# Patient Record
Sex: Female | Born: 1945
Health system: Southern US, Community
[De-identification: ages and names within clinical notes are randomized; demographics above are authoritative.]

## PROBLEM LIST (undated history)

## (undated) DIAGNOSIS — S62109A Fracture of unspecified carpal bone, unspecified wrist, initial encounter for closed fracture: Secondary | ICD-10-CM

## (undated) DIAGNOSIS — Z5189 Encounter for other specified aftercare: Secondary | ICD-10-CM

## (undated) DIAGNOSIS — S32030A Wedge compression fracture of third lumbar vertebra, initial encounter for closed fracture: Secondary | ICD-10-CM

## (undated) DIAGNOSIS — K449 Diaphragmatic hernia without obstruction or gangrene: Secondary | ICD-10-CM

## (undated) DIAGNOSIS — G4730 Sleep apnea, unspecified: Secondary | ICD-10-CM

## (undated) DIAGNOSIS — J302 Other seasonal allergic rhinitis: Secondary | ICD-10-CM

## (undated) DIAGNOSIS — I10 Essential (primary) hypertension: Secondary | ICD-10-CM

## (undated) DIAGNOSIS — K219 Gastro-esophageal reflux disease without esophagitis: Secondary | ICD-10-CM

## (undated) DIAGNOSIS — E785 Hyperlipidemia, unspecified: Secondary | ICD-10-CM

## (undated) DIAGNOSIS — Z8719 Personal history of other diseases of the digestive system: Secondary | ICD-10-CM

## (undated) DIAGNOSIS — M1990 Unspecified osteoarthritis, unspecified site: Secondary | ICD-10-CM

## (undated) DIAGNOSIS — R634 Abnormal weight loss: Secondary | ICD-10-CM

## (undated) DIAGNOSIS — M797 Fibromyalgia: Secondary | ICD-10-CM

## (undated) DIAGNOSIS — IMO0001 Reserved for inherently not codable concepts without codable children: Secondary | ICD-10-CM

## (undated) DIAGNOSIS — Z9289 Personal history of other medical treatment: Secondary | ICD-10-CM

## (undated) DIAGNOSIS — K429 Umbilical hernia without obstruction or gangrene: Secondary | ICD-10-CM

## (undated) HISTORY — DX: Unspecified osteoarthritis, unspecified site: M19.90

## (undated) HISTORY — PX: OTHER SURGICAL HISTORY: SHX169

## (undated) HISTORY — DX: Umbilical hernia without obstruction or gangrene: K42.9

## (undated) HISTORY — DX: Fracture of unspecified carpal bone, unspecified wrist, initial encounter for closed fracture: S62.109A

## (undated) HISTORY — PX: CHOLECYSTECTOMY: SHX55

## (undated) HISTORY — PX: KNEE ARTHROPLASTY: SHX992

## (undated) HISTORY — DX: Wedge compression fracture of third lumbar vertebra, initial encounter for closed fracture: S32.030A

## (undated) HISTORY — DX: Abnormal weight loss: R63.4

## (undated) HISTORY — PX: DILATION AND CURETTAGE OF UTERUS: SHX78

## (undated) HISTORY — PX: ANKLE SURGERY: SHX546

## (undated) HISTORY — DX: Essential (primary) hypertension: I10

## (undated) HISTORY — PX: JOINT REPLACEMENT: SHX530

## (undated) HISTORY — DX: Hyperlipidemia, unspecified: E78.5

---

## 1999-04-02 ENCOUNTER — Encounter: Admission: RE | Admit: 1999-04-02 | Discharge: 1999-05-01 | Payer: Self-pay | Admitting: Orthopaedic Surgery

## 1999-04-16 ENCOUNTER — Other Ambulatory Visit: Admission: RE | Admit: 1999-04-16 | Discharge: 1999-04-16 | Payer: Self-pay | Admitting: Obstetrics and Gynecology

## 1999-04-16 ENCOUNTER — Encounter (INDEPENDENT_AMBULATORY_CARE_PROVIDER_SITE_OTHER): Payer: Self-pay

## 2000-09-20 ENCOUNTER — Other Ambulatory Visit: Admission: RE | Admit: 2000-09-20 | Discharge: 2000-09-20 | Payer: Self-pay | Admitting: Obstetrics and Gynecology

## 2003-02-14 ENCOUNTER — Other Ambulatory Visit: Admission: RE | Admit: 2003-02-14 | Discharge: 2003-02-14 | Payer: Self-pay | Admitting: Obstetrics and Gynecology

## 2004-10-10 ENCOUNTER — Other Ambulatory Visit: Admission: RE | Admit: 2004-10-10 | Discharge: 2004-10-10 | Payer: Self-pay | Admitting: Obstetrics and Gynecology

## 2004-12-19 ENCOUNTER — Ambulatory Visit (HOSPITAL_COMMUNITY): Admission: RE | Admit: 2004-12-19 | Discharge: 2004-12-19 | Payer: Self-pay | Admitting: Gastroenterology

## 2004-12-19 LAB — HM COLONOSCOPY: HM COLON: NORMAL

## 2006-03-18 ENCOUNTER — Ambulatory Visit (HOSPITAL_COMMUNITY): Admission: RE | Admit: 2006-03-18 | Discharge: 2006-03-19 | Payer: Self-pay | Admitting: Orthopaedic Surgery

## 2007-12-06 ENCOUNTER — Other Ambulatory Visit: Admission: RE | Admit: 2007-12-06 | Discharge: 2007-12-06 | Payer: Self-pay | Admitting: Obstetrics & Gynecology

## 2009-03-28 ENCOUNTER — Encounter: Payer: Self-pay | Admitting: Pulmonary Disease

## 2010-04-10 ENCOUNTER — Encounter: Payer: Self-pay | Admitting: Pulmonary Disease

## 2010-04-10 ENCOUNTER — Encounter: Payer: Self-pay | Admitting: Internal Medicine

## 2010-04-10 ENCOUNTER — Ambulatory Visit (HOSPITAL_BASED_OUTPATIENT_CLINIC_OR_DEPARTMENT_OTHER)
Admission: RE | Admit: 2010-04-10 | Discharge: 2010-04-10 | Payer: Self-pay | Source: Home / Self Care | Attending: Rheumatology | Admitting: Rheumatology

## 2010-05-07 ENCOUNTER — Observation Stay (HOSPITAL_COMMUNITY)
Admission: EM | Admit: 2010-05-07 | Discharge: 2010-05-08 | Disposition: A | Discharge status: Home / Self Care | Payer: BC Managed Care – PPO | Attending: Surgery | Admitting: Surgery

## 2010-05-07 ENCOUNTER — Other Ambulatory Visit: Payer: Self-pay | Admitting: Surgery

## 2010-05-07 ENCOUNTER — Emergency Department (HOSPITAL_COMMUNITY): Payer: BC Managed Care – PPO

## 2010-05-07 DIAGNOSIS — IMO0001 Reserved for inherently not codable concepts without codable children: Secondary | ICD-10-CM | POA: Insufficient documentation

## 2010-05-07 DIAGNOSIS — G4733 Obstructive sleep apnea (adult) (pediatric): Secondary | ICD-10-CM | POA: Insufficient documentation

## 2010-05-07 DIAGNOSIS — K3580 Unspecified acute appendicitis: Principal | ICD-10-CM | POA: Insufficient documentation

## 2010-05-07 DIAGNOSIS — K219 Gastro-esophageal reflux disease without esophagitis: Secondary | ICD-10-CM | POA: Insufficient documentation

## 2010-05-07 DIAGNOSIS — I10 Essential (primary) hypertension: Secondary | ICD-10-CM | POA: Insufficient documentation

## 2010-05-07 DIAGNOSIS — Z79899 Other long term (current) drug therapy: Secondary | ICD-10-CM | POA: Insufficient documentation

## 2010-05-07 LAB — DIFFERENTIAL
Basophils Relative: 0 % (ref 0–1)
Eosinophils Absolute: 0.1 10*3/uL (ref 0.0–0.7)
Eosinophils Relative: 1 % (ref 0–5)
Lymphocytes Relative: 14 % (ref 12–46)
Lymphs Abs: 1.8 10*3/uL (ref 0.7–4.0)
Neutro Abs: 10.3 10*3/uL — ABNORMAL HIGH (ref 1.7–7.7)

## 2010-05-07 LAB — URINALYSIS, ROUTINE W REFLEX MICROSCOPIC
Hgb urine dipstick: NEGATIVE
Protein, ur: NEGATIVE mg/dL
Urine Glucose, Fasting: NEGATIVE mg/dL
Urobilinogen, UA: 1 mg/dL (ref 0.0–1.0)
pH: 6.5 (ref 5.0–8.0)

## 2010-05-07 LAB — BASIC METABOLIC PANEL
Chloride: 103 mEq/L (ref 96–112)
Creatinine, Ser: 0.76 mg/dL (ref 0.4–1.2)
GFR calc Af Amer: >60 mL/min (ref >60)
Glucose, Bld: 126 mg/dL — ABNORMAL HIGH (ref 70–99)
Potassium: 4 mEq/L (ref 3.5–5.1)
Sodium: 140 mEq/L (ref 135–145)

## 2010-05-07 LAB — HEPATIC FUNCTION PANEL
AST: 201 U/L — ABNORMAL HIGH (ref 0–37)
Alkaline Phosphatase: 97 U/L (ref 39–117)
Total Bilirubin: 0.4 mg/dL (ref 0.3–1.2)
Total Protein: 6.5 g/dL (ref 6.0–8.3)

## 2010-05-07 LAB — CBC
Hemoglobin: 11.1 g/dL — ABNORMAL LOW (ref 12.0–15.0)
Platelets: 406 10*3/uL — ABNORMAL HIGH (ref 150–400)
RBC: 3.94 MIL/uL (ref 3.87–5.11)
RDW: 15.2 % (ref 11.5–15.5)
WBC: 13 10*3/uL — ABNORMAL HIGH (ref 4.0–10.5)

## 2010-05-07 MED ORDER — IOHEXOL 300 MG/ML  SOLN
100.0000 mL | Freq: Once | INTRAMUSCULAR | Status: AC | PRN
  Administered 2010-05-07: 100 mL via INTRAVENOUS

## 2010-05-10 NOTE — H&P (Signed)
NAME:  Miranda Romero, Miranda Romero            ACCOUNT NO.:  [id number]  MEDICAL RECORD NO.:  [id number]           PATIENT TYPE:  E  LOCATION:  [facility]                         FACILITY:  [facility]  PHYSICIAN:  Abigail Miyamoto, M.D. DATE OF BIRTH:  07-12-1945  DATE OF ADMISSION:  05/07/2010 DATE OF DISCHARGE:                             HISTORY & PHYSICAL   CHIEF COMPLAINT:  Abdominal pain.  REQUESTING PHYSICIAN:  Orlene Och, MD, ER  PRIMARY CARE PHYSICIAN:  Gaspar Garbe, MD  BRIEF HISTORY:  The patient is a 65 year old female who developed right lower quadrant pain after getting off her exercise bike last evening. She noted pain with walking, deep inspiration, or changing her position. She describes it as a sharp pain, it lasted 5 hours.  Nothing really helped except lying still.  She has never had any problems like this before.  No nausea, vomiting, or diarrhea.  She had a bowel movement, last BM around 7:30 or 8 o'clock after the pain started, did not make her symptoms any better, questionably little more discomfort. Currently, she continues to have discomfort, although has been relieved with Dilaudid.  PAST MEDICAL HISTORY: 1. Obstructive sleep apnea study April 10, 2010, positive for apnea. 2. Status post cholecystectomy 15 years ago. 3. Fibromyalgia. 4. Hypertension. 5. GERD. 6. Seasonal allergies. 7. She has history of bladder issues. 8. Osteoarthritis. 9. Depression. 10.BMI of 35.5, she is 6 feet and 3 inches tall, and 220 pounds.  PAST SURGICAL HISTORY: 1. Bimalleolar left ankle fracture repair. 2. Colonoscopy October 2006 shows internal hemorrhoids, early sigmoid     diverticulosis, normal transverse right colon, and cecum. 3. She is status post 2 bilateral knee replacements last year and she     is still using physical therapy for at least one.  FAMILY HISTORY:  Mother died at 3 with lymphoma.  Father died at 3, not sure, but then he was a heavy smoker.   Brother is in good health. One sister and both siblings are in their 63s.  She has 2 children in good health.  SOCIAL HISTORY:  Tobacco:  None.  Alcohol:  None.  Drugs:  None.  She is a retired Doctor, general practice.  Married. Lives with her husband.  He is with her currently.  REVIEW OF SYSTEMS:  FEVER:  None.  SKIN CHANGES:  None.  Weight down secondary to exercise and diet.  CEREBROVASCULAR:  Negative.  PULMONARY: She sleeps poorly.  Has sleep apnea.  Has completed the study.  She has not talked to anyone about the results of the study.  No dyspnea on exertion.  No recent URI.  No coughing or wheezing.  CARDIAC:  No history of prior chest pain or palpitation.  She has never had a cardiac evaluation.  GI:  Negative for GERD.  No nausea, vomiting, diarrhea, constipation, or blood.  GU:  Negative.  LOWER EXTREMITIES:  She gets some edema, usually goes down at night.  Both knees hurt. MUSCULOSKELETAL:  Positive for osteoarthritis.  PSYCH:  No changes.  CURRENT MEDICATIONS:  Caduet, gabapentin, Nexium, aspirin, Vesicare, Cymbalta, CoQ10, calcium, vitamin D, Ultram; doses are not currently available.  ALLERGIES:  None.  She is intolerant to SUDAFED, it makes her hyper.  PHYSICAL EXAMINATION:  VITAL SIGNS:  On admission, her temperature is 98.2, blood pressure is 139/94, respiratory rate was 18, last blood pressure 131/84, temperature remains at 98.8, heart rate 96, respiratory rate is 20.  She has had two doses of Dilaudid, one dose of Zofran, and one dose of Toradol. GENERAL:  Well-nourished, well-developed white female.  She has pain with movement, but is in no acute distress, also pain with deep inspiration on exam. HEAD:  Normocephalic. EARS, NOSE, THROAT, AND MOUTH:  Within normal limits.  Normal mucosa. NECK:  Trachea is in the midline.  There is no palpable thyromegaly.  No bruits.  No JVD. RESPIRATORY:  Normal effort. CHEST:  Clear to auscultation.  Nontender to  palpation. CARDIAC:  Normal S1 and S2.  No murmur or rub was heard. EXTREMITIES:  Pulses are +2 and equal both upper and lower extremities. ABDOMEN:  Normal bowel sounds, nondistended, nontender.  No hernias, no masses. GENITALIA:  Deferred. RECTAL:  Deferred. MUSCULOSKELETAL:  Normal muscle tone.  Status post bilateral knee replacement. SKIN:  No changes. NEUROLOGIC:  No focal deficits.  Cranial nerves appear intact. PSYCH:  Normal affect.  LABORATORY DATA:  Sodium is 140, potassium is 4, chloride is 103, CO2 is 29, BUN is 15, creatinine 0.76, glucose is 126.  White count is 13,000, hemoglobin 11, hematocrit 33.9, platelets were normal at 406.  UA was negative.  Protime, PTT, amylase, lipase, and liver functions are pending.  CT exam shows inflammation right lower quadrant, appendix is dilated at 13 mm with several appendicoliths.  There is peripheral stranding.  No abscess was seen.  There is also a small hiatal hernia.  IMPRESSION: 1. Acute appendicitis with elevated white count. 2. Obstructive sleep apnea. 3. Fibromyalgia. 4. Hypertension. 5. Gastroesophageal reflux disease. 6. Osteoarthritis both knees. 7. Depression. 8. BMI of 35.5.  PLAN:  The patient was seen and evaluated by Dr. Magnus Ivan.  She was started on antibiotics and we will schedule for an elective appendectomy hopefully done laparoscopically later today.     Eber Hong, P.A.   ______________________________ Abigail Miyamoto, M.D.    WDJ/MEDQ  D:  05/07/2010  T:  05/07/2010  Job:  [id number]  cc:   Gaspar Garbe, M.D.  Electronically Signed by Sherrie George P.A. on 05/09/2010 10:55:23 AM Electronically Signed by Abigail Miyamoto M.D. on 05/10/2010 04:08:54 PM

## 2010-05-10 NOTE — Op Note (Signed)
  NAME:  [name], Miranda            ACCOUNT NO.:  [id number]  MEDICAL RECORD NO.:  [id number]           PATIENT TYPE:  I  LOCATION:  5125                         FACILITY:  [facility]  PHYSICIAN:  Abigail Miyamoto, M.D. DATE OF BIRTH:  1945-07-22  DATE OF PROCEDURE:  05/07/2010 DATE OF DISCHARGE:                              OPERATIVE REPORT   PREOPERATIVE DIAGNOSIS:  Acute appendicitis.  POSTOPERATIVE DIAGNOSIS:  Acute appendicitis.  PROCEDURE:  Laparoscopic appendectomy.  SURGEON:  Abigail Miyamoto, MD  ANESTHESIA:  General endotracheal anesthesia.  ESTIMATED BLOOD LOSS:  Minimal.  FINDINGS:  The patient found to have acute appendicitis which was slightly retrocecal.  There was no evidence of perforation.  PROCEDURE IN DETAIL:  The patient was brought to the operating room, identified as Miranda Romero.  She was placed supine on the operating table and general anesthesia was induced.  Her abdomen was then prepped and draped in usual sterile fashion.  Using a #15 blade, a small vertical incision was made above the umbilicus.  This was carried down to the fascia which was then opened with a scalpel.  Hemostat was then used to pass through the peritoneal cavity under direct vision.  Next, a 5-mm port was placed in the patient's right upper quadrant and another was placed in the patient's left lower quadrant, both under direct vision.  The appendix was found to be slightly retrocecal but was stuck to the abdominal wall.  I was able to easily peel it off the abdominal wall.  The appendix came off the base of the cecum and then curved retrocecal.  I was able to actually elevate the base of the appendix first and transect this with the endoscopic GIA stapler.  I then took down the mesoappendix off the side of the colon with the Harmonic scalpel.  Once the appendix was completely free, it was placed in an endosac and removed it through the incision at the umbilicus.  I  then thoroughly irrigated the right lower quadrant with normal saline.  I evaluated the mesentery and staple line and hemostasis appeared to be achieved.  All ports were removed under direct vision and the abdomen was deflated.  All incisions were then anesthetized with Marcaine.  The 0 Vicryl at the umbilicus was tied in place closing the fascial defect. All incisions were then closed with 4-0 Monocryl subcuticular sutures.  Steri-Strips and Band-Aids were then applied. The patient tolerated the procedure well.  All counts were correct at the end of the procedure.  The patient was then extubated in the operating room and taken in stable condition to recovery room.     Abigail Miyamoto, M.D.     DB/MEDQ  D:  05/07/2010  T:  05/08/2010  Job:  [id number]  Electronically Signed by Abigail Miyamoto M.D. on 05/10/2010 04:08:57 PM

## 2010-05-14 ENCOUNTER — Encounter: Payer: Self-pay | Admitting: Pulmonary Disease

## 2010-05-14 ENCOUNTER — Institutional Professional Consult (permissible substitution) (INDEPENDENT_AMBULATORY_CARE_PROVIDER_SITE_OTHER): Payer: BC Managed Care – PPO | Admitting: Pulmonary Disease

## 2010-05-14 DIAGNOSIS — G4730 Sleep apnea, unspecified: Secondary | ICD-10-CM

## 2010-05-14 DIAGNOSIS — I10 Essential (primary) hypertension: Secondary | ICD-10-CM | POA: Insufficient documentation

## 2010-05-21 ENCOUNTER — Telehealth (INDEPENDENT_AMBULATORY_CARE_PROVIDER_SITE_OTHER): Payer: Self-pay | Admitting: *Deleted

## 2010-05-22 NOTE — Assessment & Plan Note (Signed)
Summary: sleep apnea//Miranda Romero   Visit Type:  Initial Consult Primary Provider/Referring Provider:  Dr. Wylene Simmer  CC:  Pt here to go over sleep study results.  History of Present Illness: 65/F for evaluation of obstructive sleep apnea  She sees Dr Corliss Skains at [facility] -fibromyalgia & BL knee replacements witnessed apneas by husband , more on her back, ER visit for appendicitis , O2 satn dropped to 65% Nocturia +, excessive daytime somnolence & fatigue Epworth Sleepiness Score 6/24 Some sleep onset insomnia, has used ambien in the past, bedtime MN, oob 1000 , 2-3 awakenings No recent weight gain Reviewed PSG >> showed severe obstructive sleep apnea with AHi 72/h, predominant hypopneas , desatn to 80%, significant PLMs assocaited with arousals There is no history suggestive of cataplexy, sleep paralysis or parasomnias   Preventive Screening-Counseling & Management  Alcohol-Tobacco     Smoking Status: never   History of Present Illness: awake 2-3 times a night, snoring  What time do you typically go to bed?(between what hours): 12:00  How long does it take you to fall asleep? 15-20 minutes  How many times during the night do you wake up? 2-5 times  What time do you get out of bed to start your day? 9:30-10:00  Do you drive or operate heavy machinery in your occupation? no  How much has your weight changed (up or down) over the past two years? (in pounds): hasn't  Have you ever had a sleep study before?  If yes,when and where: Yes  Do you currently use CPAP ? If so , at what pressure? no  Do you wear oxygen at any time? If yes, how many liters per minute? no Current Medications (verified): 1)  Allegra Allergy 180 Mg Tabs (Fexofenadine Hcl) .... Take 1 Tablet By Mouth Once A Day 2)  Cymbalta 30 Mg Cpep (Duloxetine Hcl) .... Take 1 Tablet By Mouth Once A Day 3)  Gabapentin 300 Mg Caps (Gabapentin) .... Take 1 Tablet By Mouth Once A Day 4)  Celebrex 200 Mg Caps (Celecoxib) .... Take 1  Tablet By Mouth Once A Day 5)  Nexium 40 Mg Cpdr (Esomeprazole Magnesium) .... Take 1 Tablet By Mouth Once A Day 6)  Tramadol Hcl 50 Mg Tabs (Tramadol Hcl) .... Take 2 Tablet By Mouth Every Morning 7)  Vesicare 5 Mg Tabs (Solifenacin Succinate) .... Take 1 Tablet By Mouth Once A Day 8)  Caduet 10-10 Mg Tabs (Amlodipine-Atorvastatin) .... Take 1 Tablet By Mouth Once A Day 9)  Calcium 500 Mg Tabs (Calcium) .... Take 1 Tablet By Mouth Three Times A Day 10)  Vitamin D 400 Unit Caps (Cholecalciferol) .... Take 1 Tablet By Mouth Once A Day 11)  Magnesium 500 Mg Tabs (Magnesium) .... Take 1 Tablet By Mouth Three Times A Day 12)  Vitamin B-1 100 Mg Tabs (Thiamine Hcl) .... Take 1 Tablet By Mouth Once A Day 13)  Coq-10 30 Mg Caps (Coenzyme Q10) .... 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  Smoking Status:  never  Review of Systems       The patient complains of acid heartburn, indigestion, difficulty swallowing,  hand/feet swelling, and joint stiffness or pain.  The patient denies shortness of breath with activity, shortness of breath at rest, productive cough, non-productive cough, coughing up blood, chest pain, irregular heartbeats, loss of appetite, weight change, abdominal pain, sore throat, tooth/dental problems, headaches, nasal congestion/difficulty breathing through nose, sneezing, itching, ear ache, anxiety, depression, rash, change in color of mucus, and fever.      Vital Signs:  Patient profile:   65 year old female Height:      66 inches Weight:      223.6 pounds BMI:     36.22 O2 Sat:      98 % on Room air Temp:     98.2 degrees F oral Pulse rate:   86 / minute BP sitting:   106 / 62  (left arm) Cuff size:   large  Vitals Entered By: Zackery Barefoot CMA (May 14, 2010 3:30 PM)  O2 Flow:  Room air CC: Pt here to go over sleep study results Comments Medications reviewed with patient Verified contact number and pharmacy with patient Zackery Barefoot CMA  May 14, 2010 3:30 PM    Physical Exam  Additional Exam:  Gen. Pleasant, well-nourished, in no distress, normal affect ENT - no lesions, no post nasal drip, class 2 airway Neck: No JVD, no thyromegaly, no carotid bruits Lungs: no use of accessory muscles, no dullness to percussion, clear without rales or rhonchi  Cardiovascular: Rhythm regular, heart sounds  normal, no murmurs or gallops, no peripheral edema Abdomen: soft and non-tender, no hepatosplenomegaly, BS normal. Musculoskeletal: No deformities, no cyanosis or clubbing Neuro:  alert, non focal     Impression & Recommendations:  Problem # 1:  OBSTRUCTIVE SLEEP APNEA (ICD-780.57) The pathophysiology of obstructive sleep apnea, it's cardiovascular consequences and modes of treatment including CPAP were discussed with the patient in great detail. Given severity of obstructive sleep apnea , will initiate autoCPAP 5-10 cm with nasal pillows & humidity, obtain download in 2 weeks & titrate up if needed Due to presence of leg movements & to obtai optimal titration, will also schedule in lab CPAP titration Compliance encouraged, wt loss emphasized, asked to avoid meds with sedative side effects, cautioned against driving when sleepy.  Nocturia may be related Orders: Consultation Level IV (16109) Sleep Study (Sleep Study) DME Referral (DME)  Medications Added to Medication List This Visit: 1)  Allegra Allergy 180 Mg Tabs  (Fexofenadine hcl) .... Take 1 tablet by mouth once a day 2)  Cymbalta 30 Mg Cpep (Duloxetine hcl) .... Take 1 tablet by mouth once a day 3)  Gabapentin 300 Mg Caps (Gabapentin) .... Take 1 tablet by mouth once a day 4)  Celebrex 200 Mg Caps (Celecoxib) .... Take 1 tablet by mouth once a day 5)  Nexium 40 Mg Cpdr (Esomeprazole magnesium) .... Take 1 tablet by mouth once a day 6)  Tramadol Hcl 50 Mg Tabs (Tramadol hcl) .... Take 2 tablet by mouth every morning 7)  Vesicare 5 Mg Tabs (Solifenacin succinate) .... Take 1 tablet by mouth once a day 8)  Caduet 10-10 Mg Tabs (Amlodipine-atorvastatin) .... Take 1 tablet by mouth once a day 9)  Calcium 500 Mg Tabs (Calcium) .... Take 1 tablet by mouth three times a day 10)  Vitamin D 400 Unit Caps (Cholecalciferol) .... Take 1 tablet by mouth once a day 11)  Magnesium 500 Mg Tabs (Magnesium) .... Take 1 tablet by mouth three times a day 12)  Vitamin B-1 100 Mg Tabs (Thiamine hcl) .Marland KitchenMarland KitchenMarland Kitchen  Take 1 tablet by mouth once a day 13)  Coq-10 30 Mg Caps (Coenzyme q10) .... 160mg  daily  Patient Instructions: 1)  Please schedule a follow-up appointment in 1 month 2)  CPAP titration study  3)  Will set you up with an autoCPAP   Immunization History:  Influenza Immunization History:    Influenza:  historical (11/18/2009)  Pneumovax Immunization History:    Pneumovax:  historical (04/16/2008)

## 2010-05-27 NOTE — Letter (Signed)
Summary: [facility]  [facility]   Imported By: Sherian Rein 05/23/2010 07:29:20  _____________________________________________________________________  External Attachment:    Type:   Image     Comment:   External Document

## 2010-05-27 NOTE — Progress Notes (Signed)
Summary: has some medicare questions for libbby  Phone Note Call from Patient   Caller: Patient Call For: ALVA Summary of Call: Ms Prien phoned and wanted to speak to [facility] regarding some Medicare questions. She can be reached at [phone] Initial call taken by: Vedia Coffer,  May 21, 2010 10:22 AM  Follow-up for Phone Call        discussed with pt bcbs vs medicare guidelines for osa  Follow-up by: Oneita Jolly,  May 21, 2010 12:49 PM

## 2010-05-28 ENCOUNTER — Ambulatory Visit (HOSPITAL_BASED_OUTPATIENT_CLINIC_OR_DEPARTMENT_OTHER): Payer: BC Managed Care – PPO | Attending: Pulmonary Disease

## 2010-05-28 DIAGNOSIS — Z96659 Presence of unspecified artificial knee joint: Secondary | ICD-10-CM | POA: Insufficient documentation

## 2010-05-28 DIAGNOSIS — G4761 Periodic limb movement disorder: Secondary | ICD-10-CM | POA: Insufficient documentation

## 2010-05-28 DIAGNOSIS — IMO0001 Reserved for inherently not codable concepts without codable children: Secondary | ICD-10-CM | POA: Insufficient documentation

## 2010-05-28 DIAGNOSIS — R0989 Other specified symptoms and signs involving the circulatory and respiratory systems: Secondary | ICD-10-CM | POA: Insufficient documentation

## 2010-05-28 DIAGNOSIS — Z79899 Other long term (current) drug therapy: Secondary | ICD-10-CM | POA: Insufficient documentation

## 2010-05-28 DIAGNOSIS — G4733 Obstructive sleep apnea (adult) (pediatric): Secondary | ICD-10-CM | POA: Insufficient documentation

## 2010-05-28 DIAGNOSIS — R0609 Other forms of dyspnea: Secondary | ICD-10-CM | POA: Insufficient documentation

## 2010-06-02 DIAGNOSIS — R0609 Other forms of dyspnea: Secondary | ICD-10-CM

## 2010-06-02 DIAGNOSIS — G4733 Obstructive sleep apnea (adult) (pediatric): Secondary | ICD-10-CM

## 2010-06-02 DIAGNOSIS — G4761 Periodic limb movement disorder: Secondary | ICD-10-CM

## 2010-06-02 DIAGNOSIS — R0989 Other specified symptoms and signs involving the circulatory and respiratory systems: Secondary | ICD-10-CM

## 2010-06-02 DIAGNOSIS — IMO0001 Reserved for inherently not codable concepts without codable children: Secondary | ICD-10-CM

## 2010-06-10 ENCOUNTER — Telehealth: Payer: Self-pay | Admitting: Pulmonary Disease

## 2010-06-10 DIAGNOSIS — G4733 Obstructive sleep apnea (adult) (pediatric): Secondary | ICD-10-CM

## 2010-06-10 NOTE — Telephone Encounter (Signed)
Pl let her know  2 week download shows good control of events >> change to fixed pressure 12 cm & rpt download in 4 weeks

## 2010-06-11 ENCOUNTER — Encounter: Payer: Self-pay | Admitting: Pulmonary Disease

## 2010-06-13 NOTE — Telephone Encounter (Signed)
Pt informed of RA's recs. Hubert Raatz W. Jashan Cotten, CMA  

## 2010-06-16 ENCOUNTER — Ambulatory Visit (INDEPENDENT_AMBULATORY_CARE_PROVIDER_SITE_OTHER): Payer: BC Managed Care – PPO | Admitting: Pulmonary Disease

## 2010-06-16 ENCOUNTER — Other Ambulatory Visit (INDEPENDENT_AMBULATORY_CARE_PROVIDER_SITE_OTHER): Payer: BC Managed Care – PPO

## 2010-06-16 ENCOUNTER — Other Ambulatory Visit (INDEPENDENT_AMBULATORY_CARE_PROVIDER_SITE_OTHER): Payer: BC Managed Care – PPO | Admitting: Pulmonary Disease

## 2010-06-16 ENCOUNTER — Encounter: Payer: Self-pay | Admitting: Pulmonary Disease

## 2010-06-16 VITALS — BP 104/70 | HR 104 | Temp 98.9°F | Ht 66.0 in | Wt 225.2 lb

## 2010-06-16 DIAGNOSIS — G2581 Restless legs syndrome: Secondary | ICD-10-CM

## 2010-06-16 DIAGNOSIS — G4730 Sleep apnea, unspecified: Secondary | ICD-10-CM

## 2010-06-16 LAB — IBC PANEL
Saturation Ratios: 8.2 % — ABNORMAL LOW (ref 20.0–50.0)
Transferrin: 253.3 mg/dL (ref 212.0–360.0)

## 2010-06-16 LAB — FERRITIN: Ferritin: 58.9 ng/mL (ref 10.0–291.0)

## 2010-06-16 NOTE — Progress Notes (Signed)
  Subjective:    Patient ID: Miranda Romero, female    DOB: April 26, 1945, 65 y.o.   MRN: [id number]  HPI 64/F for FU of obstructive sleep apnea  She sees Dr Corliss Skains at [facility] -fibromyalgia & BL knee replacements  witnessed apneas by husband , more on her back, ER visit for appendicitis , O2 satn dropped to 65%  Nocturia +, excessive daytime somnolence & fatigue  Epworth Sleepiness Score 6/24  Some sleep onset insomnia, has used ambien in the past, bedtime MN, oob 1000 , 2-3 awakenings  No recent weight gain  Reviewed PSG >> showed severe obstructive sleep apnea with AHi 72/h, predominant hypopneas , desatn to 80%, significant PLMs assocaited with arousals  06/16/2010 Started on autoCPAP 5-10 >>2 week download shows good control of events >> avg pressure 12 cm  Surprisingly PSG showed good control on 7 cm, 1 L O2 was blended due to persistent desatn without resp events. Significant PLMs associated with arousals were noted during both baseline & titration study fe level & satn low although ferritin ok She has tolerated CPAP well, Not fully refreshed but decreased somnolence       Review of Systems Pt denies any significant  nasal congestion or excess secretions, fever, chills, sweats, unintended wt loss, pleuritic or exertional cp, orthopnea pnd or leg swelling.  Pt also denies any obvious fluctuation in symptoms with weather or environmental change or other alleviating or aggravating factors.    Pt denies any increase in rescue therapy over baseline, denies waking up needing it or having early am exacerbations or coughing/wheezing/ or dyspnea       Objective:   Physical ExamGen. Pleasant, well-nourished, in no distress ENT - no lesions, no post nasal drip Neck: No JVD, no thyromegaly, no carotid bruits Lungs: no use of accessory muscles, no dullness to percussion, clear without rales or rhonchi  Cardiovascular: Rhythm regular, heart sounds  normal, no murmurs or gallops, no  peripheral edema Musculoskeletal: No deformities, no cyanosis or clubbing          Assessment & Plan:

## 2010-06-16 NOTE — Assessment & Plan Note (Addendum)
Surprisingly, CPAP titration study showed requirement of 7 cm with 1 L O2 2 week download suggests 12 cm Await 4 wk download before changing to fixed pressure, will initiate O2

## 2010-06-16 NOTE — Patient Instructions (Signed)
Blood work for iron levels Download in 4 weeks - change to fixed pressure We will send in orders to [facility] - to add oxygen to your CPAP machine

## 2010-06-25 ENCOUNTER — Telehealth: Payer: Self-pay | Admitting: Pulmonary Disease

## 2010-06-25 NOTE — Telephone Encounter (Signed)
Spoke w/ pt and she states she has not heard from anyone regarding her o2 w/ her cpap. Spoke w/ [facility] and they state that someone should be contacting her no later than today about getting this set up. Advised pt of this and she states nothing else was needed

## 2010-06-26 ENCOUNTER — Encounter: Payer: Self-pay | Admitting: Pulmonary Disease

## 2010-06-27 ENCOUNTER — Telehealth: Payer: Self-pay | Admitting: Pulmonary Disease

## 2010-06-27 NOTE — Telephone Encounter (Signed)
Pt aware the oximerty results on npsg was over 65days old and can not be used to qualify the 02 so she needs to do the ono to get qyalified for the 02 [facility]

## 2010-06-27 NOTE — Telephone Encounter (Signed)
Pt states she still has not received O2 for cpap but did receive oximeter to qualify her for O2. I will send to [facility] so they may call [facility] regarding this matter. Pt needs 1 lpm bled into cpap and 4 week download.

## 2010-07-07 ENCOUNTER — Telehealth: Payer: Self-pay | Admitting: Pulmonary Disease

## 2010-07-07 NOTE — Telephone Encounter (Signed)
Reviewed ONO on CPAP 07/01/10 >> less than 5 min saturation < 88% OK to observe without O2 (on CPAP alone) & see how much improvement

## 2010-07-09 ENCOUNTER — Encounter: Payer: Self-pay | Admitting: Pulmonary Disease

## 2010-07-11 ENCOUNTER — Other Ambulatory Visit: Payer: Self-pay | Admitting: Gastroenterology

## 2010-07-11 DIAGNOSIS — K219 Gastro-esophageal reflux disease without esophagitis: Secondary | ICD-10-CM

## 2010-07-15 ENCOUNTER — Telehealth: Payer: Self-pay | Admitting: Pulmonary Disease

## 2010-07-15 NOTE — Telephone Encounter (Signed)
Pt made aware and had verbal understanding.

## 2010-07-15 NOTE — Telephone Encounter (Signed)
No new recommendations No other meds required

## 2010-07-15 NOTE — Telephone Encounter (Signed)
Oretha Milch., MD 07/07/2010 12:15 PM Signed  Reviewed ONO on CPAP 07/01/10 >> less than 5 min saturation < 88%  OK to observe without O2 (on CPAP alone) & see how much improvement   Spoke with pt.  States she cancelled her appt with RA because she still had not heard anything regarding the results of ONO on RA and cpap.  States she now is leaving for [place] on Saturday and would like to know something regarding these results.  I informed her of above results and recs and she verbalized understanding of this.  Pt also states she was told she does not go into a deep sleep bc her legs are restless.  States she is concerned about this because she would like to get into a deep sleep to see if this makes a difference.  States since she has been on cpap, she has not noticed any difference and would like to see if going into a deep sleep would.  Will forward message to RA to address.

## 2010-07-17 ENCOUNTER — Ambulatory Visit
Admission: RE | Admit: 2010-07-17 | Discharge: 2010-07-17 | Disposition: A | Discharge status: Home / Self Care | Payer: BC Managed Care – PPO | Source: Ambulatory Visit | Attending: Gastroenterology | Admitting: Gastroenterology

## 2010-07-17 ENCOUNTER — Ambulatory Visit: Payer: BC Managed Care – PPO | Admitting: Pulmonary Disease

## 2010-07-17 DIAGNOSIS — K219 Gastro-esophageal reflux disease without esophagitis: Secondary | ICD-10-CM

## 2010-07-22 NOTE — Telephone Encounter (Signed)
I informed pt of RA's findings and recommendations. Pt verbalized understanding  

## 2010-08-01 NOTE — Op Note (Signed)
NAME:  Miranda Romero, Miranda Romero            ACCOUNT NO.:  [id number]   MEDICAL RECORD NO.:  [id number]          PATIENT TYPE:  AMB   LOCATION:  ENDO                         FACILITY:  [facility]   PHYSICIAN:  Anselmo Rod, M.D.  DATE OF BIRTH:  August 24, 1945   DATE OF PROCEDURE:  12/19/2004  DATE OF DISCHARGE:                                 OPERATIVE REPORT   PROCEDURE PERFORMED:  Screening colonoscopy.   ENDOSCOPIST:  Anselmo Rod, M.D.   INSTRUMENT USED:  Olympus video colonoscope.   INDICATIONS FOR PROCEDURE:  A 65 year old white female undergoing screening  colonoscopy, rule out colonic polyps, masses, etc.   PREPROCEDURE PREPARATION:  Informed consent was procured from the patient.  The patient was fasted for eight hours prior to the procedure and prepped  with a bottle of magnesium citrate and a gallon of GoLYTELY the night prior  to the procedure.  Risks and benefits of the procedure including a 10% miss  rate of cancer and polyps was discussed with the her as well.   PREPROCEDURE PHYSICAL:  VITAL SIGNS:  Stable vital signs.  NECK:  Supple.  CHEST:  Clear to auscultation.  CARDIOVASCULAR:  S1 and S2 regular.  ABDOMEN:  Soft with normal bowel sounds.   DESCRIPTION OF PROCEDURE:  The patient was placed in left lateral decubitus  position, sedated with 100 mg of Demerol and 10 mg of Versed in slow  incremental doses.  Once the patient was adequately sedated and maintained  on low flow oxygen and continuous cardiac monitoring, the Olympus video  colonoscope was advanced from the rectum to the cecum.  The appendiceal  orifice and ileocecal valve were visualized and photographed.  The patient  had a somewhat tortuous colon with significant amount of residual stool in  the colon.  Multiple washings were done.  Few early sigmoid diverticula were  seen.  Small internal hemorrhoids were appreciated on retroflexion in the  rectum.  No masses or polyps were identified.  The appendiceal  orifice and  ileocecal valve were clearly visualized and photographed.  The terminal  ileum was briefly visualized and appeared normal.   IMPRESSION:  1.  Small nonbleeding internal hemorrhoids.  2.  Early sigmoid diverticulosis.  3.  Normal-appearing transverse colon, right colon and cecum including      terminal ileum.   RECOMMENDATIONS:  1.  Repeat colonoscopy recommended in the next 10 years unless the patient      develops any abnormal symptoms in      the interim.  2.  Outpatient follow-up as needed arises in the future. Brochures on      diverticulosis and high fiber diet have been given to the patient for      education.      Anselmo Rod, M.D.  Electronically Signed     JNM/MEDQ  D:  12/19/2004  T:  12/19/2004  Job:  [id number]   cc:   Edwena Felty. Romine, M.D.  Fax: [phone]   Pollyann Savoy, M.D.  Fax: [phone]   Vale Haven. Andrey Campanile, M.D.  Fax: [phone]

## 2010-08-01 NOTE — Op Note (Signed)
NAME:  Miranda Romero, Miranda Romero            ACCOUNT NO.:  [id number]   MEDICAL RECORD NO.:  [id number]          PATIENT TYPE:  AMB   LOCATION:  SDS                          FACILITY:  [facility]   PHYSICIAN:  Claude Manges. Cleophas Dunker, M.D.DATE OF BIRTH:  April 19, 1945   DATE OF PROCEDURE:  03/17/2005  DATE OF DISCHARGE:                               OPERATIVE REPORT   PREOPERATIVE DIAGNOSIS:  Displaced bimalleolar fracture left ankle.   POSTOPERATIVE DIAGNOSIS:  Displaced bimalleolar fracture left ankle.   PROCEDURE:  Open reduction internal fixation of bimalleolar fracture,  left ankle.   SURGEON:  Dr. Cleophas Dunker   ASSISTANT:  Rexene Edison, [facility]   ANESTHESIA:  General orotracheal.   COMPLICATIONS:  None.   PROCEDURE:  With the patient comfortable on the operating table and  under general orotracheal anesthesia, left lower extremity was placed in  a thigh tourniquet.  The leg was then prepped with Betadine scrub and  DuraPrep from the tips of the toes to the knee.  Sterile draping was  performed with the extremity still elevated.  It was Esmarch-  exsanguinated with a proximal tourniquet at 350 mmHg.   Initial procedure was performed with open reduction, internal fixation  of the lateral malleolus.  A longitudinal incision was made just over 3  inches in length over the distal fibula via sharp dissection, carried  down to the subcutaneous tissue.  Then via blunt dissection, the soft  tissue was elevated from the distal fibular fracture.  The peroneal  nerve branch was identified and carefully retracted out of the operative  field.  The fracture was identified as a long, oblique fracture  extending posteriorly, proximally, distally, anteriorly.  There was  considerable offset and shortening.  Under direct visualization, the  fracture was reduced and maintained with a bone clamp.  A single screw  was then placed from anterior to posterior at about a 9-degree angle to  the fracture site.  This was  drilled, measured, and filled with a self-  tapping cortical screw.  The seven hole semitubular side plate was then  affixed to the distal fibula after bending it to conform to the shape of  the fibula.  The 2 distal holes were filled with cancellus screws, the  proximal 4 holes with self-tapping cortical screws.  We checked image  intensification and felt that all the screws were well within the bone  and all but the next to last screw had excellent purchase.  The wounds  were then irrigated with saline solution.  The fascia and subcu were  closed with 3-0 Vicryl, skin with skin clips.  We infiltrated the area  of 0.25% Marcaine without epinephrine.   A second procedure was performed to ORIF the medial malleolus.  An  oblique incision was made over the medial malleolus and via sharp  dissection, carried down to the subcutaneous tissue and abundant adipose  tissue.  The fracture was identified and was obviously displaced.  The  periosteum was invaginated in the fracture site.  These were just  elevated from the fracture site with a Freer.  Under direct  visualization, we reduced the  fracture, maintained it with a K-wire.  Two 3.5 mm cancellus screws with washers were then applied.  A guide pin  was then inserted.  The first screw measured approximately 50 mm in  length and was a 4.0 self-tapping screw.  We used a washer.  The second  was also placed over a K-wire.  We had excellent position, very nice  purchase, image intensification revealed that the second screw which was  a little bit more anterior was just about 2 mm long, and I was concerned  about possible posterior tibial tendon irritation, so this was removed,  and we inserted a 46 mm screw.  Image intensification revealed that it  was well within the bone.  We had very nice purchase and good  compression.  The K-wire was then removed from the fracture site.  It  would not move.  The wound was irrigated with saline solution, the   fascia and periosteum closed with 3-0 Vicryl, skin closed with skin  clips.  This wound was also infiltrated with Marcaine without  epinephrine.  A sterile bulky dressing was applied followed by Webril  and short leg posterior splints.  Tourniquet was deflated with immediate  capillary refill to the toes.   The patient tolerated the procedure without complications.   PLAN:  Twenty-four hour observation out of the bed the morning with  physical therapy.      Claude Manges. Cleophas Dunker, M.D.  Electronically Signed     PWW/MEDQ  D:  03/18/2006  T:  03/18/2006  Job:  [id number]

## 2010-10-09 ENCOUNTER — Telehealth (INDEPENDENT_AMBULATORY_CARE_PROVIDER_SITE_OTHER): Payer: Self-pay

## 2010-10-09 NOTE — Telephone Encounter (Signed)
Patient called and is concerned that she may have a hernia at her previous appendix incision.  [place] town next week and wants to see if she can be seen by Dr Magnus Ivan next week.  I told her that Miranda Romero can look at the schedule with Dr Magnus Ivan and call her back.  She had questions about exercise and I told her walking and bicycle riding are ok but to refrain from heavy lifting until she sees the doctor.

## 2010-10-10 NOTE — Telephone Encounter (Signed)
Pt has a appt to come in to see Dr Magnus Ivan on 10-16-10 Pattricia Boss

## 2010-10-14 ENCOUNTER — Encounter (INDEPENDENT_AMBULATORY_CARE_PROVIDER_SITE_OTHER): Payer: Self-pay | Admitting: Surgery

## 2010-10-16 ENCOUNTER — Encounter (INDEPENDENT_AMBULATORY_CARE_PROVIDER_SITE_OTHER): Payer: Self-pay | Admitting: Surgery

## 2010-10-16 ENCOUNTER — Ambulatory Visit (INDEPENDENT_AMBULATORY_CARE_PROVIDER_SITE_OTHER): Payer: BC Managed Care – PPO | Admitting: Surgery

## 2010-10-16 VITALS — BP 136/88 | HR 84 | Temp 96.6°F | Ht 66.0 in | Wt 216.6 lb

## 2010-10-16 DIAGNOSIS — K432 Incisional hernia without obstruction or gangrene: Secondary | ICD-10-CM | POA: Insufficient documentation

## 2010-10-16 NOTE — Progress Notes (Signed)
Miranda Romero is a 65 y.o. female.    Chief Complaint  Patient presents with  . Incisional Hernia    HPI HPI This is a pleasant 65 year old female that I performed a laparoscopic appendectomy on earlier this year. She now presents with a bulge at her umbilicus which is mildly uncomfortable. She has had no obstructive symptoms. She has no complaints.  Past Medical History  Diagnosis Date  . Hypertension   . Umbilical hernia   . Weight decrease   . Hyperlipidemia   . Arthritis     Past Surgical History  Procedure Date  . Appendectomy   . Cholecystectomy   . Knee arthroplasty   . Ankle surgery     Family History  Problem Relation Age of Onset  . Lymphoma Mother     Social History History  Substance Use Topics  . Smoking status: Never Smoker   . Smokeless tobacco: Not on file  . Alcohol Use: No    Allergies  Allergen Reactions  . Sudafed (Pseudoephedrine Hcl)     Current Outpatient Prescriptions  Medication Sig Dispense Refill  . amlodipine-atorvastatin (CADUET) 10-10 MG per tablet Take 1 tablet by mouth daily.        . calcium gluconate 500 MG tablet Take 500 mg by mouth 3 (three) times daily.        . celecoxib (CELEBREX) 200 MG capsule Take 200 mg by mouth daily.        . cholecalciferol (VITAMIN D-400) 400 UNITS TABS Take 400 Units by mouth daily.        Marland Kitchen co-enzyme Q-10 30 MG capsule Take 30 mg by mouth 3 (three) times daily.        . DULoxetine (CYMBALTA) 30 MG capsule Take 30 mg by mouth daily.        Marland Kitchen esomeprazole (NEXIUM) 40 MG capsule Take 40 mg by mouth daily before breakfast.        . fexofenadine (ALLEGRA) 180 MG tablet Take 180 mg by mouth daily.        Marland Kitchen gabapentin (NEURONTIN) 300 MG capsule Take 300 mg by mouth daily.        . magnesium gluconate (MAGONATE) 500 MG tablet Take 500 mg by mouth 3 (three) times daily.        . Thiamine HCl (VITAMIN B-1) 100 MG tablet Take 100 mg by mouth daily.        . solifenacin (VESICARE) 5 MG tablet Take  10 mg by mouth daily.        . traMADol (ULTRAM) 50 MG tablet Take 50 mg by mouth every 6 (six) hours as needed.          Review of Systems Review of Systems  Constitutional: Negative.   Eyes: Negative.   Respiratory: Negative.        Sleep apnea  Gastrointestinal: Negative.   Genitourinary: Negative.   Musculoskeletal: Negative.   Skin: Negative.   Neurological: Negative.   Endo/Heme/Allergies: Negative.   Psychiatric/Behavioral: Negative.     Physical Exam Physical Exam  Constitutional: She is oriented to person, place, and time. She appears well-developed and well-nourished. No distress.  HENT:  Head: Normocephalic and atraumatic.  Right Ear: External ear normal.  Left Ear: External ear normal.  Nose: Nose normal.  Mouth/Throat: Oropharynx is clear and moist.  Eyes: EOM are normal. Pupils are equal, round, and reactive to light. No scleral icterus.  Neck: Normal range of motion. Neck supple.  Cardiovascular: Normal rate, regular rhythm,  normal heart sounds and intact distal pulses.   No murmur heard. Respiratory: Effort normal and breath sounds normal. No respiratory distress. She has no wheezes.  GI: Soft. Normal appearance. She exhibits no distension and no mass. There is no tenderness. A hernia is present.  Lymphadenopathy:    She has no cervical adenopathy.  Neurological: She is oriented to person, place, and time.  Skin: Skin is warm and dry. No rash noted. No erythema.  Psychiatric: Her behavior is normal. Judgment normal.     Blood pressure 136/88, pulse 84, temperature 96.6 F (35.9 C), height 5\' 6"  (1.676 m), weight 216 lb 9.6 oz (98.249 kg).  Assessment/Plan  This is a patient with incisional hernia at the umbilicus. As it is symptomatic, repair is recommended with mesh. I discussed this with her and her husband. Since it is small I would do this open with mesh. I discussed the risk which include but are not limited to bleeding, infection, recurrence, injury  to other structures, et Karie Soda. Surgery will be scheduled. Miranda Romero A 10/16/2010, 2:44 PM

## 2010-11-10 DIAGNOSIS — M25461 Effusion, right knee: Secondary | ICD-10-CM | POA: Insufficient documentation

## 2010-11-10 DIAGNOSIS — M25569 Pain in unspecified knee: Secondary | ICD-10-CM | POA: Insufficient documentation

## 2011-01-05 DIAGNOSIS — M2390 Unspecified internal derangement of unspecified knee: Secondary | ICD-10-CM | POA: Insufficient documentation

## 2011-05-21 LAB — HM MAMMOGRAPHY: HM MAMMO: NEGATIVE

## 2011-06-22 LAB — HM PAP SMEAR

## 2011-07-07 ENCOUNTER — Encounter (INDEPENDENT_AMBULATORY_CARE_PROVIDER_SITE_OTHER): Payer: Self-pay | Admitting: General Surgery

## 2011-07-07 ENCOUNTER — Ambulatory Visit (INDEPENDENT_AMBULATORY_CARE_PROVIDER_SITE_OTHER): Payer: BC Managed Care – PPO | Admitting: General Surgery

## 2011-07-07 ENCOUNTER — Other Ambulatory Visit (INDEPENDENT_AMBULATORY_CARE_PROVIDER_SITE_OTHER): Payer: Self-pay | Admitting: General Surgery

## 2011-07-07 VITALS — BP 128/90 | HR 90 | Temp 97.6°F | Ht 66.0 in | Wt 229.4 lb

## 2011-07-07 DIAGNOSIS — K432 Incisional hernia without obstruction or gangrene: Secondary | ICD-10-CM

## 2011-07-07 NOTE — Patient Instructions (Signed)
You have an enlarging incisional hernia in the midline of your abdomen above the umbilicus. This may be partially incarcerated.  You will be scheduled for a CT scan of the abdomen to better define the extent of the hernia.  You will be scheduled for surgery. The surgical procedure will be a laparoscopic repair of your ventral hernia with mesh, possible open repair.

## 2011-07-07 NOTE — Progress Notes (Addendum)
Patient ID: Miranda Romero, female   DOB: Aug 26, 1945, 66 y.o.   MRN: [id number]  Chief Complaint  Patient presents with  . Pre-op Exam    eval hernia    HPI Miranda F Miranda Romero is a 66 y.o. female.  She comes back to [place] surgery to discuss repair of a ventral hernia.  She has undergone open cholecystectomy through a right subcostal incision many years ago. She has no problems with that incision. She underwent a laparoscopic appendectomy by Dr. Abigail Miyamoto on May 07, 2010 and recovered from that surgery uneventfully. She saw Dr. Magnus Ivan October 16, 2010 at which time she had a smaller incisional hernia above the umbilicus. He advised repair at that time.  She put that off. She has another house in [place]. She has had bilateral knee replacements at the [facility] clinic. One of these got infected and had to be removed and an another replacement done. She states that this was a streptococcal infection.  She is now at her home in [place]. She states her ventral hernia has gotten larger and is occasionally painful. She states they advised laparoscopic repair at [facility] clinic.She wants to get this repaired in [place].  She came to see me because I operated on  her mother in 2004. Her mother has since passed away.  Her comorbidities include degenerative joint disease, hypertension, open cholecystectomy, laparoscopic appendectomy, obstructive sleep apnea on CPAP followed by [facility], laparoscopic appendectomy. HPI  Past Medical History  Diagnosis Date  . Hypertension   . Umbilical hernia   . Weight decrease   . Hyperlipidemia   . Arthritis     Past Surgical History  Procedure Date  . Appendectomy   . Cholecystectomy   . Knee arthroplasty   . Ankle surgery   . Knee infection     Family History  Problem Relation Age of Onset  . Lymphoma Mother     Social History History  Substance Use Topics  . Smoking status: Never Smoker   .  Smokeless tobacco: Not on file  . Alcohol Use: No    Allergies  Allergen Reactions  . Sudafed (Pseudoephedrine Hcl)     Current Outpatient Prescriptions  Medication Sig Dispense Refill  . amlodipine-atorvastatin (CADUET) 10-10 MG per tablet Take 1 tablet by mouth daily.        . Ascorbic Acid (VITAMIN C) 100 MG tablet Take 100 mg by mouth daily.      . calcium gluconate 500 MG tablet Take 500 mg by mouth 3 (three) times daily.        . celecoxib (CELEBREX) 200 MG capsule Take 200 mg by mouth daily.        . cholecalciferol (VITAMIN D-400) 400 UNITS TABS Take 400 Units by mouth daily.        Marland Kitchen co-enzyme Q-10 30 MG capsule Take 30 mg by mouth 3 (three) times daily.        . DULoxetine (CYMBALTA) 30 MG capsule Take 30 mg by mouth daily.        Marland Kitchen esomeprazole (NEXIUM) 40 MG capsule Take 40 mg by mouth daily before breakfast.        . ferrous fumarate (HEMOCYTE - 106 MG FE) 325 (106 FE) MG TABS Take 1 tablet by mouth daily.      . fexofenadine (ALLEGRA) 180 MG tablet Take 180 mg by mouth daily.        Marland Kitchen gabapentin (NEURONTIN) 300 MG capsule Take 300 mg by mouth daily.        Marland Kitchen  magnesium gluconate (MAGONATE) 500 MG tablet Take 500 mg by mouth 3 (three) times daily.        . Thiamine HCl (VITAMIN B-1) 100 MG tablet Take 100 mg by mouth daily.        . traMADol (ULTRAM) 50 MG tablet Take 50 mg by mouth every 6 (six) hours as needed.          Review of Systems Review of Systems  Constitutional: Negative for fever, chills and unexpected weight change.  HENT: Negative for hearing loss, congestion, sore throat, trouble swallowing and voice change.   Eyes: Negative for visual disturbance.  Respiratory: Positive for shortness of breath. Negative for cough and wheezing.   Cardiovascular: Negative for chest pain, palpitations and leg swelling.  Gastrointestinal: Positive for abdominal pain and abdominal distention. Negative for nausea, vomiting, diarrhea, constipation, blood in stool and anal  bleeding.  Genitourinary: Negative for hematuria, vaginal bleeding and difficulty urinating.  Musculoskeletal: Positive for joint swelling and arthralgias.  Skin: Negative for rash and wound.  Neurological: Negative for seizures, syncope and headaches.  Hematological: Negative for adenopathy. Does not bruise/bleed easily.  Psychiatric/Behavioral: Negative for confusion.    Blood pressure 128/90, pulse 90, temperature 97.6 F (36.4 C), temperature source Temporal, height 5\' 6"  (1.676 m), weight 229 lb 6.4 oz (104.055 kg), SpO2 97.00%.  Physical Exam Physical Exam  Constitutional: She is oriented to person, place, and time. She appears well-developed and well-nourished. No distress.       BMI 37  HENT:  Head: Normocephalic and atraumatic.  Nose: Nose normal.  Mouth/Throat: No oropharyngeal exudate.  Eyes: Conjunctivae and EOM are normal. Pupils are equal, round, and reactive to light. Left eye exhibits no discharge. No scleral icterus.  Neck: Neck supple. No JVD present. No tracheal deviation present. No thyromegaly present.  Cardiovascular: Normal rate, regular rhythm, normal heart sounds and intact distal pulses.   No murmur heard. Pulmonary/Chest: Effort normal and breath sounds normal. No respiratory distress. She has no wheezes. She has no rales. She exhibits no tenderness.  Abdominal: Soft. Bowel sounds are normal. She exhibits no distension and no mass. There is no tenderness. There is no rebound and no guarding.    Musculoskeletal: She exhibits no edema and no tenderness.       Bilateral knee incisions.  Lymphadenopathy:    She has no cervical adenopathy.  Neurological: She is alert and oriented to person, place, and time. She exhibits normal muscle tone. Coordination normal.  Skin: Skin is warm. No rash noted. She is not diaphoretic. No erythema. No pallor.  Psychiatric: She has a normal mood and affect. Her behavior is normal. Judgment and thought content normal.    Data  Reviewed I have ordered a CT scan. I have reviewed our office records.  Assessment    Enlarging ventral incisional hernia at and slightly above the umbilicus. This may be partially incarcerated. It is symptomatic. Elective repair is indicated.  Obstructive sleep apnea  Degenerative joint disease status post bilateral knee replacements copy to Dr. Sedalia Muta infection  Status post open cholecystectomy  Hypertension  History laparoscopic appendectomy.    Plan    Will schedule for a laparoscopic repair of her ventral incisional hernia with mesh, possible open.  Preop CT scan ordered  I discussed the indications and details of surgery with her. Risks and complications were discussed, including but not limited to bleeding, infection, conversion to open laparotomy, recurrence of the hernia, injury to the intestine requiring intestinal surgery and change  in technique, cardiac, pulmonary, and thromboembolic problems. She understands the issues. Her questions were answered. She fully agrees with this plan.  Addendum:  CT scan shows that her periumbilical incisional hernia is somewhat larger and there are loops of nonobstructed small bowel present. Also noted is a 10 mm cyst in the tail of the pancreas that is thought to be probably benign. Six-month followup was recommended by the radiologist.  I discussed the CT findings with the patient on May 7 prior to the surgery. She understands the presence of the probably benign cyst in the tail of the pancreas. She will followup with her primary care physician and plan a repeat CT scan of the pancreas in 6 months.       Angelia Mould. Derrell Lolling, M.D., [facility] General and Minimally invasive Surgery Breast and Colorectal Surgery Office:   [phone] Pager:   [phone]  07/07/2011, 11:43 AM

## 2011-07-08 ENCOUNTER — Encounter (HOSPITAL_COMMUNITY): Payer: Self-pay | Admitting: Pharmacy Technician

## 2011-07-08 LAB — CREATININE, SERUM: Creat: 0.84 mg/dL (ref 0.50–1.10)

## 2011-07-09 ENCOUNTER — Ambulatory Visit
Admission: RE | Admit: 2011-07-09 | Discharge: 2011-07-09 | Disposition: A | Discharge status: Home / Self Care | Payer: BC Managed Care – PPO | Source: Ambulatory Visit | Attending: General Surgery | Admitting: General Surgery

## 2011-07-09 DIAGNOSIS — K432 Incisional hernia without obstruction or gangrene: Secondary | ICD-10-CM

## 2011-07-09 MED ORDER — IOHEXOL 300 MG/ML  SOLN
125.0000 mL | Freq: Once | INTRAMUSCULAR | Status: AC | PRN
  Administered 2011-07-09: 125 mL via INTRAVENOUS

## 2011-07-09 MED ORDER — IOHEXOL 300 MG/ML  SOLN: 30.0000 mL | Freq: Once | INTRAMUSCULAR | Status: DC | PRN

## 2011-07-10 ENCOUNTER — Other Ambulatory Visit: Payer: Self-pay

## 2011-07-10 ENCOUNTER — Encounter (HOSPITAL_COMMUNITY)
Admission: RE | Admit: 2011-07-10 | Discharge: 2011-07-10 | Disposition: A | Discharge status: Home / Self Care | Payer: BC Managed Care – PPO | Source: Ambulatory Visit | Attending: General Surgery | Admitting: General Surgery

## 2011-07-10 ENCOUNTER — Telehealth (INDEPENDENT_AMBULATORY_CARE_PROVIDER_SITE_OTHER): Payer: Self-pay | Admitting: Surgery

## 2011-07-10 ENCOUNTER — Encounter (HOSPITAL_COMMUNITY): Payer: Self-pay

## 2011-07-10 ENCOUNTER — Ambulatory Visit (HOSPITAL_COMMUNITY)
Admission: RE | Admit: 2011-07-10 | Discharge: 2011-07-10 | Disposition: A | Discharge status: Home / Self Care | Payer: BC Managed Care – PPO | Source: Ambulatory Visit | Attending: General Surgery | Admitting: General Surgery

## 2011-07-10 DIAGNOSIS — Z01810 Encounter for preprocedural cardiovascular examination: Secondary | ICD-10-CM | POA: Insufficient documentation

## 2011-07-10 DIAGNOSIS — I10 Essential (primary) hypertension: Secondary | ICD-10-CM | POA: Insufficient documentation

## 2011-07-10 DIAGNOSIS — K439 Ventral hernia without obstruction or gangrene: Secondary | ICD-10-CM | POA: Insufficient documentation

## 2011-07-10 DIAGNOSIS — Z01812 Encounter for preprocedural laboratory examination: Secondary | ICD-10-CM | POA: Insufficient documentation

## 2011-07-10 HISTORY — DX: Encounter for other specified aftercare: Z51.89

## 2011-07-10 HISTORY — DX: Reserved for inherently not codable concepts without codable children: IMO0001

## 2011-07-10 HISTORY — DX: Sleep apnea, unspecified: G47.30

## 2011-07-10 HISTORY — DX: Other seasonal allergic rhinitis: J30.2

## 2011-07-10 HISTORY — DX: Gastro-esophageal reflux disease without esophagitis: K21.9

## 2011-07-10 HISTORY — DX: Fibromyalgia: M79.7

## 2011-07-10 LAB — URINALYSIS, ROUTINE W REFLEX MICROSCOPIC
Glucose, UA: NEGATIVE mg/dL
Hgb urine dipstick: NEGATIVE
pH: 6 (ref 5.0–8.0)

## 2011-07-10 LAB — DIFFERENTIAL
Lymphocytes Relative: 29 % (ref 12–46)
Lymphs Abs: 2.2 10*3/uL (ref 0.7–4.0)
Monocytes Relative: 5 % (ref 3–12)
Neutrophils Relative %: 64 % (ref 43–77)

## 2011-07-10 LAB — SURGICAL PCR SCREEN
MRSA, PCR: NEGATIVE
Staphylococcus aureus: NEGATIVE

## 2011-07-10 LAB — CBC
Hemoglobin: 13 g/dL (ref 12.0–15.0)
MCH: 29.7 pg (ref 26.0–34.0)
MCHC: 33.9 g/dL (ref 30.0–36.0)
RDW: 14.2 % (ref 11.5–15.5)

## 2011-07-10 LAB — COMPREHENSIVE METABOLIC PANEL
ALT: 18 U/L (ref 0–35)
Albumin: 4.4 g/dL (ref 3.5–5.2)
BUN: 18 mg/dL (ref 6–23)
Calcium: 9.7 mg/dL (ref 8.4–10.5)
GFR calc Af Amer: 82 mL/min — ABNORMAL LOW (ref >90)
Glucose, Bld: 92 mg/dL (ref 70–99)
Potassium: 3.8 mEq/L (ref 3.5–5.1)
Sodium: 139 mEq/L (ref 135–145)
Total Protein: 8.1 g/dL (ref 6.0–8.3)

## 2011-07-10 LAB — URINE MICROSCOPIC-ADD ON

## 2011-07-10 NOTE — Patient Instructions (Signed)
[address]  07/10/2011   Your procedure is scheduled on:  07/21/11   Tuesday   Surgery  [id number]  Report to [facility] at  0700     AM.  Call this number if you have problems the morning of surgery: [phone]     Or PST   [id number]  Quinisha Mould             BRING CPAP MASK WITH YOU TO HOSPITAL  Remember:             STOP ASA, ANTIINFLAMMATORIES, OR HERBAL MEDICINES 5 DAYS BEFORE SURGERY  Do not eat food   Or drink fluids :After Midnight. Monday NIGHT  :   Clear liquids include soda, tea, black coffee, apple or grape juice, broth.  Take these medicines the morning of surgery with A SIP OF WATER:  CYMBALTA, NEXIUM, ALLEGRA, GABOPENTIN       TRAMADOL IF NEEDED   Do not wear jewelry, make-up or nail polish.  Do not wear lotions, powders, or perfumes. You may wear deodorant.  Do not shave 48 hours prior to surgery.  Do not bring valuables to the hospital.  Contacts, dentures or bridgework may not be worn into surgery.  Leave suitcase in the car. After surgery it may be brought to your room.  For patients admitted to the hospital, checkout time is 11:00 AM the day of discharge.   Patients discharged the day of surgery will not be allowed to drive home.  Name and phone number of your driver:  husband                                                                    Special Instructions: CHG Shower Use Special Wash: 1/2 bottle night before surgery and 1/2 bottle morning of surgery. REGULAR SOAP FACE AND PRIVATES              LADIES- NO SHAVING 48 HOURS BEFORE USING BETASEPT SOAP.                   Please read over the following fact sheets that you were given: MRSA Information

## 2011-07-10 NOTE — Pre-Procedure Instructions (Signed)
Requested LOV and old EKG from Dr Wylene Simmer. Pt states if stops celebrex and herbals meds 5 days pre op  Will have a "full blow fibromyalgia flare"- instructed to call surgeon and ask him about this

## 2011-07-13 ENCOUNTER — Telehealth (INDEPENDENT_AMBULATORY_CARE_PROVIDER_SITE_OTHER): Payer: Self-pay | Admitting: General Surgery

## 2011-07-13 ENCOUNTER — Encounter (HOSPITAL_COMMUNITY): Payer: Self-pay

## 2011-07-13 NOTE — Pre-Procedure Instructions (Signed)
As noted in EPIC, MD has reviewed abnormal urinalysis

## 2011-07-14 ENCOUNTER — Telehealth (INDEPENDENT_AMBULATORY_CARE_PROVIDER_SITE_OTHER): Payer: Self-pay

## 2011-07-14 NOTE — Telephone Encounter (Signed)
Pt called wanting to know what med Dr Derrell Lolling will give po for pain and or infection. I advised pt to discuss this with Dr Derrell Lolling in pre op holding area or at D/C. This way the information will be addressed in a timely manner and appropriate meds can be ordered by Dr Derrell Lolling at D/C.

## 2011-07-15 ENCOUNTER — Telehealth (INDEPENDENT_AMBULATORY_CARE_PROVIDER_SITE_OTHER): Payer: Self-pay

## 2011-07-15 NOTE — Telephone Encounter (Signed)
Pt notified of ct result and ok to proceed with surgery. Pt advised of small cyst in pancreas and need to f/u with ct in 6 mo per Dr Jacinto Halim request. Pt states she will call at end of year when she will be back in town to have this done.

## 2011-07-20 NOTE — H&P (Signed)
Miranda Romero     MRN: [id number]   Description: 66 year old female  Provider: Ernestene Mention, MD  Department: [facility]     Diagnoses     Incisional hernia   - Primary    553.21      Vitals    BP Pulse Temp(Src) Ht Wt BMI    128/90  90  97.6 F (36.4 C) (Temporal)  5\' 6"  (1.676 m)  229 lb 6.4 oz (104.055 kg)  37.03 kg/m2       History and Physical   Ernestene Mention, MD  Patient ID: Miranda Romero, female   DOB: [birth year], 66 y.o.   MRN: [id number]         HPI  Miranda Romero is a 66 y.o. female.  She comes back to [place] [facility] to discuss repair of a ventral hernia.  She has undergone open cholecystectomy through a right subcostal incision many years ago. She has no problems with that incision. She underwent a laparoscopic appendectomy by Dr. Abigail Miyamoto on May 07, 2010 and recovered from that surgery uneventfully. She saw Dr. Magnus Ivan October 16, 2010 at which time she had a smaller incisional hernia above the umbilicus. He advised repair at that time.  She put that off. She has another house in [place]. She has had bilateral knee replacements at the [facility] clinic. One of these got infected and had to be removed and an another replacement done. She states that this was a streptococcal infection.  She is now at her home in [place]. She states her ventral hernia has gotten larger and is occasionally painful. She states they advised laparoscopic repair at [facility] clinic.She wants to get this repaired in [place].  She came to see me because I operated on  her mother in 2004. Her mother has since passed away.  Her comorbidities include degenerative joint disease, hypertension, open cholecystectomy, laparoscopic appendectomy, obstructive sleep apnea on CPAP followed by [place] [facility], laparoscopic appendectomy.     Past Medical History   Diagnosis  Date   .  Hypertension     .  Umbilical hernia     .  Weight  decrease     .  Hyperlipidemia     .  Arthritis         Past Surgical History   Procedure  Date   .  Appendectomy     .  Cholecystectomy     .  Knee arthroplasty     .  Ankle surgery     .  Knee infection         Family History   Problem  Relation  Age of Onset   .  Lymphoma  Mother        Social History History   Substance Use Topics   .  Smoking status:  Never Smoker    .  Smokeless tobacco:  Not on file   .  Alcohol Use:  No       Allergies   Allergen  Reactions   .  Sudafed (Pseudoephedrine Hcl)         Current Outpatient Prescriptions   Medication  Sig  Dispense  Refill   .  amlodipine-atorvastatin (CADUET) 10-10 MG per tablet  Take 1 tablet by mouth daily.           .  Ascorbic Acid (VITAMIN C) 100 MG tablet  Take 100 mg by mouth daily.         Marland Kitchen  calcium gluconate 500 MG tablet  Take 500 mg by mouth 3 (three) times daily.           .  celecoxib (CELEBREX) 200 MG capsule  Take 200 mg by mouth daily.           .  cholecalciferol (VITAMIN D-400) 400 UNITS TABS  Take 400 Units by mouth daily.           Marland Kitchen  co-enzyme Q-10 30 MG capsule  Take 30 mg by mouth 3 (three) times daily.           .  DULoxetine (CYMBALTA) 30 MG capsule  Take 30 mg by mouth daily.           Marland Kitchen  esomeprazole (NEXIUM) 40 MG capsule  Take 40 mg by mouth daily before breakfast.           .  ferrous fumarate (HEMOCYTE - 106 MG FE) 325 (106 FE) MG TABS  Take 1 tablet by mouth daily.         .  fexofenadine (ALLEGRA) 180 MG tablet  Take 180 mg by mouth daily.           Marland Kitchen  gabapentin (NEURONTIN) 300 MG capsule  Take 300 mg by mouth daily.           .  magnesium gluconate (MAGONATE) 500 MG tablet  Take 500 mg by mouth 3 (three) times daily.           .  Thiamine HCl (VITAMIN B-1) 100 MG tablet  Take 100 mg by mouth daily.           .  traMADol (ULTRAM) 50 MG tablet  Take 50 mg by mouth every 6 (six) hours as needed.              Review of Systems  Constitutional: Negative for fever, chills and  unexpected weight change.  HENT: Negative for hearing loss, congestion, sore throat, trouble swallowing and voice change.   Eyes: Negative for visual disturbance.  Respiratory: Positive for shortness of breath. Negative for cough and wheezing.   Cardiovascular: Negative for chest pain, palpitations and leg swelling.  Gastrointestinal: Positive for abdominal pain and abdominal distention. Negative for nausea, vomiting, diarrhea, constipation, blood in stool and anal bleeding.  Genitourinary: Negative for hematuria, vaginal bleeding and difficulty urinating.  Musculoskeletal: Positive for joint swelling and arthralgias.  Skin: Negative for rash and wound.  Neurological: Negative for seizures, syncope and headaches.  Hematological: Negative for adenopathy. Does not bruise/bleed easily.  Psychiatric/Behavioral: Negative for confusion.    Blood pressure 128/90, pulse 90, temperature 97.6 F (36.4 C), temperature source Temporal, height 5\' 6"  (1.676 m), weight 229 lb 6.4 oz (104.055 kg), SpO2 97.00%.  Physical Exam  Constitutional: She is oriented to person, place, and time. She appears well-developed and well-nourished. No distress.       BMI 37  HENT:   Head: Normocephalic and atraumatic.   Nose: Nose normal.   Mouth/Throat: No oropharyngeal exudate.  Eyes: Conjunctivae and EOM are normal. Pupils are equal, round, and reactive to light. Left eye exhibits no discharge. No scleral icterus.  Neck: Neck supple. No JVD present. No tracheal deviation present. No thyromegaly present.  Cardiovascular: Normal rate, regular rhythm, normal heart sounds and intact distal pulses.    No murmur heard. Pulmonary/Chest: Effort normal and breath sounds normal. No respiratory distress. She has no wheezes. She has no rales. She exhibits no tenderness.  Abdominal: Soft. Bowel sounds  are normal. She exhibits no distension and no mass. There is no tenderness. There is no rebound and no guarding. Right subcostal  incision well healed.  Small vertical scar above umbilicus in midline.  Hernia sac 10 cm. Smaller defect suspected. Mostly reducible. Musculoskeletal: She exhibits no edema and no tenderness.       Bilateral knee incisions.  Lymphadenopathy:    She has no cervical adenopathy.  Neurological: She is alert and oriented to person, place, and time. She exhibits normal muscle tone. Coordination normal.  Skin: Skin is warm. No rash noted. She is not diaphoretic. No erythema. No pallor.  Psychiatric: She has a normal mood and affect. Her behavior is normal. Judgment and thought content normal.       Assessment  Enlarging ventral incisional hernia at and slightly above the umbilicus. This may be partially incarcerated. It is symptomatic. Elective repair is indicated.  Obstructive sleep apnea  Degenerative joint disease status post bilateral knee replacements copy to Dr. Sedalia Muta infection  Status post open cholecystectomy  Hypertension  History laparoscopic appendectomy.  Small pancreatic cyst,.   Plan  Will schedule for a laparoscopic repair of her ventral incisional hernia with mesh, possible open.  Pre op CT performed and shows small bowel in hernia and small pancreatic cyst.   Six month follow up suggested and planned.  I discussed the indications and details of surgery with her. Risks and complications were discussed, including but not limited to bleeding, infection, conversion to open laparotomy, recurrence of the hernia, injury to the intestine requiring intestinal surgery and change in technique, cardiac, pulmonary, and thromboembolic problems. She understands the issues. Her questions were answered. She fully agrees with this plan.       Angelia Mould. Derrell Lolling, M.D., [facility] General and Minimally invasive Surgery Breast and Colorectal Surgery Office:   [phone] Pager:   [phone]

## 2011-07-21 ENCOUNTER — Ambulatory Visit (HOSPITAL_COMMUNITY): Payer: BC Managed Care – PPO | Admitting: Anesthesiology

## 2011-07-21 ENCOUNTER — Encounter (HOSPITAL_COMMUNITY): Payer: Self-pay

## 2011-07-21 ENCOUNTER — Encounter (HOSPITAL_COMMUNITY): Payer: Self-pay | Admitting: Anesthesiology

## 2011-07-21 ENCOUNTER — Ambulatory Visit (HOSPITAL_COMMUNITY)
Admission: RE | Admit: 2011-07-21 | Discharge: 2011-07-25 | Disposition: A | Discharge status: Home / Self Care | Payer: BC Managed Care – PPO | Source: Ambulatory Visit | Attending: General Surgery | Admitting: General Surgery

## 2011-07-21 ENCOUNTER — Encounter (HOSPITAL_COMMUNITY)
Admission: RE | Disposition: A | Discharge status: Home / Self Care | Payer: Self-pay | Source: Ambulatory Visit | Attending: General Surgery

## 2011-07-21 DIAGNOSIS — Z79899 Other long term (current) drug therapy: Secondary | ICD-10-CM | POA: Insufficient documentation

## 2011-07-21 DIAGNOSIS — K432 Incisional hernia without obstruction or gangrene: Secondary | ICD-10-CM

## 2011-07-21 DIAGNOSIS — G4733 Obstructive sleep apnea (adult) (pediatric): Secondary | ICD-10-CM | POA: Insufficient documentation

## 2011-07-21 DIAGNOSIS — K862 Cyst of pancreas: Secondary | ICD-10-CM | POA: Insufficient documentation

## 2011-07-21 DIAGNOSIS — K219 Gastro-esophageal reflux disease without esophagitis: Secondary | ICD-10-CM | POA: Insufficient documentation

## 2011-07-21 DIAGNOSIS — I10 Essential (primary) hypertension: Secondary | ICD-10-CM | POA: Insufficient documentation

## 2011-07-21 HISTORY — PX: VENTRAL HERNIA REPAIR: SHX424

## 2011-07-21 LAB — CREATININE, SERUM
Creatinine, Ser: 0.77 mg/dL (ref 0.50–1.10)
GFR calc Af Amer: >90 mL/min (ref >90)
GFR calc non Af Amer: 85 mL/min — ABNORMAL LOW (ref >90)

## 2011-07-21 LAB — CBC
Hemoglobin: 12.6 g/dL (ref 12.0–15.0)
MCHC: 34.6 g/dL (ref 30.0–36.0)
RBC: 4.16 MIL/uL (ref 3.87–5.11)
WBC: 13 10*3/uL — ABNORMAL HIGH (ref 4.0–10.5)

## 2011-07-21 SURGERY — REPAIR, HERNIA, VENTRAL, LAPAROSCOPIC
Anesthesia: General | Site: Abdomen | Wound class: Clean

## 2011-07-21 MED ORDER — GABAPENTIN 300 MG PO CAPS
300.0000 mg | ORAL_CAPSULE | Freq: Every day | ORAL | Status: DC
  Administered 2011-07-22 – 2011-07-25 (×4): 300 mg via ORAL
  Filled 2011-07-21 (×6): qty 1

## 2011-07-21 MED ORDER — LACTATED RINGERS IV SOLN: INTRAVENOUS | Status: DC

## 2011-07-21 MED ORDER — LIDOCAINE HCL 4 % MT SOLN
OROMUCOSAL | Status: DC | PRN
  Administered 2011-07-21: 4 mL via TOPICAL

## 2011-07-21 MED ORDER — AMLODIPINE-ATORVASTATIN 10-10 MG PO TABS: 1.0000 | ORAL_TABLET | Freq: Every day | ORAL | Status: DC

## 2011-07-21 MED ORDER — MORPHINE SULFATE 10 MG/ML IJ SOLN
2.0000 mg | INTRAMUSCULAR | Status: DC | PRN
  Administered 2011-07-21 (×3): 2 mg via INTRAVENOUS
  Filled 2011-07-21 (×3): qty 1

## 2011-07-21 MED ORDER — LABETALOL HCL 5 MG/ML IV SOLN
INTRAVENOUS | Status: DC | PRN
  Administered 2011-07-21: 5 mg via INTRAVENOUS

## 2011-07-21 MED ORDER — PROMETHAZINE HCL 25 MG/ML IJ SOLN: 6.2500 mg | INTRAMUSCULAR | Status: DC | PRN

## 2011-07-21 MED ORDER — BUPIVACAINE-EPINEPHRINE PF 0.5-1:200000 % IJ SOLN
INTRAMUSCULAR | Status: DC | PRN
  Administered 2011-07-21: 15 mL

## 2011-07-21 MED ORDER — AMLODIPINE BESYLATE 10 MG PO TABS
10.0000 mg | ORAL_TABLET | Freq: Once | ORAL | Status: DC
  Filled 2011-07-21: qty 1

## 2011-07-21 MED ORDER — HEPARIN SODIUM (PORCINE) 5000 UNIT/ML IJ SOLN
5000.0000 [IU] | Freq: Once | INTRAMUSCULAR | Status: AC
  Administered 2011-07-21: 5000 [IU] via SUBCUTANEOUS

## 2011-07-21 MED ORDER — MIDAZOLAM HCL 5 MG/5ML IJ SOLN
INTRAMUSCULAR | Status: DC | PRN
  Administered 2011-07-21 (×2): 1 mg via INTRAVENOUS

## 2011-07-21 MED ORDER — ONDANSETRON HCL 4 MG/2ML IJ SOLN
INTRAMUSCULAR | Status: DC | PRN
  Administered 2011-07-21 (×2): 2 mg via INTRAVENOUS

## 2011-07-21 MED ORDER — ACETAMINOPHEN 10 MG/ML IV SOLN
INTRAVENOUS | Status: DC | PRN
  Administered 2011-07-21: 1000 mg via INTRAVENOUS

## 2011-07-21 MED ORDER — FENTANYL CITRATE 0.05 MG/ML IJ SOLN: 25.0000 ug | INTRAMUSCULAR | Status: DC | PRN

## 2011-07-21 MED ORDER — HEPARIN SODIUM (PORCINE) 5000 UNIT/ML IJ SOLN
5000.0000 [IU] | Freq: Three times a day (TID) | INTRAMUSCULAR | Status: DC
  Administered 2011-07-22 – 2011-07-25 (×10): 5000 [IU] via SUBCUTANEOUS
  Filled 2011-07-21 (×15): qty 1

## 2011-07-21 MED ORDER — TRAMADOL HCL 50 MG PO TABS
100.0000 mg | ORAL_TABLET | Freq: Every day | ORAL | Status: DC
  Administered 2011-07-22 – 2011-07-25 (×4): 100 mg via ORAL
  Filled 2011-07-21 (×7): qty 2

## 2011-07-21 MED ORDER — VANCOMYCIN HCL 1000 MG IV SOLR
1000.0000 mg | INTRAVENOUS | Status: DC | PRN
  Administered 2011-07-21: 1500 mg via INTRAVENOUS

## 2011-07-21 MED ORDER — LACTATED RINGERS IV SOLN
INTRAVENOUS | Status: DC | PRN
  Administered 2011-07-21 (×2): via INTRAVENOUS

## 2011-07-21 MED ORDER — DEXAMETHASONE SODIUM PHOSPHATE 10 MG/ML IJ SOLN
INTRAMUSCULAR | Status: DC | PRN
  Administered 2011-07-21: 10 mg via INTRAVENOUS

## 2011-07-21 MED ORDER — HYDROCODONE-ACETAMINOPHEN 5-325 MG PO TABS
1.0000 | ORAL_TABLET | ORAL | Status: DC | PRN
  Administered 2011-07-21: 1 via ORAL
  Administered 2011-07-21: 2 via ORAL
  Administered 2011-07-21: 1 via ORAL
  Administered 2011-07-22: 2 via ORAL
  Administered 2011-07-24: 1 via ORAL
  Administered 2011-07-24 – 2011-07-25 (×7): 2 via ORAL
  Filled 2011-07-21 (×7): qty 2
  Filled 2011-07-21: qty 1
  Filled 2011-07-21 (×3): qty 2

## 2011-07-21 MED ORDER — POTASSIUM CHLORIDE IN NACL 20-0.9 MEQ/L-% IV SOLN
INTRAVENOUS | Status: DC
  Administered 2011-07-21 – 2011-07-25 (×8): via INTRAVENOUS
  Filled 2011-07-21 (×10): qty 1000

## 2011-07-21 MED ORDER — CISATRACURIUM BESYLATE 2 MG/ML IV SOLN
INTRAVENOUS | Status: DC | PRN
  Administered 2011-07-21: 4 mg via INTRAVENOUS
  Administered 2011-07-21: 6 mg via INTRAVENOUS
  Administered 2011-07-21: 2 mg via INTRAVENOUS

## 2011-07-21 MED ORDER — BUPIVACAINE-EPINEPHRINE (PF) 0.5% -1:200000 IJ SOLN
INTRAMUSCULAR | Status: AC
  Filled 2011-07-21: qty 10

## 2011-07-21 MED ORDER — CEFAZOLIN SODIUM-DEXTROSE 2-3 GM-% IV SOLR
INTRAVENOUS | Status: AC
  Filled 2011-07-21: qty 50

## 2011-07-21 MED ORDER — ONDANSETRON HCL 4 MG PO TABS
4.0000 mg | ORAL_TABLET | Freq: Four times a day (QID) | ORAL | Status: DC | PRN
  Filled 2011-07-21: qty 1

## 2011-07-21 MED ORDER — LIDOCAINE HCL (CARDIAC) 20 MG/ML IV SOLN
INTRAVENOUS | Status: DC | PRN
  Administered 2011-07-21: 75 mg via INTRAVENOUS

## 2011-07-21 MED ORDER — CELECOXIB 200 MG PO CAPS
200.0000 mg | ORAL_CAPSULE | Freq: Every day | ORAL | Status: DC
  Administered 2011-07-22 – 2011-07-25 (×4): 200 mg via ORAL
  Filled 2011-07-21 (×5): qty 1

## 2011-07-21 MED ORDER — VANCOMYCIN HCL 1000 MG IV SOLR
1500.0000 mg | INTRAVENOUS | Status: DC
  Filled 2011-07-21: qty 1500

## 2011-07-21 MED ORDER — HYDROCODONE-ACETAMINOPHEN 5-325 MG PO TABS
ORAL_TABLET | ORAL | Status: AC
  Administered 2011-07-21: 1 via ORAL
  Filled 2011-07-21: qty 1

## 2011-07-21 MED ORDER — AMLODIPINE BESYLATE 10 MG PO TABS
10.0000 mg | ORAL_TABLET | Freq: Every day | ORAL | Status: DC
  Administered 2011-07-22 – 2011-07-25 (×4): 10 mg via ORAL
  Filled 2011-07-21 (×5): qty 1

## 2011-07-21 MED ORDER — SUCCINYLCHOLINE CHLORIDE 20 MG/ML IJ SOLN
INTRAMUSCULAR | Status: DC | PRN
  Administered 2011-07-21: 100 mg via INTRAVENOUS

## 2011-07-21 MED ORDER — DULOXETINE HCL 30 MG PO CPEP
30.0000 mg | ORAL_CAPSULE | Freq: Every day | ORAL | Status: DC
  Administered 2011-07-22 – 2011-07-25 (×4): 30 mg via ORAL
  Filled 2011-07-21 (×5): qty 1

## 2011-07-21 MED ORDER — BUPIVACAINE HCL (PF) 0.5 % IJ SOLN
INTRAMUSCULAR | Status: AC
  Filled 2011-07-21: qty 30

## 2011-07-21 MED ORDER — ACETAMINOPHEN 10 MG/ML IV SOLN
INTRAVENOUS | Status: AC
  Filled 2011-07-21: qty 100

## 2011-07-21 MED ORDER — MEPERIDINE HCL 50 MG/ML IJ SOLN: 6.2500 mg | INTRAMUSCULAR | Status: DC | PRN

## 2011-07-21 MED ORDER — ATORVASTATIN CALCIUM 10 MG PO TABS
10.0000 mg | ORAL_TABLET | Freq: Every day | ORAL | Status: DC
  Administered 2011-07-22 – 2011-07-24 (×3): 10 mg via ORAL
  Filled 2011-07-21 (×5): qty 1

## 2011-07-21 MED ORDER — HEPARIN SODIUM (PORCINE) 5000 UNIT/ML IJ SOLN
INTRAMUSCULAR | Status: AC
  Administered 2011-07-21: 5000 [IU] via SUBCUTANEOUS
  Filled 2011-07-21: qty 1

## 2011-07-21 MED ORDER — MORPHINE SULFATE 2 MG/ML IJ SOLN
2.0000 mg | INTRAMUSCULAR | Status: DC | PRN
  Administered 2011-07-21 – 2011-07-22 (×2): 2 mg via INTRAVENOUS
  Filled 2011-07-21 (×2): qty 1

## 2011-07-21 MED ORDER — FENTANYL CITRATE 0.05 MG/ML IJ SOLN
INTRAMUSCULAR | Status: DC | PRN
  Administered 2011-07-21: 150 ug via INTRAVENOUS
  Administered 2011-07-21 (×2): 100 ug via INTRAVENOUS
  Administered 2011-07-21: 50 ug via INTRAVENOUS

## 2011-07-21 MED ORDER — ONDANSETRON HCL 4 MG/2ML IJ SOLN: 4.0000 mg | Freq: Four times a day (QID) | INTRAMUSCULAR | Status: DC | PRN

## 2011-07-21 MED ORDER — 0.9 % SODIUM CHLORIDE (POUR BTL) OPTIME
TOPICAL | Status: DC | PRN
  Administered 2011-07-21: 1000 mL

## 2011-07-21 MED ORDER — NEOSTIGMINE METHYLSULFATE 1 MG/ML IJ SOLN
INTRAMUSCULAR | Status: DC | PRN
  Administered 2011-07-21: 4 mg via INTRAVENOUS

## 2011-07-21 MED ORDER — CEFAZOLIN SODIUM 1-5 GM-% IV SOLN
INTRAVENOUS | Status: DC | PRN
  Administered 2011-07-21: 2 g via INTRAVENOUS

## 2011-07-21 MED ORDER — ACETAMINOPHEN 325 MG PO TABS: 650.0000 mg | ORAL_TABLET | ORAL | Status: DC | PRN

## 2011-07-21 MED ORDER — GLYCOPYRROLATE 0.2 MG/ML IJ SOLN
INTRAMUSCULAR | Status: DC | PRN
  Administered 2011-07-21: .4 mg via INTRAVENOUS
  Administered 2011-07-21: 0.2 mg via INTRAVENOUS

## 2011-07-21 MED ORDER — PROPOFOL 10 MG/ML IV EMUL
INTRAVENOUS | Status: DC | PRN
  Administered 2011-07-21: 200 mg via INTRAVENOUS

## 2011-07-21 MED ORDER — VANCOMYCIN HCL IN DEXTROSE 1-5 GM/200ML-% IV SOLN
1000.0000 mg | Freq: Once | INTRAVENOUS | Status: AC
  Administered 2011-07-21: 1000 mg via INTRAVENOUS
  Filled 2011-07-21: qty 200

## 2011-07-21 MED ORDER — PANTOPRAZOLE SODIUM 40 MG PO TBEC
40.0000 mg | DELAYED_RELEASE_TABLET | Freq: Every day | ORAL | Status: DC
  Administered 2011-07-22 – 2011-07-25 (×4): 40 mg via ORAL
  Filled 2011-07-21 (×5): qty 1

## 2011-07-21 SURGICAL SUPPLY — 69 items
ADH SKN CLS APL DERMABOND .7 (GAUZE/BANDAGES/DRESSINGS) ×2
APL SKNCLS STERI-STRIP NONHPOA (GAUZE/BANDAGES/DRESSINGS) ×2
APPLIER CLIP 5 13 M/L LIGAMAX5 (MISCELLANEOUS)
APR CLP MED LRG 5 ANG JAW (MISCELLANEOUS)
BAG SPEC RTRVL LRG 6X4 10 (ENDOMECHANICALS)
BENZOIN TINCTURE PRP APPL 2/3 (GAUZE/BANDAGES/DRESSINGS) ×2 IMPLANT
BINDER ABD UNIV 12 45-62 (WOUND CARE) IMPLANT
BINDER ABDOMINAL 46IN 62IN (WOUND CARE)
BLADE HEX COATED 2.75 (ELECTRODE) ×3 IMPLANT
CANISTER SUCTION 2500CC (MISCELLANEOUS) ×3 IMPLANT
CANNULA ENDOPATH XCEL 11M (ENDOMECHANICALS) IMPLANT
CLIP APPLIE 5 13 M/L LIGAMAX5 (MISCELLANEOUS) IMPLANT
CLOTH BEACON ORANGE TIMEOUT ST (SAFETY) ×3 IMPLANT
DECANTER SPIKE VIAL GLASS SM (MISCELLANEOUS) ×3 IMPLANT
DERMABOND ADVANCED (GAUZE/BANDAGES/DRESSINGS) ×1
DERMABOND ADVANCED .7 DNX12 (GAUZE/BANDAGES/DRESSINGS) ×1 IMPLANT
DEVICE SECURE STRAP 25 ABSORB (INSTRUMENTS) ×4 IMPLANT
DEVICE TROCAR PUNCTURE CLOSURE (ENDOMECHANICALS) ×3 IMPLANT
DISSECTOR BLUNT TIP ENDO 5MM (MISCELLANEOUS) IMPLANT
DRAIN CHANNEL RND F F (WOUND CARE) IMPLANT
DRAPE LAPAROSCOPIC ABDOMINAL (DRAPES) ×3 IMPLANT
DRAPE POUCH INSTRU U-SHP 10X18 (DRAPES) ×1 IMPLANT
ELECT REM PT RETURN 9FT ADLT (ELECTROSURGICAL) ×3
ELECTRODE REM PT RTRN 9FT ADLT (ELECTROSURGICAL) ×2 IMPLANT
EVACUATOR SILICONE 100CC (DRAIN) IMPLANT
GLOVE BIOGEL PI IND STRL 7.0 (GLOVE) ×2 IMPLANT
GLOVE BIOGEL PI INDICATOR 7.0 (GLOVE) ×1
GLOVE EUDERMIC 7 POWDERFREE (GLOVE) ×3 IMPLANT
GOWN STRL NON-REIN LRG LVL3 (GOWN DISPOSABLE) ×3 IMPLANT
GOWN STRL REIN XL XLG (GOWN DISPOSABLE) ×6 IMPLANT
HAND ACTIVATED (MISCELLANEOUS) IMPLANT
KIT BASIN OR (CUSTOM PROCEDURE TRAY) ×3 IMPLANT
MESH VENTRALIGHT ST 6X8 (Mesh Specialty) ×3 IMPLANT
MESH VENTRLGHT ELLIPSE 8X6XMFL (Mesh Specialty) ×1 IMPLANT
NDL SPNL 22GX3.5 QUINCKE BK (NEEDLE) ×1 IMPLANT
NEEDLE SPNL 22GX3.5 QUINCKE BK (NEEDLE) ×3 IMPLANT
NS IRRIG 1000ML POUR BTL (IV SOLUTION) ×3 IMPLANT
PACK GENERAL/GYN (CUSTOM PROCEDURE TRAY) ×3 IMPLANT
PEN SKIN MARKING BROAD (MISCELLANEOUS) ×3 IMPLANT
PENCIL BUTTON HOLSTER BLD 10FT (ELECTRODE) IMPLANT
POUCH SPECIMEN RETRIEVAL 10MM (ENDOMECHANICALS) IMPLANT
SCISSORS LAP 5X35 DISP (ENDOMECHANICALS) ×3 IMPLANT
SET IRRIG TUBING LAPAROSCOPIC (IRRIGATION / IRRIGATOR) ×3 IMPLANT
SOLUTION ANTI FOG 6CC (MISCELLANEOUS) ×3 IMPLANT
SPONGE GAUZE 4X4 12PLY (GAUZE/BANDAGES/DRESSINGS) ×1 IMPLANT
STAPLER VISISTAT 35W (STAPLE) ×1 IMPLANT
STRIP CLOSURE SKIN 1/2X4 (GAUZE/BANDAGES/DRESSINGS) ×2 IMPLANT
SUT ETHILON 3 0 PS 1 (SUTURE) IMPLANT
SUT MNCRL AB 4-0 PS2 18 (SUTURE) ×3 IMPLANT
SUT NOVA 0 T19/GS 22DT (SUTURE) ×6 IMPLANT
SUT NOVA NAB DX-16 0-1 5-0 T12 (SUTURE) ×4 IMPLANT
SUT PDS AB 1 CTX 36 (SUTURE) IMPLANT
SUT PROLENE 0 CT 1 CR/8 (SUTURE) IMPLANT
SUT SILK 2 0 (SUTURE)
SUT SILK 2 0 30  PSL (SUTURE)
SUT SILK 2 0 30 PSL (SUTURE) IMPLANT
SUT SILK 2-0 18XBRD TIE 12 (SUTURE) IMPLANT
SUT VIC AB 2-0 CT1 27 (SUTURE)
SUT VIC AB 2-0 CT1 27XBRD (SUTURE) IMPLANT
SUT VIC AB 2-0 CTX 36 (SUTURE) IMPLANT
TACKER 5MM HERNIA 3.5CML NAB (ENDOMECHANICALS) ×3 IMPLANT
TAPE CLOTH SURG 4X10 WHT LF (GAUZE/BANDAGES/DRESSINGS) ×2 IMPLANT
TOWEL OR 17X26 10 PK STRL BLUE (TOWEL DISPOSABLE) ×3 IMPLANT
TRAY FOLEY CATH 14FRSI W/METER (CATHETERS) ×3 IMPLANT
TRAY LAP CHOLE (CUSTOM PROCEDURE TRAY) ×3 IMPLANT
TROCAR BLADELESS OPT 5 75 (ENDOMECHANICALS) ×7 IMPLANT
TROCAR XCEL BLUNT TIP 100MML (ENDOMECHANICALS) IMPLANT
TROCAR XCEL NON-BLD 11X100MML (ENDOMECHANICALS) ×3 IMPLANT
TUBING INSUFFLATION 10FT LAP (TUBING) ×3 IMPLANT

## 2011-07-21 NOTE — Anesthesia Postprocedure Evaluation (Signed)
  Anesthesia Post-op Note  Patient: Miranda Romero  Procedure(s) Performed: Procedure(s) (LRB): LAPAROSCOPIC VENTRAL HERNIA (N/A) INSERTION OF MESH (N/A)  Patient Location: PACU  Anesthesia Type: General  Level of Consciousness: awake and alert   Airway and Oxygen Therapy: Patient Spontanous Breathing  Post-op Pain: mild  Post-op Assessment: Post-op Vital signs reviewed, Patient's Cardiovascular Status Stable, Respiratory Function Stable, Patent Airway and No signs of Nausea or vomiting  Post-op Vital Signs: stable  Complications: No apparent anesthesia complications

## 2011-07-21 NOTE — Progress Notes (Signed)
ANTIBIOTIC CONSULT NOTE - INITIAL  Pharmacy Consult for Vancomycin Indication: post-op Umbilical Hernia repair  Allergies  Allergen Reactions  . Sudafed (Pseudoephedrine Hcl)     Patient Measurements: Total body weight: 105 kg    Vital Signs: Temp: 99.1 F (37.3 C) (05/07 1306) BP: 143/81 mmHg (05/07 1306) Pulse Rate: 94  (05/07 1306) Intake/Output from previous day:   Intake/Output from this shift: Total I/O In: 1900 [P.O.:50; I.V.:1850] Out: 775 [Urine:650; Other:100; Blood:25]  Labs: No results found for this basename: WBC:3,HGB:3,PLT:3,LABCREA:3,CREATININE:3 in the last 72 hours The CrCl is unknown because both a height and weight (above a minimum accepted value) are required for this calculation. No results found for this basename: VANCOTROUGH:2,VANCOPEAK:2,VANCORANDOM:2,GENTTROUGH:2,GENTPEAK:2,GENTRANDOM:2,TOBRATROUGH:2,TOBRAPEAK:2,TOBRARND:2,AMIKACINPEAK:2,AMIKACINTROU:2,AMIKACIN:2, in the last 72 hours   Microbiology: Recent Results (from the past 720 hour(s))  SURGICAL PCR SCREEN     Status: Normal   Collection Time   07/10/11  2:45 PM      Component Value Range Status Comment   MRSA, PCR NEGATIVE  NEGATIVE  Final    Staphylococcus aureus NEGATIVE  NEGATIVE  Final     Medical History: Past Medical History  Diagnosis Date  . Umbilical hernia   . Weight decrease   . Hyperlipidemia   . Arthritis   . Sleep apnea     SEVERE  per sleep study 3/12 EPIC  . Blood transfusion   . GERD (gastroesophageal reflux disease)   . Seasonal allergies   . Fibromyalgia     Dr Corliss Skains  . Hypertension     Dr Wylene Simmer LOV 4/13 on chart    Medications:  Anti-infectives     Start     Dose/Rate Route Frequency Ordered Stop   07/21/11 2200   vancomycin (VANCOCIN) IVPB 1000 mg/200 mL premix        1,000 mg 200 mL/hr over 60 Minutes Intravenous  Once 07/21/11 1348     07/21/11 0702   vancomycin (VANCOCIN) 1,500 mg in sodium chloride 0.9 % 500 mL IVPB  Status:  Discontinued         1,500 mg 250 mL/hr over 120 Minutes Intravenous 120 min pre-op 07/21/11 1610 07/21/11 1319         Assessment:  Umbilical hernia repair today, pt received Vancomycin 1500mg  x 1 pre-op at 1000  Renal function stable, no sign infection, no surgical complication noted  One more dose Vancomycin ordered, compliance with SCIP guidelines.  Goal of Therapy:  No level necessary  Plan:  Vancomycin 1gm x 1 post-op at 2200  Otho Bellows PharmD Pager [phone] 07/21/2011,1:49 PM

## 2011-07-21 NOTE — Op Note (Signed)
Patient Name:           [place] F Heroux   Date of Surgery:        07/21/2011  Pre op Diagnosis:      Ventral incisional hernia  Post op Diagnosis:    same  Procedure:                 Laparoscopic repair of ventral incisional hernia with 15 X 20 cm. Ventralight ST mesh.  Surgeon:                     Angelia Mould. Derrell Lolling, M.D., FACS  Assistant:                      none  Operative Indications:   [place] F Zarazua is a 66 y.o. female.  She has undergone open cholecystectomy through a right subcostal incision many years ago. She has no problems with that incision. She underwent a laparoscopic appendectomy by Dr. Abigail Miyamoto on May 07, 2010 and recovered from that surgery uneventfully. She saw Dr. Magnus Ivan October 16, 2010 at which time she had a smaller incisional hernia above the umbilicus. He advised repair at that time. She put that off. She has another house in [place]. She has had bilateral knee replacements at the [facility] clinic. One of these got infected and had to be removed and an another replacement done. She states that this was a streptococcal infection.  She is now at her home in [place]. She states her ventral hernia has gotten larger and is occasionally painful. She states they advised laparoscopic repair at [facility] clinic.She wants to get this repaired in [employer]. CT shows small bowel in a medium sized hernia. Her comorbidities include degenerative joint disease, hypertension, open cholecystectomy, laparoscopic appendectomy, obstructive sleep apnea on CPAP followed by [place] [facility], laparoscopic appendectomy.   Operative Findings:       Upon laparoscopic insufflation the small bowel fell out of the hernia. There were only some fatty adhesions to the hernia, no small bowel or colonic adhesions. The defect was 4-5 cm and appeared to be the only defect in the abdominal wall. There were very other minimal adhesions elsewhere.  Procedure in Detail:           Following the induction of general endotracheal anesthesia a Foley catheter was placed. Oral gastric tube was placed. Intravenous antibiotics were given. The abdomen was prepped from the breast down to the mons pubis with a sterile antiseptic. Surgical time out was performed. 0.5% Marcaine with epinephrine was used as local infiltration anesthetic.  A 5 mm optical port was placed in the left subcostal region. Entry was atraumatic. Insufflation was carried out with visualization findings as described above. Ultimately I placed a 5 mm trocar in the left lower quadrant, an 11 mm trocar in the left lateral abdomen the of the case Two 5 mm trocars in the right lateral abdomen for visualization. Some fatty adhesions were taken down out of the hernia sac using electrocautery. These were removed from  the abdominal cavity and the tissue discarded. I measured the hernia defect both by palpation and by passing a spinal needle through the abdominal wall this was marked on the abdominal wall the template. I then used a ruler  and measured a 5 cm overlap. This was about 15 cm in circular area and so  I used a 15 by 20 centimeter piece of ventralight-ST mesh and brought that to  the  operative field. I desufflated the abdomen and created a template on the abdominal wall for the placement of 8 suture fixation sites. I then placed  the sutures of #1 Novofil on the rough side of the mesh at 8 equidistant points. I took great care to set this up so that the rough side of the mesh was up against the abdominal wall and the Seprafilm side the mesh was toward the abdominal viscera. After placing all the sutures we reinsufflated the abdomen. We moistened the mesh with saline and inserted it into the abdominal cavity and positioned it. At each of the 8 suture fixation sites I made  a small puncture wound into the skin  And passed the mesh sutures through the abdominal wall, being careful take about a 1 cm bite of fascia. After all 8 of  these sutures were placed  I could lift them up and the mesh was deployed nicely with very little redundancy. I tied all the sutures and they all tied down nicely. I then used the secure strap mesh fixation device and fired 50 of these fixation devices into the mesh in a double crown technique. Great care was taken to avoid more than 1 cm gap.  this was inspected 3 times.  There was no bleeding. I checked for injury to the intestine there was none. The trocars were removed and the pneumoperitoneum was released. All the skin incisions were closed with subcuticular sutures of 4-0 Monocryl and Dermabond. A Velcro abdominal binder was placed. The patient tolerated the procedure and was taken to recovery room stable. There were no complications. EBL 20 cc. Counts correct.     Angelia Mould. Derrell Lolling, M.D., FACS General and Minimally Invasive Surgery Breast and Colorectal Surgery  07/21/2011 11:20 AM

## 2011-07-21 NOTE — Transfer of Care (Signed)
Immediate Anesthesia Transfer of Care Note  Patient: Miranda Romero  Procedure(s) Performed: Procedure(s) (LRB): LAPAROSCOPIC VENTRAL HERNIA (N/A) INSERTION OF MESH (N/A)  Patient Location: PACU  Anesthesia Type: General  Level of Consciousness: awake, alert  and oriented  Airway & Oxygen Therapy: Patient Spontanous Breathing and Patient connected to face mask oxygen  Post-op Assessment: Report given to PACU RN and Post -op Vital signs reviewed and stable  Post vital signs: Reviewed and stable  Complications: No apparent anesthesia complications

## 2011-07-21 NOTE — Anesthesia Preprocedure Evaluation (Addendum)
Anesthesia Evaluation  Patient identified by MRN, date of birth, ID band Patient awake    Reviewed: Allergy & Precautions, H&P , NPO status , Patient's Chart, lab work & pertinent test results  Airway Mallampati: II TM Distance: >3 FB Neck ROM: Full    Dental No notable dental hx.    Pulmonary neg pulmonary ROS, sleep apnea and Continuous Positive Airway Pressure Ventilation ,  breath sounds clear to auscultation  Pulmonary exam normal       Cardiovascular hypertension, Pt. on medications negative cardio ROS  Rhythm:Regular Rate:Normal     Neuro/Psych  Neuromuscular disease negative neurological ROS  negative psych ROS   GI/Hepatic negative GI ROS, Neg liver ROS, GERD-  ,  Endo/Other  negative endocrine ROS  Renal/GU negative Renal ROS  negative genitourinary   Musculoskeletal negative musculoskeletal ROS (+) Fibromyalgia -  Abdominal   Peds negative pediatric ROS (+)  Hematology negative hematology ROS (+)   Anesthesia Other Findings   Reproductive/Obstetrics negative OB ROS                         Anesthesia Physical Anesthesia Plan  ASA: II  Anesthesia Plan: General   Post-op Pain Management:    Induction: Intravenous  Airway Management Planned: Oral ETT  Additional Equipment:   Intra-op Plan:   Post-operative Plan: Extubation in OR  Informed Consent: I have reviewed the patients History and Physical, chart, labs and discussed the procedure including the risks, benefits and alternatives for the proposed anesthesia with the patient or authorized representative who has indicated his/her understanding and acceptance.   Dental advisory given  Plan Discussed with: CRNA  Anesthesia Plan Comments:         Anesthesia Quick Evaluation

## 2011-07-21 NOTE — Anesthesia Procedure Notes (Signed)
Procedure Name: Intubation Date/Time: 07/21/2011 9:39 AM Performed by: Edison Pace Pre-anesthesia Checklist: Patient identified, Timeout performed, Emergency Drugs available, Suction available and Patient being monitored Patient Re-evaluated:Patient Re-evaluated prior to inductionOxygen Delivery Method: Circle system utilized Preoxygenation: Pre-oxygenation with 100% oxygen Intubation Type: IV induction Ventilation: Mask ventilation without difficulty Laryngoscope Size: Mac and 4 Grade View: Grade II Tube type: Oral Tube size: 7.0 mm Number of attempts: 1 Placement Confirmation: ETT inserted through vocal cords under direct vision,  positive ETCO2 and breath sounds checked- equal and bilateral Secured at: 21 cm Dental Injury: Injury to lip  Difficulty Due To: Difficulty was anticipated, Difficult Airway- due to large tongue, Difficult Airway- due to anterior larynx and Difficult Airway- due to limited oral opening Future Recommendations: Recommend- induction with short-acting agent, and alternative techniques readily available

## 2011-07-21 NOTE — Interval H&P Note (Signed)
History and Physical Interval Note:  07/21/2011 9:08 AM  [place] F Kagan  has presented today for surgery, with the diagnosis of painful hernia  The goals of treatment and the various methods of treatment have been discussed with the patient and family. After consideration of risks, benefits and other options for treatment, the patient has consented to  Procedure(s) (LRB): LAPAROSCOPIC VENTRAL HERNIA (N/A) INSERTION OF MESH (N/A) HERNIA REPAIR VENTRAL ADULT (N/A) as a surgical intervention .  The patients' history has been reviewed and the  patient examined today , no change in status, stable for surgery.  I have reviewed the patients' chart and labs.  Questions were answered to the patient's satisfaction.     Ernestene Mention

## 2011-07-22 MED ORDER — DIPHENHYDRAMINE HCL 12.5 MG/5ML PO ELIX: 12.5000 mg | ORAL_SOLUTION | Freq: Four times a day (QID) | ORAL | Status: DC | PRN

## 2011-07-22 MED ORDER — ONDANSETRON HCL 4 MG/2ML IJ SOLN: 4.0000 mg | Freq: Four times a day (QID) | INTRAMUSCULAR | Status: DC | PRN

## 2011-07-22 MED ORDER — DIPHENHYDRAMINE HCL 50 MG/ML IJ SOLN: 12.5000 mg | Freq: Four times a day (QID) | INTRAMUSCULAR | Status: DC | PRN

## 2011-07-22 MED ORDER — SODIUM CHLORIDE 0.9 % IJ SOLN: 9.0000 mL | INTRAMUSCULAR | Status: DC | PRN

## 2011-07-22 MED ORDER — NALOXONE HCL 0.4 MG/ML IJ SOLN: 0.4000 mg | INTRAMUSCULAR | Status: DC | PRN

## 2011-07-22 MED ORDER — HYDROMORPHONE 0.3 MG/ML IV SOLN
INTRAVENOUS | Status: DC
  Administered 2011-07-22: 22:00:00 via INTRAVENOUS
  Administered 2011-07-22: 4.5 mg via INTRAVENOUS
  Administered 2011-07-22: 08:00:00 via INTRAVENOUS
  Administered 2011-07-22: 1.8 mg via INTRAVENOUS
  Administered 2011-07-22: 2.4 mg via INTRAVENOUS
  Administered 2011-07-23: 0.9 mg via INTRAVENOUS
  Administered 2011-07-23: 3.45 mg via INTRAVENOUS
  Administered 2011-07-23: 1.2 mg via INTRAVENOUS
  Administered 2011-07-23: 3.3 mg via INTRAVENOUS
  Administered 2011-07-24: 2.4 mg via INTRAVENOUS
  Filled 2011-07-22 (×3): qty 25

## 2011-07-22 MED ORDER — KETOROLAC TROMETHAMINE 15 MG/ML IJ SOLN
15.0000 mg | Freq: Four times a day (QID) | INTRAMUSCULAR | Status: DC
  Administered 2011-07-22 – 2011-07-24 (×8): 15 mg via INTRAVENOUS
  Filled 2011-07-22 (×14): qty 1

## 2011-07-22 MED FILL — Cisatracurium Besylate (PF) IV Soln 10 MG/5ML (2 MG/ML): INTRAVENOUS | Qty: 5 | Status: AC

## 2011-07-22 NOTE — Progress Notes (Signed)
1 Day Post-Op  Subjective: Stable and alert. The pain isnot well controlled. Tolerating liquid diet without nausea. Voiding okay. Using bedside commode because it hurts to ambulate to bathroom. Vital signs stable. Mental status normal.  Objective: Vital signs in last 24 hours: Temp:  [96.9 F (36.1 C)-99.1 F (37.3 C)] 98.7 F (37.1 C) (05/08 0520) Pulse Rate:  [77-100] 100  (05/08 0520) Resp:  [11-20] 18  (05/08 0520) BP: (111-155)/(72-129) 125/77 mmHg (05/08 0520) SpO2:  [93 %-100 %] 93 % (05/08 0520) Weight:  [227 lb (102.967 kg)] 227 lb (102.967 kg) (05/07 1306) Last BM Date: 07/21/11  Intake/Output from previous day: 05/07 0701 - 05/08 0700 In: 2490 [P.O.:640; I.V.:1850] Out: 1075 [Urine:950; Blood:25] Intake/Output this shift:    General appearance: alert and cooperative. Mild distress from pain. Skin warm and dry. Resp: clear to auscultation bilaterally GI: abdomen is soft with appropriate incisional tenderness. Not distended. Wounds looked fine. No bleeding or hematoma. Hypoactive bowel sounds.  Lab Results:  Results for orders placed during the hospital encounter of 07/21/11 (from the past 24 hour(s))  CBC     Status: Abnormal   Collection Time   07/21/11  2:06 PM      Component Value Range   WBC 13.0 (*) 4.0 - 10.5 (K/uL)   RBC 4.16  3.87 - 5.11 (MIL/uL)   Hemoglobin 12.6  12.0 - 15.0 (g/dL)   HCT 81.1  91.4 - 78.2 (%)   MCV 87.5  78.0 - 100.0 (fL)   MCH 30.3  26.0 - 34.0 (pg)   MCHC 34.6  30.0 - 36.0 (g/dL)   RDW 95.6  21.3 - 08.6 (%)   Platelets 325  150 - 400 (K/uL)  CREATININE, SERUM     Status: Abnormal   Collection Time   07/21/11  2:06 PM      Component Value Range   Creatinine, Ser 0.77  0.50 - 1.10 (mg/dL)   GFR calc non Af Amer 85 (*) >90 (mL/min)   GFR calc Af Amer >90  >90 (mL/min)     Studies/Results: @RISRSLT24 @     . amLODipine  10 mg Oral Daily  . atorvastatin  10 mg Oral q1800  . celecoxib  200 mg Oral Q breakfast  . DULoxetine  30  mg Oral Q breakfast  . gabapentin  300 mg Oral Q breakfast  . heparin  5,000 Units Subcutaneous Once  . heparin  5,000 Units Subcutaneous Q8H  . pantoprazole  40 mg Oral Q1200  . traMADol  100 mg Oral Daily  . vancomycin  1,000 mg Intravenous Once  . DISCONTD: amlodipine-atorvastatin  1 tablet Oral Q breakfast  . DISCONTD: vancomycin  1,500 mg Intravenous 120 min pre-op     Assessment/Plan: s/p Procedure(s): LAPAROSCOPIC VENTRAL HERNIA INSERTION OF MESH  POD #1. Stable. Needs better pain control. No evidence of complications. Will discontinue morphine and substitute Dilaudid PCA plus IV Toradol every 6 hours. Full liquid diet Ambulate as tolerated.  10 cm benign-appearing cyst in tail of pancreas. Discussed with patient. Recommend repeat CT of pancreas in 6 months, possibly with her primary care physician. She says she will do this.  Obstructive sleep apnea. She has her CPAP system here but has not used it yet. We'll ask respiratory therapy to assist.  Hypertension. Controlled.    LOS: 1 day    Jerone Cudmore M. Derrell Lolling, M.D., [facility] General and Minimally invasive Surgery Breast and Colorectal Surgery Office:   [phone] Pager:  [phone]  07/22/2011  . .prob

## 2011-07-22 NOTE — Progress Notes (Signed)
Pt placed on auto-CPAP with nasal mask and 3 L/M O2 bled-in.  Pt is tolerating well.  Pt encouraged to notify RN/ RT of any concerns.

## 2011-07-23 ENCOUNTER — Encounter (HOSPITAL_COMMUNITY): Payer: Self-pay | Admitting: General Surgery

## 2011-07-23 MED ORDER — BIOTENE DRY MOUTH MT LIQD
15.0000 mL | Freq: Two times a day (BID) | OROMUCOSAL | Status: DC
  Administered 2011-07-23 – 2011-07-25 (×5): 15 mL via OROMUCOSAL

## 2011-07-23 MED ORDER — POLYETHYLENE GLYCOL 3350 17 G PO PACK
17.0000 g | PACK | Freq: Two times a day (BID) | ORAL | Status: DC
  Administered 2011-07-23 – 2011-07-25 (×5): 17 g via ORAL
  Filled 2011-07-23 (×6): qty 1

## 2011-07-23 MED ORDER — CHLORHEXIDINE GLUCONATE 0.12 % MT SOLN
15.0000 mL | Freq: Two times a day (BID) | OROMUCOSAL | Status: DC
  Administered 2011-07-23 – 2011-07-25 (×5): 15 mL via OROMUCOSAL
  Filled 2011-07-23 (×7): qty 15

## 2011-07-23 NOTE — Progress Notes (Addendum)
2 Days Post-Op  Subjective: Feels much better. Pain control and dramatically improved with PCA Dilaudid and Toradol. Tolerating full liquid diet. No flatus or stool. Able to ambulate. Spirits better.  Using CPAP with respiratory therapy assistance. She is comfortable with that.  Objective: Vital signs in last 24 hours: Temp:  [97.6 F (36.4 C)-98.7 F (37.1 C)] 97.8 F (36.6 C) (05/09 0500) Pulse Rate:  [83-93] 88  (05/09 0500) Resp:  [10-18] 17  (05/09 0500) BP: (128-144)/(71-77) 133/77 mmHg (05/09 0500) SpO2:  [92 %-97 %] 95 % (05/09 0500) FiO2 (%):  [28 %] 28 % (05/08 1154) Last BM Date: 07/21/11  Intake/Output from previous day: 05/08 0701 - 05/09 0700 In: 4090.3 [P.O.:1070; I.V.:3020.3] Out: 300 [Urine:300] Intake/Output this shift:    General appearance: alert. No distress. Skin warm and dry. Mental status normal. GI: abdomen soft. Minimal tenderness. Wounds look fine. Hypoactive bowel sounds.  Lab Results:  No results found for this or any previous visit (from the past 24 hour(s)).   Studies/Results: @RISRSLT24 @     . amLODipine  10 mg Oral Daily  . antiseptic oral rinse  15 mL Mouth Rinse q12n4p  . atorvastatin  10 mg Oral q1800  . celecoxib  200 mg Oral Q breakfast  . chlorhexidine  15 mL Mouth Rinse BID  . DULoxetine  30 mg Oral Q breakfast  . gabapentin  300 mg Oral Q breakfast  . heparin  5,000 Units Subcutaneous Q8H  . HYDROmorphone PCA 0.3 mg/mL   Intravenous Q4H  . ketorolac  15 mg Intravenous Q6H  . pantoprazole  40 mg Oral Q1200  . traMADol  100 mg Oral Daily     Assessment/Plan: s/p Procedure(s): LAPAROSCOPIC VENTRAL HERNIA INSERTION OF MESH  POD #2. Improved Pain control very good now. Still has a component of ileus Try MiraLAX Advance diet Possible discharge tomorrow.  10 cm benign-appearing cyst in tail of pancreas. Discussed with patient. Plan is for her to have repeat CT scan of pancreas in 6 months and follow up with primary  care physician. She states she will do this.  Obstructive sleep apnea. Stable  Hypertension. Controlled.     LOS: 2 days    Miranda Romero, M.D., [facility] General and Minimally invasive Surgery Breast and Colorectal Surgery Office:   [phone] Pager:   [phone]  07/23/2011  . .prob

## 2011-07-24 NOTE — Progress Notes (Signed)
Pt placed on CPAP at current settings by RN and is tolerating well.  Pt encouraged to notify RN/ RT of any concerns.

## 2011-07-24 NOTE — Progress Notes (Signed)
Pt placed on Auto-CPAP with 3 LPM O2 bleed in via nasal mask, Pt tolerating well at this time, RT to monitor and assess as needed

## 2011-07-24 NOTE — Plan of Care (Signed)
Problem: Food- and Nutrition-Related Knowledge Deficit (NB-1.1) Goal: Nutrition education Formal process to instruct or train a patient/client in a skill or to impart knowledge to help patients/clients voluntarily manage or modify food choices and eating behavior to maintain or improve health.  Outcome: Completed/Met Date Met:  07/24/11 Pt with questions regarding diet for GERD. Discussed recommended foods, foods to avoid, and timing of meals. Encouraged small frequent bland meals. Handout of this information provided along with RD contact information for further questions. Pt expressed understanding and was appreciative of visit.

## 2011-07-24 NOTE — Progress Notes (Signed)
3 Days Post-Op  Subjective: Ambulated in the hall 4 times. Eating some solid food without nausea. Past a tiny amount of flatus. No stool despite 2 doses of MiraLAX yesterday.Denies cramps. Intermitted GERD symptoms.  Using CPAP comfortably.  Objective: Vital signs in last 24 hours: Temp:  [98.5 F (36.9 C)-98.8 F (37.1 C)] 98.5 F (36.9 C) (05/09 2015) Pulse Rate:  [90-92] 90  (05/09 2015) Resp:  [9-18] 11  (05/09 2015) BP: (115-132)/(68-74) 132/68 mmHg (05/09 2015) SpO2:  [92 %-95 %] 95 % (05/09 2015) FiO2 (%):  [21 %-28 %] 28 % (05/09 1556) Last BM Date: 07/21/11  Intake/Output from previous day: 05/09 0701 - 05/10 0700 In: 3314.2 [P.O.:720; I.V.:2594.2] Out: -  Intake/Output this shift: Total I/O In: 240 [P.O.:240] Out: -   General appearance: appears pleasant and comfortable. Moves slowly but in no distress. Skin warm and dry. Mental status normal. Resp: clear to auscultation bilaterally GI: abdomen obese. Soft. Hypoactive bowel sounds. Not distended. Wounds looked fine. Minimal, appropriate tenderness.  Lab Results:  No results found for this or any previous visit (from the past 24 hour(s)).   Studies/Results: @RISRSLT24 @     . amLODipine  10 mg Oral Daily  . antiseptic oral rinse  15 mL Mouth Rinse q12n4p  . atorvastatin  10 mg Oral q1800  . celecoxib  200 mg Oral Q breakfast  . chlorhexidine  15 mL Mouth Rinse BID  . DULoxetine  30 mg Oral Q breakfast  . gabapentin  300 mg Oral Q breakfast  . heparin  5,000 Units Subcutaneous Q8H  . pantoprazole  40 mg Oral Q1200  . polyethylene glycol  17 g Oral BID  . traMADol  100 mg Oral Daily  . DISCONTD: HYDROmorphone PCA 0.3 mg/mL   Intravenous Q4H  . DISCONTD: ketorolac  15 mg Intravenous Q6H     Assessment/Plan: s/p Procedure(s): LAPAROSCOPIC VENTRAL HERNIA INSERTION OF MESH  POD #3.  Continues to improve without complication, but a GI function has not normalized. Will discontinue PCA and switch to oral  analgesic. Continue MiraLAX twice a day. Hopefully discharge tomorrow.  10 cm benign-appearing cyst in tail of pancreas. Discussed with patient. Plan is for her to have repeat CT scan of pancreas in 6 months and follow up with primary care physician. She states she will do this.   Obstructive sleep apnea. Stable   Hypertension. Controlled     LOS: 3 days    Luanne Krzyzanowski M. Derrell Lolling, M.D., [facility] General and Minimally invasive Surgery Breast and Colorectal Surgery Office:   [phone] Pager:   [phone]  07/24/2011  . .prob

## 2011-07-25 MED ORDER — HYDROCODONE-ACETAMINOPHEN 5-325 MG PO TABS: 1.0000 | ORAL_TABLET | Freq: Four times a day (QID) | ORAL | Status: AC | PRN

## 2011-07-25 NOTE — Discharge Instructions (Signed)
[facility] - DISCHARGE INSTRUCTIONS TO PATIENT  Activity:  Driving - May drive in 3 to 4 days, if doing well.   Lifting - No lifting greater than 15 pounds for 3 weeks.  Wound Care:   May shower.  Diet:  Eat light until you have a bowel movement.  Drink plenty of liquids.  May use MOM for BM.  Follow up appointment:  Call Dr. Jacinto Halim office [facility]) at [phone] for an appointment in 2 to 3 week.s  Medications and dosages:  Resume your home medications.  You have a prescription for:  Vicodin.  Call Dr. Ezzard Standing or his office  [phone]) if you have:  Temperature greater than 100.4,  Persistent nausea and vomiting,  Severe uncontrolled pain,  Redness, tenderness, or signs of infection (pain, swelling, redness, odor or green/yellow discharge around the site),  Difficulty breathing, headache or visual disturbances,  Any other questions or concerns you may have after discharge.  In an emergency, call 911 or go to an Emergency Department at a nearby hospital.

## 2011-07-25 NOTE — Progress Notes (Signed)
General Surgery Note  LOS: 4 days  POD# 4  Assessment/Plan: 1. LAPAROSCOPIC VENTRAL HERNIA, INSERTION OF MESH - 07/21/2011 - Miranda Romero well.  Has passed flatus, but no BM.  Husband in room.  Ready for d/c.  Instructions reviewed.   2.  On CPAP 3.  GERD 4.  Cyst in tail of pancreas. 10 mm. 5.  Hypertension.  Subjective:  Doing well and ready to get out of hospital. Objective:   Filed Vitals:   07/25/11 1004  BP: 131/84  Pulse: 82  Temp:   Resp:      Intake/Output from previous day:  05/10 0701 - 05/11 0700 In: 1835.8 [P.O.:1080; I.V.:755.8] Out: -   Intake/Output this shift:  Total I/O In: 240 [P.O.:240] Out: -    Physical Exam:   General: Obese older WF who is alert and oriented.    HEENT: Normal. Pupils equal. .   Lungs: Clear   Abdomen: Soft   Wound: Clean.  No drainage or redness.   Neurologic:  Grossly intact to motor and sensory function.   Psychiatric: Has normal mood and affect. Behavior is normal   Lab Results:   No results found for this basename: WBC:2,HGB:2,HCT:2,PLT:2 in the last 72 hours  BMET  No results found for this basename: NA:2,K:2,CL:2,CO2:2,GLUCOSE:2,BUN:2,CREATININE:2,CALCIUM:2 in the last 72 hours  PT/INR  No results found for this basename: LABPROT:2,INR:2 in the last 72 hours  ABG  No results found for this basename: PHART:2,PCO2:2,PO2:2,HCO3:2 in the last 72 hours   Studies/Results:  No results found.   Anti-infectives:   Anti-infectives     Start     Dose/Rate Route Frequency Ordered Stop   07/21/11 2200   vancomycin (VANCOCIN) IVPB 1000 mg/200 mL premix        1,000 mg 200 mL/hr over 60 Minutes Intravenous  Once 07/21/11 1348 07/21/11 2329   07/21/11 0702   vancomycin (VANCOCIN) 1,500 mg in sodium chloride 0.9 % 500 mL IVPB  Status:  Discontinued        1,500 mg 250 mL/hr over 120 Minutes Intravenous 120 min pre-op 07/21/11 0702 07/21/11 1319          Ovidio Kin, MD, FACS Pager: [phone],   [facility] Office: [phone] 07/25/2011

## 2011-07-25 NOTE — Progress Notes (Signed)
Pt. Left via wheelchair with family, no resp. Distress noted. Discharged to home.

## 2011-07-25 NOTE — Progress Notes (Signed)
Discharge teaching completed. Handouts given and reviewed on Surgical Hernia Repair and Norco. Reviewed MD's discharge instruction sheet. MD gave a prescription for Norco. Answered questions. Continue to assess and monitor.

## 2011-07-29 ENCOUNTER — Telehealth (INDEPENDENT_AMBULATORY_CARE_PROVIDER_SITE_OTHER): Payer: Self-pay

## 2011-07-29 NOTE — Telephone Encounter (Signed)
Per Dr Jacinto Halim order pt notified of lab results drawn 5-7 by Hospital.

## 2011-08-06 ENCOUNTER — Encounter (INDEPENDENT_AMBULATORY_CARE_PROVIDER_SITE_OTHER): Payer: Self-pay

## 2011-08-14 ENCOUNTER — Encounter (INDEPENDENT_AMBULATORY_CARE_PROVIDER_SITE_OTHER): Payer: Self-pay | Admitting: General Surgery

## 2011-08-14 ENCOUNTER — Ambulatory Visit (INDEPENDENT_AMBULATORY_CARE_PROVIDER_SITE_OTHER): Payer: BC Managed Care – PPO | Admitting: General Surgery

## 2011-08-14 VITALS — BP 150/90 | HR 88 | Temp 96.6°F | Resp 18 | Ht 66.0 in | Wt 220.4 lb

## 2011-08-14 DIAGNOSIS — K432 Incisional hernia without obstruction or gangrene: Secondary | ICD-10-CM

## 2011-08-14 NOTE — Progress Notes (Signed)
Subjective:     Patient ID: Miranda Romero, female   DOB: 1946/01/11, 66 y.o.   MRN: [id number]  HPI This patient underwent a laparoscopic repair of ventral incisional hernia on May 7. She is doing very well. Pain is much better. Minimal tenderness at a couple of the suture fixation sites. She has had a cataract operation. She is driving her car. No complaints about the wounds. Just a little sore.  Review of Systems     Objective:   Physical Exam Patient looks well. She is in good spirits. Friendly.  Abdomen is examined supine and standing. All of the wounds are healed well. The hernia repair is intact. Minimal seroma.    Assessment:     Ventral incisional hernia, recovering uneventfully following laparoscopic repair with mesh    Plan:     No sports or heavy lifting until 5 weeks postop.  Return to see me p.r.n.   Angelia Mould. Derrell Lolling, M.D., [facility] General and Minimally invasive Surgery Breast and Colorectal Surgery Office:   [phone] Pager:   [phone]

## 2011-08-14 NOTE — Patient Instructions (Signed)
You seem to be recovering from your hernia repair without any obvious complications.  I encourage you to take a walk daily. You may drive her car.  No sports or lifting more than 15 pounds for 5 weeks from the date of surgery. After that time you may resume normal activities.  Return to see Dr. Derrell Lolling if further problems arise.

## 2011-08-27 ENCOUNTER — Telehealth: Payer: Self-pay | Admitting: Pulmonary Disease

## 2011-08-27 NOTE — Telephone Encounter (Signed)
Patient returning call.

## 2011-08-27 NOTE — Telephone Encounter (Signed)
lmomtcb x1 for pt 

## 2011-08-27 NOTE — Telephone Encounter (Signed)
Pt states she is on her way back to [place] and will not be back in [place] until Jan 2014. She is aware that she cannot get any supplies for her cpap without an appt for follow-up and will call when she gets back. I will forward to Alida so she is aware.

## 2011-08-31 NOTE — Telephone Encounter (Signed)
Will forward this info to Dr Vassie Loll

## 2012-03-16 HISTORY — PX: APPENDECTOMY: SHX54

## 2012-04-05 ENCOUNTER — Other Ambulatory Visit (INDEPENDENT_AMBULATORY_CARE_PROVIDER_SITE_OTHER): Payer: Self-pay | Admitting: General Surgery

## 2012-04-05 ENCOUNTER — Telehealth (INDEPENDENT_AMBULATORY_CARE_PROVIDER_SITE_OTHER): Payer: Self-pay | Admitting: General Surgery

## 2012-04-05 DIAGNOSIS — K862 Cyst of pancreas: Secondary | ICD-10-CM

## 2012-04-05 NOTE — Telephone Encounter (Signed)
Called to advise patient of CT scheduled with [facility] and follow up appointment to discuss results with Dr. Derrell Lolling.  Patient to go to [address] location for CT. Patient to arrive on 04/11/12 at 10:00. NPO after 7:00a.m. Liquids and medications are allowed. No contrast to be given as the test will be based on pancreatic protocol. Patient will drink water at their facility prior to test.  Patient to see Dr. Derrell Lolling to discuss the results on 04/15/12 at 11:00 am.  Patient will need to confirm that these dates and times will work for her schedule. Patient needs to bring her insurance information to the appointment as well.

## 2012-04-05 NOTE — Telephone Encounter (Signed)
Patient returned call and was advised of appointment for CT and to bring her insurance information with her as well. Patient also advised of appointment to see Dr. Derrell Lolling on 04/15/12 as well. Patient given # to [facility] in case her plans change and she has to cancel the appointment for any reason. Advised patient to please call me if that happens so the appointment with Dr. Derrell Lolling to discuss the results can be changed. Patient agreed. Patient stated they are here for 9 weeks because they have another home in [place], and they are getting all of their medical needs addressed while here. Said after the 9 weeks they are back in [place] and not back in [place] until January of next year.

## 2012-04-05 NOTE — Telephone Encounter (Signed)
Called patient and advised I was calling back based on the message she left regarding a CAT scan needing to be ordered. I advised her that based on the last office visit with Dr. Derrell Lolling on 08/14/11 it was not mentioned that one was needed. Patient stated she was told by Dr. Derrell Lolling that she had a spot on her liver and he wanted to keep an eye on it. Patient stated that is she does not have to take the test that would be fine with her due to the contrast. Patient requested a call back either way to let her know. York Spaniel she will be in town the next 9 weeks, can take the test during that time.  The CT from 07/09/11 does not indicate a spot on her liver. It was noted as unremarkable. A staff message has been sent to Dr. Derrell Lolling to advise of patient request and to determine if based on his note and the tests from April 2013 if the order should be submitted.

## 2012-04-11 ENCOUNTER — Ambulatory Visit
Admission: RE | Admit: 2012-04-11 | Discharge: 2012-04-11 | Disposition: A | Discharge status: Home / Self Care | Payer: BC Managed Care – PPO | Source: Ambulatory Visit | Attending: General Surgery | Admitting: General Surgery

## 2012-04-11 ENCOUNTER — Telehealth (INDEPENDENT_AMBULATORY_CARE_PROVIDER_SITE_OTHER): Payer: Self-pay | Admitting: General Surgery

## 2012-04-11 MED ORDER — IOHEXOL 300 MG/ML  SOLN
125.0000 mL | Freq: Once | INTRAMUSCULAR | Status: AC | PRN
  Administered 2012-04-11: 125 mL via INTRAVENOUS

## 2012-04-11 NOTE — Telephone Encounter (Signed)
Called and left message for patient to call back to obtain radiology results.   Patient called back and advised per Dr. Derrell Lolling: " Tell her nothing else needs to be done. Mail her a copy of the CT report"   Patient advised of findings on test "no focal abnormalities seen in the liver or spleen. Tere is a small hiatal hernia. Duodenum is unremarkable".  Patient did not feel need to keep the appointment on Friday with Dr. Derrell Lolling. Appointment cancelled. Copy of CT mailed to the patient for her records.

## 2012-04-15 ENCOUNTER — Encounter (INDEPENDENT_AMBULATORY_CARE_PROVIDER_SITE_OTHER): Payer: BC Managed Care – PPO | Admitting: General Surgery

## 2012-10-10 ENCOUNTER — Ambulatory Visit: Payer: Self-pay | Admitting: Nurse Practitioner

## 2013-04-10 ENCOUNTER — Encounter: Payer: Self-pay | Admitting: Nurse Practitioner

## 2013-04-10 ENCOUNTER — Ambulatory Visit (INDEPENDENT_AMBULATORY_CARE_PROVIDER_SITE_OTHER): Payer: BC Managed Care – PPO | Admitting: Nurse Practitioner

## 2013-04-10 VITALS — BP 126/80 | HR 80 | Ht 65.75 in | Wt 240.0 lb

## 2013-04-10 DIAGNOSIS — Z01419 Encounter for gynecological examination (general) (routine) without abnormal findings: Secondary | ICD-10-CM

## 2013-04-10 NOTE — Progress Notes (Signed)
Patient ID: Miranda Romero, female   DOB: [birth year], 68 y.o.   MRN: [id number] 68 y.o. G2, P2. Married Caucasian Fe here for annual exam.  She is still havig pain both knees and left hip.  She is in [place] for 3 months January - March then beck to [place].  She helps her daughter out with the grandchildren.    No LMP recorded. Patient is postmenopausal.          Sexually active: yes  The current method of family planning is post menopausal status.    Exercising: no  resuming exercise Smoker:  no  Health Maintenance: Pap:  06/22/11, WNL MMG:  03/2013, [facility], pt reports normal Colonoscopy:  12/19/04, repeat 10 years BMD:  06/2011 with ortho, Dr. Link Snuffer will order TDaP:  ? Shingles: has not had Labs:  PCP, last week   reports that she has never smoked. She has never used smokeless tobacco. She reports that she drinks alcohol. She reports that she does not use illicit drugs.  Past Medical History  Diagnosis Date  . Umbilical hernia   . Weight decrease   . Hyperlipidemia   . Arthritis   . Sleep apnea     SEVERE  per sleep study 3/12 EPIC  . Blood transfusion   . GERD (gastroesophageal reflux disease)   . Seasonal allergies   . Fibromyalgia     Dr Corliss Skains  . Hypertension     Dr Wylene Simmer LOV 4/13 on chart    Past Surgical History  Procedure Laterality Date  . Appendectomy    . Cholecystectomy    . Knee arthroplasty      bilateral  . Ankle surgery    . Knee infection      right knee hardware removed 11/12/   replaced 1/13  . Ventral hernia repair  07/21/2011    Procedure: LAPAROSCOPIC VENTRAL HERNIA;  Surgeon: Ernestene Mention, MD;  Location: [facility];  Service: General;  Laterality: N/A;  Laparoscopic Repair Verntal Incisional Hernia and Mesh    Current Outpatient Prescriptions  Medication Sig Dispense Refill  . amlodipine-atorvastatin (CADUET) 10-10 MG per tablet Take 1 tablet by mouth daily with breakfast.       . calcium gluconate 500 MG tablet Take 500 mg by mouth 3 (three)  times daily.       . celecoxib (CELEBREX) 200 MG capsule Take 200 mg by mouth daily with breakfast.       . cholecalciferol (VITAMIN D-400) 400 UNITS TABS Take 400 Units by mouth daily with breakfast.       . co-enzyme Q-10 30 MG capsule Take 30 mg by mouth 3 (three) times daily.       . DULoxetine (CYMBALTA) 30 MG capsule Take 30 mg by mouth daily with breakfast.       . esomeprazole (NEXIUM) 40 MG capsule Take 44 mg by mouth daily before breakfast. Using 22mg  OTC.      . ferrous fumarate (HEMOCYTE - 106 MG FE) 325 (106 FE) MG TABS Take 1 tablet by mouth every evening.       . fexofenadine (ALLEGRA) 180 MG tablet Take 180 mg by mouth daily with breakfast.       . gabapentin (NEURONTIN) 300 MG capsule Take 300 mg by mouth 2 (two) times daily.       . magnesium gluconate (MAGONATE) 500 MG tablet Take 500 mg by mouth 3 (three) times daily.       . Thiamine HCl (VITAMIN B-1) 100  MG tablet Take 100 mg by mouth daily with breakfast.       . traMADol (ULTRAM) 50 MG tablet Take 100 mg by mouth daily. For pain       No current facility-administered medications for this visit.    Family History  Problem Relation Age of Onset  . Lymphoma Mother     ROS:  Pertinent items are noted in HPI.  Otherwise, a comprehensive ROS was negative.  Exam:   BP 126/80  Pulse 80  Ht 5' 5.75" (1.67 m)  Wt 240 lb (108.863 kg)  BMI 39.03 kg/m2 Height: 5' 5.75" (167 cm)  Ht Readings from Last 3 Encounters:  04/10/13 5' 5.75" (1.67 m)  08/14/11 5\' 6"  (1.676 m)  07/21/11 5\' 6"  (1.676 m)    General appearance: alert, cooperative and appears stated age Head: Normocephalic, without obvious abnormality, atraumatic Neck: no adenopathy, supple, symmetrical, trachea midline and thyroid normal to inspection and palpation Lungs: clear to auscultation bilaterally Breasts: normal appearance, no masses or tenderness Heart: regular rate and rhythm Abdomen: soft, non-tender; no masses,  no organomegaly Extremities:  extremities normal, atraumatic, no cyanosis or edema Skin: Skin color, texture, turgor normal. No rashes or lesions Lymph nodes: Cervical, supraclavicular, and axillary nodes normal. No abnormal inguinal nodes palpated Neurologic: Grossly normal   Pelvic: External genitalia:  no lesions              Urethra:  normal appearing urethra with no masses, tenderness or lesions              Bartholin's and Skene's: normal                 Vagina: normal appearing vagina with normal color and discharge, no lesions              Cervix: anteverted              Pap taken: no Bimanual Exam:  Uterus:  normal size, contour, position, consistency, mobility, non-tender              Adnexa: no mass, fullness, tenderness               Rectovaginal: Confirms               Anus:  normal sphincter tone, no lesions  A:  Well Woman with normal exam  Postmenopausal  Bursitis of bilateral hips and knee pain  P:   Pap smear as per guidelines   Mammogram due in 1 year  Counseled on breast self exam, mammography screening, adequate intake of calcium and vitamin D, diet and exercise, Kegel's exercises return annually or prn  An After Visit Summary was printed and given to the patient.

## 2013-04-10 NOTE — Patient Instructions (Signed)

## 2013-04-12 NOTE — Progress Notes (Signed)
Reviewed personally.  M. Suzanne Keishon Chavarin, MD.  

## 2013-05-14 HISTORY — PX: HERNIA REPAIR: SHX51

## 2013-05-14 HISTORY — PX: CATARACT EXTRACTION: SUR2

## 2014-01-15 ENCOUNTER — Encounter: Payer: Self-pay | Admitting: Nurse Practitioner

## 2014-04-12 ENCOUNTER — Encounter: Payer: Self-pay | Admitting: Nurse Practitioner

## 2014-04-12 ENCOUNTER — Ambulatory Visit (INDEPENDENT_AMBULATORY_CARE_PROVIDER_SITE_OTHER): Payer: Medicare Other | Admitting: Nurse Practitioner

## 2014-04-12 VITALS — BP 116/78 | HR 68 | Ht 65.75 in | Wt 228.0 lb

## 2014-04-12 DIAGNOSIS — Z01419 Encounter for gynecological examination (general) (routine) without abnormal findings: Secondary | ICD-10-CM

## 2014-04-12 NOTE — Progress Notes (Signed)
Reviewed personally.  M. Suzanne Emberlyn Burlison, MD.  

## 2014-04-12 NOTE — Patient Instructions (Signed)

## 2014-04-12 NOTE — Progress Notes (Signed)
Patient ID: Miranda Romero, female   DOB: Jun 06, 1945, 69 y.o.   MRN: [id number] 69 y.o. G2P2 Married  Caucasian Fe here for annual exam.  She had second hernia surgery with mesh 3/15.  Bilateral cataract 04/2012.  She feels well except for fibromyalgia.  Has had to take pain med's more often.  Will rheumatologist while back home herein [place].  She will go back to [place] in 3 months.  Patient's last menstrual period was 09/20/2000.          Sexually active: Yes.    The current method of family planning is post menopausal status.    Exercising: No.  Exercise is limited by bone spur on heel.  Pt hopes to resume walking when weather is better.. Smoker:  no  Health Maintenance: Pap:  06/22/11, negative MMG:  04/05/13, Bi-Rads 2:  Benign findings, will schedule for 04/2014 Colonoscopy:  12/19/04, normal, repeat in 10 years  EDG with esophageal dilatation 10/2013 BMD:   03/2014, Dr. Wylene Simmerisovec TDaP:  03/2014 Labs:  03/2014 with PCP   reports that she has never smoked. She has never used smokeless tobacco. She reports that she drinks alcohol. She reports that she does not use illicit drugs.  Past Medical History  Diagnosis Date  . Umbilical hernia   . Weight decrease   . Hyperlipidemia   . Arthritis   . Sleep apnea     SEVERE  per sleep study 3/12 EPIC  . Blood transfusion   . GERD (gastroesophageal reflux disease)   . Seasonal allergies   . Fibromyalgia     Dr Corliss Skainseveshwar  . Hypertension     Dr Wylene Simmerisovec LOV 4/13 on chart    Past Surgical History  Procedure Laterality Date  . Appendectomy  2014  . Cholecystectomy    . Knee arthroplasty      bilateral  . Ankle surgery    . Knee infection      right knee hardware removed 11/12/   replaced 1/13  . Ventral hernia repair  07/21/2011    Procedure: LAPAROSCOPIC VENTRAL HERNIA;  Surgeon: Ernestene MentionHaywood M Ingram, MD;  Location: [facility];  Service: General;  Laterality: N/A;  Laparoscopic Repair Verntal Incisional Hernia and Mesh  . Hernia repair  05/2013    due to  appendectomy with mesh  . Cataract extraction Bilateral 05/2013    Current Outpatient Prescriptions  Medication Sig Dispense Refill  . amlodipine-atorvastatin (CADUET) 10-10 MG per tablet Take 1 tablet by mouth daily with breakfast.     . calcium gluconate 500 MG tablet Take 500 mg by mouth 3 (three) times daily.     . celecoxib (CELEBREX) 200 MG capsule Take 200 mg by mouth daily with breakfast.     . cholecalciferol (VITAMIN D-400) 400 UNITS TABS Take 400 Units by mouth daily with breakfast.     . co-enzyme Q-10 30 MG capsule Take 30 mg by mouth 3 (three) times daily.     . DULoxetine (CYMBALTA) 30 MG capsule Take 30 mg by mouth daily with breakfast.     . ferrous fumarate (HEMOCYTE - 106 MG FE) 325 (106 FE) MG TABS Take 1 tablet by mouth every evening.     . fexofenadine (ALLEGRA) 180 MG tablet Take 180 mg by mouth daily with breakfast.     . gabapentin (NEURONTIN) 300 MG capsule Take 300 mg by mouth 2 (two) times daily.     . magnesium gluconate (MAGONATE) 500 MG tablet Take 500 mg by mouth 3 (three)  times daily.     . Thiamine HCl (VITAMIN B-1) 100 MG tablet Take 100 mg by mouth daily with breakfast.     . traMADol (ULTRAM) 50 MG tablet Take 100 mg by mouth daily. For pain    . DEXILANT 60 MG capsule Take 1 tablet by mouth daily.     No current facility-administered medications for this visit.    Family History  Problem Relation Age of Onset  . Lymphoma Mother     ROS:  Pertinent items are noted in HPI.  Otherwise, a comprehensive ROS was negative.  Exam:   BP 116/78 mmHg  Pulse 68  Ht 5' 5.75" (1.67 m)  Wt 228 lb (103.42 kg)  BMI 37.08 kg/m2  LMP 09/20/2000 Height: 5' 5.75" (167 cm) Ht Readings from Last 3 Encounters:  04/12/14 5' 5.75" (1.67 m)  04/10/13 5' 5.75" (1.67 m)  08/14/11  (1.676 m)    General appearance: alert, cooperative and appears stated age Head: Normocephalic, without obvious abnormality, atraumatic Neck: no adenopathy, supple, symmetrical,  trachea midline and thyroid normal to inspection and palpation Lungs: clear to auscultation bilaterally Breasts: normal appearance, no masses or tenderness Heart: regular rate and rhythm Abdomen: soft, non-tender; no masses,  no organomegaly Extremities: extremities normal, atraumatic, no cyanosis or edema Skin: Skin color, texture, turgor normal. No rashes or lesions Lymph nodes: Cervical, supraclavicular, and axillary nodes normal. No abnormal inguinal nodes palpated Neurologic: Grossly normal   Pelvic: External genitalia:  no lesions              Urethra:  normal appearing urethra with no masses, tenderness or lesions              Bartholin's and Skene's: normal                 Vagina: normal appearing vagina with normal color and discharge, no lesions              Cervix: anteverted              Pap taken: No. Bimanual Exam:  Uterus:  normal size, contour, position, consistency, mobility, non-tender              Adnexa: no mass, fullness, tenderness               Rectovaginal: Confirms               Anus:  normal sphincter tone, no lesions  Chaperone present:  No  A:  Well Woman with normal exam  Postmenopausal off HRT 2002 Bursitis of bilateral hips and knee pain  Esophageal stricture with dilatation 10/2013   P:   Reviewed health and wellness pertinent to exam  Pap smear not taken today  Mammogram is due now and is scheduled  Counseled on breast self exam, mammography screening, adequate intake of calcium and vitamin D, diet and exercise, Kegel's exercises return annually or prn  An After Visit Summary was printed and given to the patient.

## 2015-01-17 DIAGNOSIS — K2100 Gastro-esophageal reflux disease with esophagitis, without bleeding: Secondary | ICD-10-CM | POA: Insufficient documentation

## 2015-03-25 DIAGNOSIS — K449 Diaphragmatic hernia without obstruction or gangrene: Secondary | ICD-10-CM | POA: Insufficient documentation

## 2015-04-15 ENCOUNTER — Telehealth: Payer: Self-pay | Admitting: Nurse Practitioner

## 2015-04-15 NOTE — Telephone Encounter (Signed)
Left message for appt/rd °

## 2015-04-16 ENCOUNTER — Encounter: Payer: Self-pay | Admitting: Nurse Practitioner

## 2015-04-16 ENCOUNTER — Ambulatory Visit (INDEPENDENT_AMBULATORY_CARE_PROVIDER_SITE_OTHER): Payer: Medicare Other | Admitting: Nurse Practitioner

## 2015-04-16 VITALS — BP 122/84 | HR 64 | Ht 65.75 in | Wt 236.0 lb

## 2015-04-16 DIAGNOSIS — Z0000 Encounter for general adult medical examination without abnormal findings: Secondary | ICD-10-CM

## 2015-04-16 DIAGNOSIS — Z01419 Encounter for gynecological examination (general) (routine) without abnormal findings: Secondary | ICD-10-CM | POA: Diagnosis not present

## 2015-04-16 NOTE — Progress Notes (Signed)
Patient ID: Miranda Romero, female   DOB: 08/30/1945, 70 y.o.   MRN: [id number]  70 y.o. G59P2002 Married  Caucasian Fe here for annual exam.  Will be going on a River Cruise to [place]. Will also be going to [place].  Saw Dr. Derrell Lolling and may consider  Hiatal Hernia surgery here vs. [facility].  Patient's last menstrual period was 09/20/2000 (exact date).          Sexually active: Yes.    The current method of family planning is post menopausal status.    Exercising: Yes.    returning to walking Smoker:  no  Health Maintenance: Pap:  06/22/11, Negative  MMG: 04/26/14, 3D, Bi-Rads 1: negative Colonoscopy:  12/19/04, repeat 10 years BMD: 04/09/14 T Score, -0.4 Spine / -1.5 Right Femur Neck / -0.3 Left Femur Neck TDaP: per PCP Shingles: does not apply Pneumonia: 04/16/08 Hep C; done today Labs: Dr. Wylene Simmer takes care of all labs   reports that she has never smoked. She has never used smokeless tobacco. She reports that she drinks alcohol. She reports that she does not use illicit drugs.  Past Medical History  Diagnosis Date  . Umbilical hernia   . Weight decrease   . Hyperlipidemia   . Arthritis   . Sleep apnea     SEVERE  per sleep study 3/12 EPIC  . Blood transfusion   . GERD (gastroesophageal reflux disease)   . Seasonal allergies   . Fibromyalgia     Dr Corliss Skains  . Hypertension     Dr Wylene Simmer LOV 4/13 on chart    Past Surgical History  Procedure Laterality Date  . Appendectomy  2014  . Cholecystectomy    . Knee arthroplasty      bilateral  . Ankle surgery    . Knee infection      right knee hardware removed 11/12/   replaced 1/13  . Ventral hernia repair  07/21/2011    Procedure: LAPAROSCOPIC VENTRAL HERNIA;  Surgeon: Ernestene Mention, MD;  Location: [facility];  Service: General;  Laterality: N/A;  Laparoscopic Repair Verntal Incisional Hernia and Mesh  . Hernia repair  05/2013    due to appendectomy with mesh  . Cataract extraction Bilateral 05/2013    Current  Outpatient Prescriptions  Medication Sig Dispense Refill  . amlodipine-atorvastatin (CADUET) 10-10 MG per tablet Take 1 tablet by mouth daily with breakfast.     . aspirin EC 81 MG tablet Take 1 tablet by mouth daily.    . calcium gluconate 500 MG tablet Take 500 mg by mouth 3 (three) times daily.     . celecoxib (CELEBREX) 200 MG capsule Take 200 mg by mouth daily with breakfast.     . cholecalciferol (VITAMIN D-400) 400 UNITS TABS Take 400 Units by mouth daily with breakfast.     . co-enzyme Q-10 30 MG capsule Take 30 mg by mouth 3 (three) times daily.     Marland Kitchen DEXILANT 60 MG capsule Take 1 tablet by mouth daily.    . DULoxetine (CYMBALTA) 30 MG capsule Take 30 mg by mouth daily with breakfast.     . ferrous fumarate (HEMOCYTE - 106 MG FE) 325 (106 FE) MG TABS Take 1 tablet by mouth every evening.     . fexofenadine (ALLEGRA) 180 MG tablet Take 180 mg by mouth daily with breakfast.     . gabapentin (NEURONTIN) 300 MG capsule Take 300 mg by mouth 2 (two) times daily.     Marland Kitchen  magnesium gluconate (MAGONATE) 500 MG tablet Take 500 mg by mouth 3 (three) times daily.     . Thiamine HCl (VITAMIN B-1) 100 MG tablet Take 100 mg by mouth daily with breakfast.     . traMADol (ULTRAM) 50 MG tablet Take 100 mg by mouth daily. For pain     No current facility-administered medications for this visit.    Family History  Problem Relation Age of Onset  . Lymphoma Mother     ROS:  Pertinent items are noted in HPI.  Otherwise, a comprehensive ROS was negative.  Exam:   BP 122/84 mmHg  Pulse 64  Ht 5' 5.75" (1.67 m)  Wt 236 lb (107.049 kg)  BMI 38.38 kg/m2  LMP 09/20/2000 (Exact Date) Height: 5' 5.75" (167 cm) Ht Readings from Last 3 Encounters:  04/16/15 5' 5.75" (1.67 m)  04/12/14 5' 5.75" (1.67 m)  04/10/13 5' 5.75" (1.67 m)    General appearance: alert, cooperative and appears stated age Head: Normocephalic, without obvious abnormality, atraumatic Neck: no adenopathy, supple, symmetrical,  trachea midline and thyroid normal to inspection and palpation Lungs: clear to auscultation bilaterally Breasts: normal appearance, no masses or tenderness Heart: regular rate and rhythm Abdomen: soft, non-tender; no masses,  no organomegaly Extremities: extremities normal, atraumatic, no cyanosis or edema Skin: Skin color, texture, turgor normal. No rashes or lesions Lymph nodes: Cervical, supraclavicular, and axillary nodes normal. No abnormal inguinal nodes palpated Neurologic: Grossly normal   Pelvic: External genitalia:  no lesions              Urethra:  normal appearing urethra with no masses, tenderness or lesions              Bartholin's and Skene's: normal                 Vagina: normal appearing vagina with normal color and discharge, no lesions              Cervix: anteverted              Pap taken: No. Bimanual Exam:  Uterus:  normal size, contour, position, consistency, mobility, non-tender              Adnexa: no mass, fullness, tenderness               Rectovaginal: Confirms               Anus:  normal sphincter tone, no lesions  Chaperone present: no  A:  Well Woman with normal exam  Postmenopausal off HRT 2002 Bursitis of bilateral hips and knee pain Esophageal stricture with dilatation 10/2013 and needs to have hiatal hernia repair   P:   Reviewed health and wellness pertinent to exam  Pap smear as above  Mammogram is due next month and will schedule  Follow with labs  Counseled on breast self exam, mammography screening, adequate intake of calcium and vitamin D, diet and exercise return annually or prn  An After Visit Summary was printed and given to the patient.

## 2015-04-16 NOTE — Progress Notes (Signed)
Encounter reviewed by Dr. Hoyt Leanos Amundson C. Silva.  

## 2015-04-16 NOTE — Patient Instructions (Signed)

## 2015-04-17 LAB — HEPATITIS C ANTIBODY: HCV Ab: NEGATIVE

## 2015-04-30 ENCOUNTER — Other Ambulatory Visit (HOSPITAL_BASED_OUTPATIENT_CLINIC_OR_DEPARTMENT_OTHER): Payer: Self-pay | Admitting: General Surgery

## 2015-05-24 ENCOUNTER — Other Ambulatory Visit: Payer: Self-pay | Admitting: Gastroenterology

## 2015-05-29 ENCOUNTER — Encounter (HOSPITAL_COMMUNITY): Payer: Self-pay | Admitting: *Deleted

## 2015-05-31 ENCOUNTER — Other Ambulatory Visit: Payer: Self-pay | Admitting: Gastroenterology

## 2015-06-05 ENCOUNTER — Encounter (HOSPITAL_COMMUNITY): Payer: Self-pay

## 2015-06-05 ENCOUNTER — Ambulatory Visit (HOSPITAL_COMMUNITY): Payer: Medicare Other | Admitting: Anesthesiology

## 2015-06-05 ENCOUNTER — Ambulatory Visit (HOSPITAL_COMMUNITY)
Admission: RE | Admit: 2015-06-05 | Discharge: 2015-06-05 | Disposition: A | Discharge status: Home / Self Care | Payer: Medicare Other | Source: Ambulatory Visit | Attending: Gastroenterology | Admitting: Gastroenterology

## 2015-06-05 ENCOUNTER — Encounter (HOSPITAL_COMMUNITY)
Admission: RE | Disposition: A | Discharge status: Home / Self Care | Payer: Self-pay | Source: Ambulatory Visit | Attending: Gastroenterology

## 2015-06-05 DIAGNOSIS — I10 Essential (primary) hypertension: Secondary | ICD-10-CM | POA: Diagnosis not present

## 2015-06-05 DIAGNOSIS — M797 Fibromyalgia: Secondary | ICD-10-CM | POA: Insufficient documentation

## 2015-06-05 DIAGNOSIS — K219 Gastro-esophageal reflux disease without esophagitis: Secondary | ICD-10-CM | POA: Insufficient documentation

## 2015-06-05 DIAGNOSIS — R1310 Dysphagia, unspecified: Secondary | ICD-10-CM | POA: Diagnosis present

## 2015-06-05 DIAGNOSIS — Z79899 Other long term (current) drug therapy: Secondary | ICD-10-CM | POA: Insufficient documentation

## 2015-06-05 DIAGNOSIS — K449 Diaphragmatic hernia without obstruction or gangrene: Secondary | ICD-10-CM | POA: Insufficient documentation

## 2015-06-05 DIAGNOSIS — G4730 Sleep apnea, unspecified: Secondary | ICD-10-CM | POA: Diagnosis not present

## 2015-06-05 DIAGNOSIS — G4733 Obstructive sleep apnea (adult) (pediatric): Secondary | ICD-10-CM | POA: Diagnosis not present

## 2015-06-05 DIAGNOSIS — K222 Esophageal obstruction: Secondary | ICD-10-CM | POA: Insufficient documentation

## 2015-06-05 HISTORY — PX: BALLOON DILATION: SHX5330

## 2015-06-05 HISTORY — PX: ESOPHAGOGASTRODUODENOSCOPY (EGD) WITH PROPOFOL: SHX5813

## 2015-06-05 HISTORY — DX: Personal history of other medical treatment: Z92.89

## 2015-06-05 SURGERY — ESOPHAGOGASTRODUODENOSCOPY (EGD) WITH PROPOFOL
Anesthesia: Monitor Anesthesia Care

## 2015-06-05 MED ORDER — BUTAMBEN-TETRACAINE-BENZOCAINE 2-2-14 % EX AERO
INHALATION_SPRAY | CUTANEOUS | Status: DC | PRN
  Administered 2015-06-05: 2 via TOPICAL

## 2015-06-05 MED ORDER — FENTANYL CITRATE (PF) 100 MCG/2ML IJ SOLN: 25.0000 ug | INTRAMUSCULAR | Status: DC | PRN

## 2015-06-05 MED ORDER — SODIUM CHLORIDE 0.9 % IV SOLN: INTRAVENOUS | Status: DC

## 2015-06-05 MED ORDER — PROPOFOL 10 MG/ML IV BOLUS
INTRAVENOUS | Status: AC
  Filled 2015-06-05: qty 40

## 2015-06-05 MED ORDER — PROPOFOL 10 MG/ML IV BOLUS
INTRAVENOUS | Status: DC | PRN
  Administered 2015-06-05 (×2): 30 mg via INTRAVENOUS

## 2015-06-05 MED ORDER — LACTATED RINGERS IV SOLN
INTRAVENOUS | Status: DC
  Administered 2015-06-05: 1000 mL via INTRAVENOUS

## 2015-06-05 MED ORDER — PROPOFOL 10 MG/ML IV BOLUS
INTRAVENOUS | Status: AC
  Filled 2015-06-05: qty 20

## 2015-06-05 MED ORDER — PROPOFOL 500 MG/50ML IV EMUL
INTRAVENOUS | Status: DC | PRN
  Administered 2015-06-05: 150 ug/kg/min via INTRAVENOUS

## 2015-06-05 SURGICAL SUPPLY — 15 items

## 2015-06-05 NOTE — Anesthesia Postprocedure Evaluation (Signed)
Anesthesia Post Note  Patient: Miranda Romero  Procedure(s) Performed: Procedure(s) (LRB): ESOPHAGOGASTRODUODENOSCOPY (EGD) WITH PROPOFOL (N/A) BALLOON DILATION (N/A)  Patient location during evaluation: PACU Anesthesia Type: General Level of consciousness: awake and alert Pain management: pain level controlled Vital Signs Assessment: post-procedure vital signs reviewed and stable Respiratory status: spontaneous breathing, nonlabored ventilation, respiratory function stable and patient connected to nasal cannula oxygen Cardiovascular status: blood pressure returned to baseline and stable Postop Assessment: no signs of nausea or vomiting Anesthetic complications: no    Last Vitals:  Filed Vitals:   06/05/15 0945 06/05/15 0950  BP:  162/91  Pulse: 81 78  Temp:    Resp: 16 12    Last Pain: There were no vitals filed for this visit.               Derell Bruun L

## 2015-06-05 NOTE — Anesthesia Preprocedure Evaluation (Signed)
Anesthesia Evaluation  Patient identified by MRN, date of birth, ID band Patient awake    Reviewed: Allergy & Precautions, H&P , NPO status , Patient's Chart, lab work & pertinent test results  Airway Mallampati: II  TM Distance: >3 FB Neck ROM: Full    Dental no notable dental hx. (+) Dental Advisory Given, Teeth Intact   Pulmonary neg pulmonary ROS, sleep apnea and Continuous Positive Airway Pressure Ventilation ,  Severe OSA   Pulmonary exam normal breath sounds clear to auscultation       Cardiovascular hypertension, Pt. on medications negative cardio ROS Normal cardiovascular exam Rhythm:Regular Rate:Normal     Neuro/Psych  Neuromuscular disease negative neurological ROS  negative psych ROS   GI/Hepatic negative GI ROS, Neg liver ROS, GERD  ,  Endo/Other  negative endocrine ROS  Renal/GU negative Renal ROS  negative genitourinary   Musculoskeletal negative musculoskeletal ROS (+) Fibromyalgia -  Abdominal   Peds negative pediatric ROS (+)  Hematology negative hematology ROS (+)   Anesthesia Other Findings   Reproductive/Obstetrics negative OB ROS                             Anesthesia Physical Anesthesia Plan  ASA: III  Anesthesia Plan: MAC   Post-op Pain Management:    Induction:   Airway Management Planned:   Additional Equipment:   Intra-op Plan:   Post-operative Plan:   Informed Consent:   Plan Discussed with: Surgeon  Anesthesia Plan Comments:         Anesthesia Quick Evaluation

## 2015-06-05 NOTE — Transfer of Care (Signed)
Immediate Anesthesia Transfer of Care Note  Patient: Miranda Romero  Procedure(s) Performed: Procedure(s): ESOPHAGOGASTRODUODENOSCOPY (EGD) WITH PROPOFOL (N/A) BALLOON DILATION (N/A)  Patient Location: PACU  Anesthesia Type:MAC  Level of Consciousness: awake and alert   Airway & Oxygen Therapy: Patient Spontanous Breathing and Patient connected to nasal cannula oxygen  Post-op Assessment: Report given to RN and Post -op Vital signs reviewed and stable  Post vital signs: Reviewed and stable  Last Vitals:  Filed Vitals:   06/05/15 0751  BP: 170/93  Pulse: 87  Temp: 36.7 C  Resp: 14    Complications: No apparent anesthesia complications

## 2015-06-05 NOTE — Discharge Instructions (Signed)

## 2015-06-05 NOTE — H&P (Signed)
Patient interval history reviewed.  Patient examined again.  There has been no change from documented H/P dated 05/17/15 (scanned into chart from our office) except as documented above.  Assessment:  1.  Dysphagia. 2.  History large hiatal hernia and esophageal stricture.  Plan:  1.  Endoscopy with possible esophageal (balloon) dilatation. 2.  Risks (bleeding, infection, bowel perforation that could require surgery, sedation-related changes in cardiopulmonary systems), benefits (identification and possible treatment of source of symptoms, exclusion of certain causes of symptoms), and alternatives (watchful waiting, radiographic imaging studies, empiric medical treatment) of upper endoscopy with esophageal dilatation (EGD +/- DIL) were explained to patient/family in detail and patient wishes to proceed.

## 2015-06-05 NOTE — Op Note (Signed)
[facility] Patient Name: Miranda Romero Procedure Date: 06/05/2015 MRN: [id number] Attending MD: Willis Modena , MD Date of Birth: 06/26/1945 CSN:  Age: 70 Admit Type: Outpatient Procedure:                Upper GI endoscopy Indications:              Dysphagia, Abnormal UGI series Providers:                Willis Modena, MD, Waynard Edwards, RN, Darletta Moll,                            Technician Referring MD:             Claud Kelp, MD Medicines:                Monitored Anesthesia Care, Cetacaine spray Complications:            No immediate complications. Estimated Blood Loss:     Estimated blood loss: none. Procedure:                Pre-Anesthesia Assessment:                           - Prior to the procedure, a History and Physical                            was performed, and patient medications and                            allergies were reviewed. The patient's tolerance of                            previous anesthesia was also reviewed. The risks                            and benefits of the procedure and the sedation                            options and risks were discussed with the patient.                            All questions were answered, and informed consent                            was obtained. Prior Anticoagulants: The patient has                            taken aspirin. ASA Grade Assessment: III - A                            patient with severe systemic disease. After                            reviewing the risks and benefits, the patient was  deemed in satisfactory condition to undergo the                            procedure.                           After obtaining informed consent, the endoscope was                            passed under direct vision. Throughout the                            procedure, the patient's blood pressure, pulse, and                            oxygen saturations were  monitored continuously. The                            EG-[id number] ([id number]) scope was introduced through the                            mouth, and advanced to the second part of duodenum.                            The upper GI endoscopy was accomplished without                            difficulty. The patient tolerated the procedure                            well. Scope In: Scope Out: Findings:      A 9 cm hiatal hernia was present.      The distal esophagus was moderately tortuous.      One moderate benign-appearing, intrinsic stenosis was found. A TTS       dilator was passed through the scope. Dilation with a 15-16.5-18 mm       balloon dilator was performed to 16.5 mm.      The exam of the esophagus was otherwise normal.      The entire examined stomach was normal.      The duodenal bulb, first portion of the duodenum and second portion of       the duodenum were normal. Impression:               - 9 cm hiatal hernia.                           - Tortuous esophagus.                           - Benign-appearing esophageal stenosis. Dilated.                           - Normal stomach.                           - Normal duodenal bulb, first portion of the  duodenum and second portion of the duodenum.                           - The examination was otherwise normal. Moderate Sedation:      N/A- Per Anesthesia Care Recommendation:           - Patient has a contact number available for                            emergencies. The signs and symptoms of potential                            delayed complications were discussed with the                            patient. Return to normal activities tomorrow.                            Written discharge instructions were provided to the                            patient.                           - Discharge patient to home (ambulatory).                           - Soft diet today.                           -  Continue present medications.                           - Return to GI clinic PRN.                           - Return to referring physician as previously                            scheduled.                           - Follow clinical response to esophageal dilatation. Procedure Code(s):        --- Professional ---                           [phone], Esophagogastroduodenoscopy, flexible,                            transoral; with transendoscopic balloon dilation of                            esophagus (less than 30 mm diameter) Diagnosis Code(s):        --- Professional ---                           K44.9, Diaphragmatic hernia without obstruction  or                            gangrene                           Q39.9, Congenital malformation of esophagus,                            unspecified                           K22.2, Esophageal obstruction                           R13.10, Dysphagia, unspecified                           R93.3, Abnormal findings on diagnostic imaging of                            other parts of digestive tract CPT copyright 2016 [employer]. All rights reserved. The codes documented in this report are preliminary and upon coder review may  be revised to meet current compliance requirements. Willis ModenaWilliam Apostolos Blagg, MD Willis ModenaWilliam Itxel Wickard, MD 06/05/2015 9:05:38 AM This report has been signed electronically. Number of Addenda: 0

## 2015-06-06 ENCOUNTER — Encounter (HOSPITAL_COMMUNITY): Payer: Self-pay | Admitting: Gastroenterology

## 2015-06-07 ENCOUNTER — Other Ambulatory Visit: Payer: Self-pay | Admitting: General Surgery

## 2015-08-28 ENCOUNTER — Other Ambulatory Visit: Payer: Self-pay | Admitting: General Surgery

## 2015-10-03 ENCOUNTER — Other Ambulatory Visit: Payer: Self-pay | Admitting: General Surgery

## 2015-11-01 ENCOUNTER — Encounter (HOSPITAL_COMMUNITY): Payer: Self-pay | Admitting: *Deleted

## 2015-11-01 NOTE — Patient Instructions (Signed)
[place]  11/01/2015   Your procedure is scheduled on: 11/06/2015    Report to [facility]  Entrance [place]  elevators to 3rd floor to  Short Stay Center at   0630 AM.  Call this number if you have problems the morning of surgery [phone]   Remember: ONLY 1 PERSON MAY GO WITH YOU TO SHORT STAY TO GET  READY MORNING OF YOUR SURGERY.  Do not eat food or drink liquids :After Midnight.     Take these medicines the morning of surgery with A SIP OF WATER: Caduet, Dexilant, Cymbalta, Allegra                                 You may not have any metal on your body including hair pins and              piercings  Do not wear jewelry, make-up, lotions, powders or perfumes, deodorant             Do not wear nail polish.  Do not shave  48 hours prior to surgery.     Do not bring valuables to the hospital. [facility] IS NOT             RESPONSIBLE   FOR VALUABLES.  Contacts, dentures or bridgework may not be worn into surgery.  Leave suitcase in the car. After surgery it may be brought to your room.       Special Instructions: coughing and deep breathing exercises, leg exercises               Please read over the following fact sheets you were given: _____________________________________________________________________             [facility] - Preparing for [facility] Before surgery, you can play an important role.  Because skin is not sterile, your skin needs to be as free of germs as possible.  You can reduce the number of germs on your skin by washing with CHG (chlorahexidine gluconate) soap before surgery.  CHG is an antiseptic cleaner which kills germs and bonds with the skin to continue killing germs even after washing. Please DO NOT use if you have an allergy to CHG or antibacterial soaps.  If your skin becomes reddened/irritated stop using the CHG and inform your nurse when you arrive at Short Stay. Do not shave (including legs and  underarms) for at least 48 hours prior to the first CHG shower.  You may shave your face/neck. Please follow these instructions carefully:  1.  Shower with CHG Soap the night before surgery and the  morning of Surgery.  2.  If you choose to wash your hair, wash your hair first as usual with your  normal  shampoo.  3.  After you shampoo, rinse your hair and body thoroughly to remove the  shampoo.                           4.  Use CHG as you would any other liquid soap.  You can apply chg directly  to the skin and wash                       Gently with a scrungie or clean washcloth.  5.  Apply  the CHG Soap to your body ONLY FROM THE NECK DOWN.   Do not use on face/ open                           Wound or open sores. Avoid contact with eyes, ears mouth and genitals (private parts).                       Wash face,  Genitals (private parts) with your normal soap.             6.  Wash thoroughly, paying special attention to the area where your surgery  will be performed.  7.  Thoroughly rinse your body with warm water from the neck down.  8.  DO NOT shower/wash with your normal soap after using and rinsing off  the CHG Soap.                9.  Pat yourself dry with a clean towel.            10.  Wear clean pajamas.            11.  Place clean sheets on your bed the night of your first shower and do not  sleep with pets. Day of Surgery : Do not apply any lotions/deodorants the morning of surgery.  Please wear clean clothes to the hospital/surgery center.  FAILURE TO FOLLOW THESE INSTRUCTIONS MAY RESULT IN THE CANCELLATION OF YOUR SURGERY PATIENT SIGNATURE_________________________________  NURSE SIGNATURE__________________________________  ________________________________________________________________________

## 2015-11-01 NOTE — Progress Notes (Signed)
Do you want me to use the consent order dated 08/28/2015 or the consent order dated 10/03/15.  preop on 11/04/15 at 100pm.  Thank You.

## 2015-11-04 ENCOUNTER — Encounter (HOSPITAL_COMMUNITY): Payer: Self-pay

## 2015-11-04 ENCOUNTER — Ambulatory Visit (HOSPITAL_COMMUNITY)
Admission: RE | Admit: 2015-11-04 | Discharge: 2015-11-04 | Disposition: A | Discharge status: Home / Self Care | Payer: Medicare Other | Source: Ambulatory Visit | Attending: General Surgery | Admitting: General Surgery

## 2015-11-04 ENCOUNTER — Encounter (HOSPITAL_COMMUNITY)
Admission: RE | Admit: 2015-11-04 | Discharge: 2015-11-04 | Disposition: A | Discharge status: Home / Self Care | Payer: Medicare Other | Source: Ambulatory Visit | Attending: General Surgery | Admitting: General Surgery

## 2015-11-04 DIAGNOSIS — I10 Essential (primary) hypertension: Secondary | ICD-10-CM | POA: Insufficient documentation

## 2015-11-04 DIAGNOSIS — Z01818 Encounter for other preprocedural examination: Secondary | ICD-10-CM | POA: Insufficient documentation

## 2015-11-04 DIAGNOSIS — K449 Diaphragmatic hernia without obstruction or gangrene: Secondary | ICD-10-CM | POA: Insufficient documentation

## 2015-11-04 HISTORY — DX: Personal history of other diseases of the digestive system: Z87.19

## 2015-11-04 LAB — CBC WITH DIFFERENTIAL/PLATELET
BASOS ABS: 0 10*3/uL (ref 0.0–0.1)
BASOS PCT: 1 %
Eosinophils Absolute: 0.2 10*3/uL (ref 0.0–0.7)
Eosinophils Relative: 2 %
HEMATOCRIT: 40.5 % (ref 36.0–46.0)
Hemoglobin: 13.8 g/dL (ref 12.0–15.0)
Lymphocytes Relative: 26 %
Lymphs Abs: 2 10*3/uL (ref 0.7–4.0)
MCH: 30.8 pg (ref 26.0–34.0)
MCHC: 34.1 g/dL (ref 30.0–36.0)
MCV: 90.4 fL (ref 78.0–100.0)
MONO ABS: 0.4 10*3/uL (ref 0.1–1.0)
Monocytes Relative: 5 %
NEUTROS ABS: 5.1 10*3/uL (ref 1.7–7.7)
NEUTROS PCT: 66 %
Platelets: 369 10*3/uL (ref 150–400)
RBC: 4.48 MIL/uL (ref 3.87–5.11)
RDW: 13.6 % (ref 11.5–15.5)
WBC: 7.7 10*3/uL (ref 4.0–10.5)

## 2015-11-04 LAB — COMPREHENSIVE METABOLIC PANEL
ALBUMIN: 4.4 g/dL (ref 3.5–5.0)
ALT: 23 U/L (ref 14–54)
AST: 24 U/L (ref 15–41)
Alkaline Phosphatase: 77 U/L (ref 38–126)
Anion gap: 9 (ref 5–15)
BILIRUBIN TOTAL: 0.6 mg/dL (ref 0.3–1.2)
BUN: 17 mg/dL (ref 6–20)
CHLORIDE: 104 mmol/L (ref 101–111)
CO2: 29 mmol/L (ref 22–32)
CREATININE: 0.98 mg/dL (ref 0.44–1.00)
Calcium: 9.7 mg/dL (ref 8.9–10.3)
GFR calc Af Amer: >60 mL/min (ref >60)
GFR calc non Af Amer: 57 mL/min — ABNORMAL LOW (ref >60)
GLUCOSE: 97 mg/dL (ref 65–99)
POTASSIUM: 4 mmol/L (ref 3.5–5.1)
Sodium: 142 mmol/L (ref 135–145)
Total Protein: 8.1 g/dL (ref 6.5–8.1)

## 2015-11-05 NOTE — Progress Notes (Signed)
Final EKg in EPIC done 11/04/15

## 2015-11-05 NOTE — H&P (Signed)
[place] F. Matul[place] Location: [facility] Patient #: [id number] DOB: 05/24/1945 Married / Language: English / Race: White Female       History of Present Illness  Patient words: pre op.  The patient is a 70 year old female who presents with a complaint of hiatal hernia. This is a 70 year old Caucasian female who comes with her husband for a preop visit. She is scheduled for repair of her sliding hiatal hernia and fundop[facility]  She's had symptoms for some time which have been documented. She had an upper GI [place] [facility] on February 13, 2015 which shows a 9 cm hiatal hernia but no ulceration or obstruction. About one third of the upper stomach was in the chest. She had upper endoscopy and esophageal dilatation by Dr. Dulce Sellaroutlaw on June 05, 2015. He found a single benign-appearing esophageal stricture, somewhat tortuous esophagus and everything is looked normal. Her manometry report from March 12, 2015 shows esophageal motility normal with 7 out of 10 swallows propagating. They did note elevated LES pressure and abnormal relaxation which led to the recent endoscopy and esophageal dilatation. She takes dexilant.  She currently has minimal dysphagia. She has intentionally lost 10 pounds in the past 5 months. No change in her medical condition or symptoms otherwise. She is ready to go.  Comorbidities include BMI of 35, chronic cough, obstructive sleep apnea. DJD with bilateral total knee replacement. Laparoscopic appendectomy by Dr. Magnus IvanBlackman. Open cholecystectomy by Dr. Alfonzo FellerBleivernicht. History of periumbilical ventral hernia repair by me with 20 cm x 15 cm mesh. She recovered from all of those surgeries. She does this increases her risk of adhesions. Increases risk from telemetry but nevertheless most likely left subcostal incision would work. Low but definite chance of open surgery.  She is scheduled for laparoscopic reduction and  repair of hiatal hernia, possible mesh, Nissen fundoplication in about 4 weeks. She understands all of the indications details and risks of the surgery and she understands she'll have to alter her diet temporarily.   Allergies  Sudafed Congestion *NASAL AGENTS - SYSTEMIC AND TOPICAL* Anaphylaxis.  Medication History  Caduet (10-10MG  Tablet, Oral) Active. Calcium (500MG  Tablet, Oral) Active. CeleBREX (200MG  Capsule, Oral) Active. Vitamin D (Cholecalciferol) (400UNIT Tablet, Oral) Active. Coenzyme Q10 (30MG  Capsule, Oral) Active. Dexilant (60MG  Capsule DR, Oral) Active. Cymbalta (30MG  Capsule DR Part, Oral) Active. Ferrous Fumarate-DSS (150-100MG  Tablet, Oral) Active. Allegra (180MG  Tablet, Oral) Active. Gabapentin (300MG  Capsule, Oral) Active. Magonate (500 (27 Mg)MG Tablet, Oral) Active. Vitamin B-1 (100MG  Tablet, Oral) Active. TraMADol HCl (50MG  Tablet, Oral) Active. Medications Reconciled  Vitals  Weight: 222 lb Height: 66in Body Surface Area: 2.09 m Body Mass Index: 35.83 kg/m  Temp.: 97.33F  Pulse: 94 (Regular)  BP: 146/94 (Sitting, Left Arm, Standard)   Physical Exam  General Mental Status-Alert. General Appearance-Not in acute distress. Build & Nutrition-Well nourished. Posture-Normal posture. Gait-Normal.  Head and Neck Head-normocephalic, atraumatic with no lesions or palpable masses. Trachea-midline. Thyroid Gland Characteristics - normal size and consistency and no palpable nodules.  Chest and Lung Exam Chest and lung exam reveals -on auscultation, normal breath sounds, no adventitious sounds and normal vocal resonance.  Cardiovascular Cardiovascular examination reveals -normal heart sounds, regular rate and rhythm with no murmurs and femoral artery auscultation bilaterally reveals normal pulses, no bruits, no thrills.  Abdomen Inspection Inspection of the abdomen reveals - No  Hernias. Palpation/Percussion Palpation and Percussion of the abdomen reveal - Soft, Non Tender, No Rigidity (guarding), No hepatosplenomegaly  and No Palpable abdominal masses. Note: Right subcostal scar well-healed. Multiple trocar sites well-healed. No evidence of recurrent hernia.   Neurologic Neurologic evaluation reveals -alert and oriented x 3 with no impairment of recent or remote memory, normal attention span and ability to concentrate, normal sensation and normal coordination.  Musculoskeletal Normal Exam - Bilateral-Upper Extremity Strength Normal and Lower Extremity Strength Normal.    Assessment & Plan  HIATAL HERNIA WITH GERD (K21.9) Current Plans Schedule for Surgery  Your preop follow-up exam today is normal. I am encouraged that you have lost 10 pounds  You are scheduled for laparoscopic [facility] 23 He will be scheduled for a preop appointment showed a preop for lab work and anesthesia consultation. Everything seems to be in order.  OBSTRUCTIVE SLEEP APNEA (G47.33) CHRONIC COUGH (R05) BMI 38.0-38.9,ADULT (Z68.38) HISTORY OF KNEE REPLACEMENT, TOTAL, BILATERAL (Z96.653) HISTORY OF INCISIONAL HERNIA REPAIR (Z98.890) HISTORY OF LAPAROSCOPIC APPENDECTOMY (Z90.49) HISTORY OF CHOLECYSTECTOMY (Z90.49)    Angelia MouldHaywood M. Derrell LollingIngram, M.D., [facility] General and Minimally invasive Surgery Breast and Colorectal Surgery Office:   [phone] Pager:   [phone]

## 2015-11-06 ENCOUNTER — Ambulatory Visit (HOSPITAL_COMMUNITY)
Admission: RE | Admit: 2015-11-06 | Discharge: 2015-11-08 | Disposition: A | Discharge status: Home / Self Care | Payer: Medicare Other | Source: Ambulatory Visit | Attending: General Surgery | Admitting: General Surgery

## 2015-11-06 ENCOUNTER — Ambulatory Visit (HOSPITAL_COMMUNITY): Payer: Medicare Other | Admitting: Anesthesiology

## 2015-11-06 ENCOUNTER — Encounter (HOSPITAL_COMMUNITY): Payer: Self-pay | Admitting: Registered Nurse

## 2015-11-06 ENCOUNTER — Encounter (HOSPITAL_COMMUNITY)
Admission: RE | Disposition: A | Discharge status: Home / Self Care | Payer: Self-pay | Source: Ambulatory Visit | Attending: General Surgery

## 2015-11-06 DIAGNOSIS — K449 Diaphragmatic hernia without obstruction or gangrene: Secondary | ICD-10-CM | POA: Diagnosis not present

## 2015-11-06 DIAGNOSIS — Z79899 Other long term (current) drug therapy: Secondary | ICD-10-CM | POA: Insufficient documentation

## 2015-11-06 DIAGNOSIS — E669 Obesity, unspecified: Secondary | ICD-10-CM | POA: Insufficient documentation

## 2015-11-06 DIAGNOSIS — Z6838 Body mass index (BMI) 38.0-38.9, adult: Secondary | ICD-10-CM | POA: Insufficient documentation

## 2015-11-06 DIAGNOSIS — Z96653 Presence of artificial knee joint, bilateral: Secondary | ICD-10-CM | POA: Diagnosis not present

## 2015-11-06 DIAGNOSIS — R000 Tachycardia, unspecified: Secondary | ICD-10-CM | POA: Insufficient documentation

## 2015-11-06 DIAGNOSIS — R05 Cough: Secondary | ICD-10-CM | POA: Insufficient documentation

## 2015-11-06 DIAGNOSIS — I10 Essential (primary) hypertension: Secondary | ICD-10-CM | POA: Insufficient documentation

## 2015-11-06 DIAGNOSIS — Z791 Long term (current) use of non-steroidal anti-inflammatories (NSAID): Secondary | ICD-10-CM | POA: Insufficient documentation

## 2015-11-06 DIAGNOSIS — M1990 Unspecified osteoarthritis, unspecified site: Secondary | ICD-10-CM | POA: Insufficient documentation

## 2015-11-06 DIAGNOSIS — Z9889 Other specified postprocedural states: Secondary | ICD-10-CM

## 2015-11-06 DIAGNOSIS — K222 Esophageal obstruction: Secondary | ICD-10-CM | POA: Insufficient documentation

## 2015-11-06 DIAGNOSIS — D72829 Elevated white blood cell count, unspecified: Secondary | ICD-10-CM | POA: Insufficient documentation

## 2015-11-06 DIAGNOSIS — G4733 Obstructive sleep apnea (adult) (pediatric): Secondary | ICD-10-CM | POA: Insufficient documentation

## 2015-11-06 DIAGNOSIS — K219 Gastro-esophageal reflux disease without esophagitis: Secondary | ICD-10-CM

## 2015-11-06 HISTORY — DX: Diaphragmatic hernia without obstruction or gangrene: K44.9

## 2015-11-06 HISTORY — DX: Gastro-esophageal reflux disease without esophagitis: K21.9

## 2015-11-06 HISTORY — PX: HIATAL HERNIA REPAIR: SHX195

## 2015-11-06 LAB — CBC
HEMATOCRIT: 36.8 % (ref 36.0–46.0)
Hemoglobin: 12.4 g/dL (ref 12.0–15.0)
MCH: 30.8 pg (ref 26.0–34.0)
MCHC: 33.7 g/dL (ref 30.0–36.0)
MCV: 91.5 fL (ref 78.0–100.0)
PLATELETS: 324 10*3/uL (ref 150–400)
RBC: 4.02 MIL/uL (ref 3.87–5.11)
RDW: 13.6 % (ref 11.5–15.5)
WBC: 17.2 10*3/uL — ABNORMAL HIGH (ref 4.0–10.5)

## 2015-11-06 LAB — CREATININE, SERUM
CREATININE: 0.79 mg/dL (ref 0.44–1.00)
GFR calc Af Amer: >60 mL/min (ref >60)
GFR calc non Af Amer: >60 mL/min (ref >60)

## 2015-11-06 SURGERY — REPAIR, HERNIA, HIATAL
Anesthesia: General

## 2015-11-06 MED ORDER — ONDANSETRON HCL 4 MG/2ML IJ SOLN
INTRAMUSCULAR | Status: AC
  Filled 2015-11-06: qty 2

## 2015-11-06 MED ORDER — PROPOFOL 10 MG/ML IV BOLUS
INTRAVENOUS | Status: AC
  Filled 2015-11-06: qty 20

## 2015-11-06 MED ORDER — FENTANYL CITRATE (PF) 100 MCG/2ML IJ SOLN
25.0000 ug | INTRAMUSCULAR | Status: DC | PRN
  Administered 2015-11-06: 50 ug via INTRAVENOUS

## 2015-11-06 MED ORDER — GLYCOPYRROLATE 0.2 MG/ML IJ SOLN
INTRAMUSCULAR | Status: DC | PRN
  Administered 2015-11-06: 0.1 mg via INTRAVENOUS

## 2015-11-06 MED ORDER — SUGAMMADEX SODIUM 200 MG/2ML IV SOLN
INTRAVENOUS | Status: DC | PRN
  Administered 2015-11-06: 200 mg via INTRAVENOUS

## 2015-11-06 MED ORDER — ACETAMINOPHEN 10 MG/ML IV SOLN
INTRAVENOUS | Status: AC
  Filled 2015-11-06: qty 100

## 2015-11-06 MED ORDER — TAVABOROLE 5 % EX SOLN: 1.0000 "application " | Freq: Every day | CUTANEOUS | Status: DC

## 2015-11-06 MED ORDER — FAMOTIDINE IN NACL 20-0.9 MG/50ML-% IV SOLN
20.0000 mg | Freq: Two times a day (BID) | INTRAVENOUS | Status: DC
Start: 2015-11-06 — End: 2015-11-08
  Administered 2015-11-06 – 2015-11-08 (×5): 20 mg via INTRAVENOUS
  Filled 2015-11-06 (×5): qty 50

## 2015-11-06 MED ORDER — BUPIVACAINE-EPINEPHRINE 0.5% -1:200000 IJ SOLN
INTRAMUSCULAR | Status: DC | PRN
  Administered 2015-11-06: 28 mL

## 2015-11-06 MED ORDER — CEFAZOLIN SODIUM-DEXTROSE 2-4 GM/100ML-% IV SOLN
2.0000 g | INTRAVENOUS | Status: AC
  Administered 2015-11-06: 2 g via INTRAVENOUS
  Filled 2015-11-06: qty 100

## 2015-11-06 MED ORDER — KCL-LACTATED RINGERS 20 MEQ/L IV SOLN
INTRAVENOUS | Status: DC
  Filled 2015-11-06 (×2): qty 1000

## 2015-11-06 MED ORDER — ENOXAPARIN SODIUM 40 MG/0.4ML ~~LOC~~ SOLN
40.0000 mg | SUBCUTANEOUS | Status: DC
  Administered 2015-11-07 – 2015-11-08 (×2): 40 mg via SUBCUTANEOUS
  Filled 2015-11-06 (×2): qty 0.4

## 2015-11-06 MED ORDER — PROPOFOL 10 MG/ML IV BOLUS
INTRAVENOUS | Status: DC | PRN
  Administered 2015-11-06: 200 mg via INTRAVENOUS
  Administered 2015-11-06: 50 mg via INTRAVENOUS

## 2015-11-06 MED ORDER — LIP MEDEX EX OINT: TOPICAL_OINTMENT | CUTANEOUS | Status: DC | PRN

## 2015-11-06 MED ORDER — KCL-LACTATED RINGERS 20 MEQ/L IV SOLN
INTRAVENOUS | Status: DC
  Administered 2015-11-06: 19:00:00 via INTRAVENOUS
  Filled 2015-11-06 (×2): qty 1000

## 2015-11-06 MED ORDER — POTASSIUM CHLORIDE 2 MEQ/ML IV SOLN
INTRAVENOUS | Status: DC
  Administered 2015-11-07 (×2): via INTRAVENOUS
  Filled 2015-11-06 (×9): qty 1000

## 2015-11-06 MED ORDER — FENTANYL CITRATE (PF) 100 MCG/2ML IJ SOLN
INTRAMUSCULAR | Status: AC
  Administered 2015-11-06: 50 ug via INTRAVENOUS
  Filled 2015-11-06: qty 2

## 2015-11-06 MED ORDER — SUFENTANIL CITRATE 50 MCG/ML IV SOLN
INTRAVENOUS | Status: DC | PRN
  Administered 2015-11-06 (×8): 10 ug via INTRAVENOUS

## 2015-11-06 MED ORDER — ONDANSETRON 4 MG PO TBDP: 4.0000 mg | ORAL_TABLET | Freq: Four times a day (QID) | ORAL | Status: DC | PRN

## 2015-11-06 MED ORDER — METOCLOPRAMIDE HCL 5 MG/ML IJ SOLN
5.0000 mg | Freq: Three times a day (TID) | INTRAMUSCULAR | Status: DC
  Administered 2015-11-06 – 2015-11-07 (×3): 5 mg via INTRAVENOUS
  Filled 2015-11-06 (×3): qty 2

## 2015-11-06 MED ORDER — LIDOCAINE HCL (CARDIAC) 20 MG/ML IV SOLN
INTRAVENOUS | Status: DC | PRN
  Administered 2015-11-06: 75 mg via INTRAVENOUS
  Administered 2015-11-06: 25 mg via INTRAVENOUS

## 2015-11-06 MED ORDER — SODIUM CHLORIDE 0.9 % IJ SOLN
INTRAMUSCULAR | Status: AC
  Filled 2015-11-06: qty 10

## 2015-11-06 MED ORDER — DEXAMETHASONE SODIUM PHOSPHATE 10 MG/ML IJ SOLN
INTRAMUSCULAR | Status: DC | PRN
  Administered 2015-11-06: 10 mg via INTRAVENOUS

## 2015-11-06 MED ORDER — HYDROMORPHONE HCL 2 MG/ML IJ SOLN
INTRAMUSCULAR | Status: AC
  Filled 2015-11-06: qty 1

## 2015-11-06 MED ORDER — CEFAZOLIN SODIUM-DEXTROSE 2-4 GM/100ML-% IV SOLN
INTRAVENOUS | Status: AC
  Filled 2015-11-06: qty 100

## 2015-11-06 MED ORDER — LIDOCAINE HCL (CARDIAC) 20 MG/ML IV SOLN
INTRAVENOUS | Status: AC
  Filled 2015-11-06: qty 5

## 2015-11-06 MED ORDER — AMLODIPINE BESYLATE 5 MG PO TABS
5.0000 mg | ORAL_TABLET | Freq: Every day | ORAL | Status: DC
  Administered 2015-11-07 – 2015-11-08 (×2): 5 mg via ORAL
  Filled 2015-11-06 (×2): qty 1

## 2015-11-06 MED ORDER — ROCURONIUM BROMIDE 100 MG/10ML IV SOLN
INTRAVENOUS | Status: DC | PRN
  Administered 2015-11-06: 20 mg via INTRAVENOUS
  Administered 2015-11-06: 60 mg via INTRAVENOUS

## 2015-11-06 MED ORDER — ATORVASTATIN CALCIUM 10 MG PO TABS: 10.0000 mg | ORAL_TABLET | Freq: Every day | ORAL | Status: DC

## 2015-11-06 MED ORDER — CHLORHEXIDINE GLUCONATE 4 % EX LIQD: 60.0000 mL | Freq: Once | CUTANEOUS | Status: DC

## 2015-11-06 MED ORDER — LACTATED RINGERS IR SOLN
Status: DC | PRN
  Administered 2015-11-06: 1000 mL

## 2015-11-06 MED ORDER — SUCCINYLCHOLINE CHLORIDE 20 MG/ML IJ SOLN
INTRAMUSCULAR | Status: DC | PRN
  Administered 2015-11-06: 140 mg via INTRAVENOUS

## 2015-11-06 MED ORDER — LACTATED RINGERS IV SOLN
INTRAVENOUS | Status: DC | PRN
  Administered 2015-11-06 (×2): via INTRAVENOUS

## 2015-11-06 MED ORDER — ONDANSETRON HCL 4 MG/2ML IJ SOLN: 4.0000 mg | Freq: Four times a day (QID) | INTRAMUSCULAR | Status: DC | PRN

## 2015-11-06 MED ORDER — HYDROMORPHONE HCL 1 MG/ML IJ SOLN
1.0000 mg | INTRAMUSCULAR | Status: DC | PRN
  Administered 2015-11-06 (×4): 1 mg via INTRAVENOUS
  Filled 2015-11-06 (×6): qty 1

## 2015-11-06 MED ORDER — SUFENTANIL CITRATE 50 MCG/ML IV SOLN
INTRAVENOUS | Status: AC
  Filled 2015-11-06: qty 1

## 2015-11-06 MED ORDER — AMLODIPINE-ATORVASTATIN 5-10 MG PO TABS: 1.0000 | ORAL_TABLET | Freq: Every day | ORAL | Status: DC

## 2015-11-06 MED ORDER — POTASSIUM CHLORIDE 2 MEQ/ML IV SOLN: INTRAVENOUS | Status: DC

## 2015-11-06 MED ORDER — 0.9 % SODIUM CHLORIDE (POUR BTL) OPTIME
TOPICAL | Status: DC | PRN
  Administered 2015-11-06: 1000 mL

## 2015-11-06 MED ORDER — LABETALOL HCL 5 MG/ML IV SOLN
INTRAVENOUS | Status: AC
  Filled 2015-11-06: qty 4

## 2015-11-06 MED ORDER — ONDANSETRON HCL 4 MG/2ML IJ SOLN: 4.0000 mg | Freq: Once | INTRAMUSCULAR | Status: DC | PRN

## 2015-11-06 MED ORDER — ONDANSETRON HCL 4 MG/2ML IJ SOLN
INTRAMUSCULAR | Status: DC | PRN
  Administered 2015-11-06: 4 mg via INTRAVENOUS

## 2015-11-06 MED ORDER — MIDAZOLAM HCL 5 MG/5ML IJ SOLN
INTRAMUSCULAR | Status: DC | PRN
  Administered 2015-11-06: 2 mg via INTRAVENOUS

## 2015-11-06 MED ORDER — SUGAMMADEX SODIUM 200 MG/2ML IV SOLN
INTRAVENOUS | Status: AC
  Filled 2015-11-06: qty 2

## 2015-11-06 MED ORDER — MIDAZOLAM HCL 2 MG/2ML IJ SOLN
INTRAMUSCULAR | Status: AC
  Filled 2015-11-06: qty 2

## 2015-11-06 MED ORDER — LABETALOL HCL 5 MG/ML IV SOLN
INTRAVENOUS | Status: DC | PRN
  Administered 2015-11-06: 5 mg via INTRAVENOUS

## 2015-11-06 MED ORDER — DEXAMETHASONE SODIUM PHOSPHATE 10 MG/ML IJ SOLN
INTRAMUSCULAR | Status: AC
  Filled 2015-11-06: qty 1

## 2015-11-06 MED ORDER — HYDROCODONE-ACETAMINOPHEN 5-325 MG PO TABS
1.0000 | ORAL_TABLET | ORAL | Status: DC | PRN
  Administered 2015-11-07 (×3): 1 via ORAL
  Administered 2015-11-08: 2 via ORAL
  Administered 2015-11-08: 1 via ORAL
  Filled 2015-11-06: qty 1
  Filled 2015-11-06 (×2): qty 2
  Filled 2015-11-06 (×3): qty 1

## 2015-11-06 MED ORDER — ACETAMINOPHEN 10 MG/ML IV SOLN
INTRAVENOUS | Status: DC | PRN
  Administered 2015-11-06: 1000 mg via INTRAVENOUS

## 2015-11-06 MED ORDER — KCL-LACTATED RINGERS 20 MEQ/L IV SOLN
INTRAVENOUS | Status: DC
  Administered 2015-11-06: 18:00:00 via INTRAVENOUS
  Filled 2015-11-06: qty 1000

## 2015-11-06 MED ORDER — BUPIVACAINE-EPINEPHRINE (PF) 0.5% -1:200000 IJ SOLN
INTRAMUSCULAR | Status: AC
  Filled 2015-11-06: qty 30

## 2015-11-06 MED ORDER — ROCURONIUM BROMIDE 100 MG/10ML IV SOLN
INTRAVENOUS | Status: AC
  Filled 2015-11-06: qty 1

## 2015-11-06 MED ORDER — PROMETHAZINE HCL 25 MG/ML IJ SOLN: 12.5000 mg | Freq: Four times a day (QID) | INTRAMUSCULAR | Status: DC | PRN

## 2015-11-06 SURGICAL SUPPLY — 57 items
APL SKNCLS STERI-STRIP NONHPOA (GAUZE/BANDAGES/DRESSINGS)
APPLIER CLIP ROT 10 11.4 M/L (STAPLE) ×3
APR CLP MED LRG 11.4X10 (STAPLE) ×1
BENZOIN TINCTURE PRP APPL 2/3 (GAUZE/BANDAGES/DRESSINGS) IMPLANT
CABLE HIGH FREQUENCY MONO STRZ (ELECTRODE) ×2 IMPLANT
CLAMP ENDO BABCK 10MM (STAPLE) ×2 IMPLANT
CLIP APPLIE ROT 10 11.4 M/L (STAPLE) IMPLANT
CLOSURE WOUND 1/2 X4 (GAUZE/BANDAGES/DRESSINGS)
COVER SURGICAL LIGHT HANDLE (MISCELLANEOUS) ×1 IMPLANT
DECANTER SPIKE VIAL GLASS SM (MISCELLANEOUS) ×3 IMPLANT
DEVICE SUT QUICK LOAD TK 5 (STAPLE) ×9 IMPLANT
DEVICE SUT TI-KNOT TK 5X26 (MISCELLANEOUS) ×1 IMPLANT
DEVICE SUTURE ENDOST 10MM (ENDOMECHANICALS) ×3 IMPLANT
DEVICE TI KNOT TK5 (MISCELLANEOUS) ×1
DISSECTOR BLUNT TIP ENDO 5MM (MISCELLANEOUS) ×3 IMPLANT
DRAIN PENROSE 18X1/2 LTX STRL (DRAIN) ×3 IMPLANT
DRAPE LAPAROSCOPIC ABDOMINAL (DRAPES) ×3 IMPLANT
ELECT PENCIL ROCKER SW 15FT (MISCELLANEOUS) IMPLANT
ELECT REM PT RETURN 9FT ADLT (ELECTROSURGICAL) ×3
ELECTRODE REM PT RTRN 9FT ADLT (ELECTROSURGICAL) ×1 IMPLANT
FELT TEFLON 4 X1 (Mesh General) ×3 IMPLANT
FILTER SMOKE EVAC LAPAROSHD (FILTER) IMPLANT
GLOVE BIO SURGEON STRL SZ 6.5 (GLOVE) ×1 IMPLANT
GLOVE BIO SURGEON STRL SZ7 (GLOVE) ×2 IMPLANT
GLOVE BIO SURGEONS STRL SZ 6.5 (GLOVE) ×1
GLOVE EUDERMIC 7 POWDERFREE (GLOVE) ×3 IMPLANT
GOWN STRL REUS W/TWL XL LVL3 (GOWN DISPOSABLE) ×10 IMPLANT
GRASPER SUT TROCAR 14GX15 (MISCELLANEOUS) ×2 IMPLANT
IRRIG SUCT STRYKERFLOW 2 WTIP (MISCELLANEOUS) ×3
IRRIGATION SUCT STRKRFLW 2 WTP (MISCELLANEOUS) ×1 IMPLANT
KIT BASIN OR (CUSTOM PROCEDURE TRAY) ×3 IMPLANT
LIQUID BAND (GAUZE/BANDAGES/DRESSINGS) IMPLANT
NS IRRIG 1000ML POUR BTL (IV SOLUTION) ×3 IMPLANT
PAD POSITIONING PINK XL (MISCELLANEOUS) ×2 IMPLANT
POSITIONER SURGICAL ARM (MISCELLANEOUS) IMPLANT
QUICK LOAD TK 5 (STAPLE) ×9
SCISSORS LAP 5X35 DISP (ENDOMECHANICALS) ×2 IMPLANT
SHEARS HARMONIC ACE PLUS 36CM (ENDOMECHANICALS) ×3 IMPLANT
SOLUTION ANTI FOG 6CC (MISCELLANEOUS) ×1 IMPLANT
STAPLER VISISTAT 35W (STAPLE) ×1 IMPLANT
STRIP CLOSURE SKIN 1/2X4 (GAUZE/BANDAGES/DRESSINGS) IMPLANT
SUT MNCRL AB 4-0 PS2 18 (SUTURE) ×3 IMPLANT
SUT SURGIDAC NAB ES-9 0 48 120 (SUTURE) ×20 IMPLANT
TAPE CLOTH 4X10 WHT NS (GAUZE/BANDAGES/DRESSINGS) IMPLANT
TIP INNERVISION DETACH 40FR (MISCELLANEOUS) IMPLANT
TIP INNERVISION DETACH 50FR (MISCELLANEOUS) ×2 IMPLANT
TIP INNERVISION DETACH 56FR (MISCELLANEOUS) IMPLANT
TIPS INNERVISION DETACH 40FR (MISCELLANEOUS)
TOWEL OR 17X26 10 PK STRL BLUE (TOWEL DISPOSABLE) ×4 IMPLANT
TRAY FOLEY W/METER SILVER 14FR (SET/KITS/TRAYS/PACK) ×3 IMPLANT
TRAY FOLEY W/METER SILVER 16FR (SET/KITS/TRAYS/PACK) ×1 IMPLANT
TRAY LAPAROSCOPIC (CUSTOM PROCEDURE TRAY) ×3 IMPLANT
TROCAR BLADELESS OPT 5 100 (ENDOMECHANICALS) ×6 IMPLANT
TROCAR XCEL BLUNT TIP 100MML (ENDOMECHANICALS) IMPLANT
TROCAR XCEL NON-BLD 11X100MML (ENDOMECHANICALS) ×3 IMPLANT
TROCAR XCEL UNIV SLVE 11M 100M (ENDOMECHANICALS) ×6 IMPLANT
TUBING INSUF HEATED (TUBING) ×3 IMPLANT

## 2015-11-06 NOTE — Op Note (Signed)
Patient Name:           Miranda Romero   Date of Surgery:        11/06/2015  Pre op Diagnosis:      Type I sliding hiatal hernia; recurrent esophageal stricture; GERD  Post op Diagnosis:    Same  Procedure:                 Laparoscopic reduction and repair of hiatal hernia, Nissen fundoplication over 50 French lighted dilator  Surgeon:                     Angelia MouldHaywood M. Derrell LollingIngram, M.D., FACS  Assistant:                      Axel FillerArmando Ramirez, M.D., FACS  Operative Indications:   . This is a 70 year old Caucasian female who  is scheduled for repair of her sliding hiatal hernia and fundoplication [facility].       She's had symptoms for some time which have been documented. [facility] which shows a 9 cm hiatal hernia but no ulceration or obstruction. About one third of the upper stomach was in the chest. She had upper endoscopy and esophageal dilatation by Dr. Dulce Sellarutlaw on June 05, 2015. This is the second time she's had to have esophageal stricture dilated.  He found a single benign-appearing esophageal stricture, somewhat tortuous esophagus and everything is looked normal. Her manometry report from March 12, 2015 shows esophageal motility normal with 7 out of 10 swallows propagating. They did note elevated LES pressure and abnormal relaxation which led to the recent endoscopy and esophageal dilatation. She takes dexilant.    She currently has minimal dysphagia, which improved following the dilatation. She has intentionally lost 10 pounds in the past 5 months. No change in her medical condition or symptoms otherwise. She is ready to go.        Comorbidities include BMI of 35, chronic cough, obstructive sleep apnea. DJD with bilateral total knee replacement. Laparoscopic appendectomy by Dr. Magnus IvanBlackman. Open cholecystectomy by Dr. Alfonzo FellerBleivernicht. History of periumbilical ventral hernia repair by me with 20 cm x 15 cm  mesh. She recovered from all of those surgeries. She does this increases her risk of adhesions. Increases risk from telemetry but nevertheless most likely left subcostal incision would work. Low but definite chance of open surgery.      She is scheduled for laparoscopic reduction and repair of hiatal hernia, possible mesh, Nissen fundoplication. She understands all of the indications details and risks of the surgery and she understands she'll have to alter her diet temporarily.  Operative Findings:       There was a sliding hiatal hernia, but we were able to reduce this completely and mobilized the intra-abdominal esophagus until we had about 6 cm of distal esophagus below the hiatus.  The right and left crura were substantial and were closed primarily with pledgeted sutures.  Mesh was not used.  Fundoplication was performed over a 50 French lighted dilator without difficulty.  The previous mesh hernia repair was intact and it looked good.  Only one trocar went through the mesh and that defect was closed with a 0 Prolene suture.  I had transient bleeding from one short gastric vessel which was controlled metal clips.  I divided a small arterial vessel traversing the gastrohepatic space.  Also controlled  with clips.  Procedure in Detail:          Following the induction of general endotracheal anesthesia a Foley catheter was placed.  The abdomen was prepped and draped in a sterile fashion.  Surgical timeout was performed.  Intravenous antibiotics were given.  0.5% Marcaine with epinephrine was used as local infiltration anesthetic.      A 5 mm optical trocar was placed in the left subcostal region.  Entry was uneventful.  There were no adhesions in this area.  There is no evidence of intestinal injury.  Pneumoperitoneum was treated.  It was one small piece of omentum stuck to the mesh up just above the umbilicus and we took that down without bleeding.  We placed 2 trochars in the upper abdomen to the  right of midline.  We placed 3 trochars in the upper abdomen to the left of midline, one for the camera.  I placed the Ironman liver retractor through an epigastric puncture wound and elevated the liver.  After assessing the anatomy we took down the gastrohepatic ligament.  We identified the right crus and mobilized it away from the esophagus.  There was one small arterial vessel traversing the field and this had to be isolated, controlled with 6 metal clips and divided.  We were then able to see very well and completely mobilize the esophagus and stomach away from the right crus.  We took the dissection up anteriorly across the apex of the crura.  We then mobilized the upper part of the left crus.  We took down the short gastrics with the harmonic scalpel.  We mobilized the fundus extensively.  We created a window behind the EG junction and anterior to the crura and passed a Penrose drain and with lifting this up we took the rest of the attachments down.  At the completion of this dissection we could see the right and left crura completely.  We found that we could pass the fundus behind the esophagus without difficulty.  We passed a 50 French lighted dilator without difficulty.  We closed the diaphragm with 3 pledgeted sutures of 0 Surgidek.  We then passed a suitable piece of fundus behind the esophagus and lifted that up on the right side.  We then lifted the fundus up on the left side and made sure that these areas were contiguous.  The fundoplication was performed with 3 interrupted sutures of 0 Surgidek taking a suitable piece of fundus of the left, anterior bite of the esophagus, and suitable bite of the fundus on the right.  These were tied with Ti-Knot  devices.  We removed the dilator.  We tacked the upper end of the fundoplication to the diaphragm on the right and on the left with another suture on each side to reduce the chance of slippage.  We irrigated the field and everything looked good.  Hemostasis  excellent.  I removed the one trocar that had to be passed to the mesh and then using a Endo Close device closed this defect in the mesh with a 0 Prolene suture.  The pneumoperitoneum was released.  The trochars were removed.  His trocar sites were closed with skin staples and 2 x 2 gauze.  Patient tolerated she did well was taken to PACU in stable condition.  EBL 40 mL.  Counts correct.  Complications none.     Angelia MouldHaywood M. Derrell LollingIngram, M.D., FACS General and Minimally Invasive Surgery Breast and Colorectal Surgery  11/06/2015 10:35 AM

## 2015-11-06 NOTE — Anesthesia Postprocedure Evaluation (Signed)
Anesthesia Post Note  Patient: Miranda Romero  Procedure(s) Performed: Procedure(s) (LRB): LAPAROSCOPIC REPAIR HIATAL HERNIA, Nissen FUNDOPLICATION (N/A)  Patient location during evaluation: PACU Anesthesia Type: General Level of consciousness: awake and alert Pain management: pain level controlled Vital Signs Assessment: post-procedure vital signs reviewed and stable Respiratory status: spontaneous breathing, nonlabored ventilation, respiratory function stable and patient connected to nasal cannula oxygen Cardiovascular status: blood pressure returned to baseline and stable Postop Assessment: no signs of nausea or vomiting Anesthetic complications: no    Last Vitals:  Vitals:   11/06/15 1145 11/06/15 1230  BP: 131/77 101/62  Pulse: 91 87  Resp: 14 13  Temp:      Last Pain:  Vitals:   11/06/15 1230  TempSrc:   PainSc: 0-No pain                 Bradlee Heitman JENNETTE

## 2015-11-06 NOTE — Anesthesia Preprocedure Evaluation (Signed)
Anesthesia Evaluation  Patient identified by MRN, date of birth, ID band Patient awake    Reviewed: Allergy & Precautions, NPO status , Patient's Chart, lab work & pertinent test results  History of Anesthesia Complications Negative for: history of anesthetic complications  Airway Mallampati: II  TM Distance: >3 FB Neck ROM: Full    Dental no notable dental hx. (+) Dental Advisory Given   Pulmonary sleep apnea ,    Pulmonary exam normal breath sounds clear to auscultation       Cardiovascular hypertension, Normal cardiovascular exam Rhythm:Regular Rate:Normal     Neuro/Psych negative neurological ROS  negative psych ROS   GI/Hepatic Neg liver ROS, hiatal hernia, GERD  ,  Endo/Other  obesity  Renal/GU negative Renal ROS  negative genitourinary   Musculoskeletal  (+) Arthritis , Fibromyalgia -  Abdominal   Peds negative pediatric ROS (+)  Hematology negative hematology ROS (+)   Anesthesia Other Findings   Reproductive/Obstetrics negative OB ROS                             Anesthesia Physical Anesthesia Plan  ASA: II  Anesthesia Plan: General   Post-op Pain Management:    Induction: Intravenous  Airway Management Planned: Video Laryngoscope Planned and Oral ETT  Additional Equipment:   Intra-op Plan:   Post-operative Plan: Extubation in OR  Informed Consent: I have reviewed the patients History and Physical, chart, labs and discussed the procedure including the risks, benefits and alternatives for the proposed anesthesia with the patient or authorized representative who has indicated his/her understanding and acceptance.   Dental advisory given  Plan Discussed with: CRNA  Anesthesia Plan Comments:         Anesthesia Quick Evaluation

## 2015-11-06 NOTE — Interval H&P Note (Signed)
History and Physical Interval Note:  11/06/2015 7:29 AM  Miranda Romero  has presented today for surgery, with the diagnosis of hiatal henia with gerd stricture  The various methods of treatment have been discussed with the patient and family. After consideration of risks, benefits and other options for treatment, the patient has consented to  Procedure(s): LAPAROSCOPIC REPAIR HIATAL HERNIA, FUNDOPLICATION (N/A) as a surgical intervention .  The patient's history has been reviewed, patient examined, no change in status, stable for surgery.  I have reviewed the patient's chart and labs.  Questions were answered to the patient's satisfaction.     Ernestene MentionINGRAM,Tayvon Culley M

## 2015-11-06 NOTE — Anesthesia Procedure Notes (Addendum)
Procedure Name: Intubation Date/Time: 11/06/2015 8:26 AM Performed by: Illene SilverEVANS, Joshua Soulier E Pre-anesthesia Checklist: Patient identified, Emergency Drugs available, Suction available and Patient being monitored Patient Re-evaluated:Patient Re-evaluated prior to inductionOxygen Delivery Method: Circle system utilized Preoxygenation: Pre-oxygenation with 100% oxygen Intubation Type: IV induction Ventilation: Mask ventilation without difficulty Laryngoscope Size: Mac, Glidescope and 4 Grade View: Grade III Tube type: Oral Tube size: 7.5 mm Number of attempts: 1 Airway Equipment and Method: Stylet and Oral airway Placement Confirmation: ETT inserted through vocal cords under direct vision,  positive ETCO2 and breath sounds checked- equal and bilateral Secured at: 22 cm Tube secured with: Tape Dental Injury: Teeth and Oropharynx as per pre-operative assessment  Difficulty Due To: Difficulty was anticipated, Difficult Airway- due to anterior larynx, Difficult Airway- due to limited oral opening and Difficult Airway- due to dentition Comments: Elective  glidescope secondary to  Hx of anterior larynx ,small mouth , slightly protruding teeth . Easy to visualize with glidescope

## 2015-11-06 NOTE — Transfer of Care (Signed)
Immediate Anesthesia Transfer of Care Note  Patient: Miranda Romero  Procedure(s) Performed: Procedure(s): LAPAROSCOPIC REPAIR HIATAL HERNIA, Nissen FUNDOPLICATION (N/A)  Patient Location: PACU  Anesthesia Type:General  Level of Consciousness: awake, alert , oriented and patient cooperative  Airway & Oxygen Therapy: Patient Spontanous Breathing and Patient connected to face mask oxygen  Post-op Assessment: Report given to RN, Post -op Vital signs reviewed and stable and Patient moving all extremities X 4  Post vital signs: stable  Last Vitals:  Vitals:   11/06/15 1043 11/06/15 1045  BP: 122/69 (P) 122/65  Pulse: 96 95  Resp: (!) 8 13  Temp: 36.4 C     Last Pain:  Vitals:   11/06/15 0719  TempSrc:   PainSc: 4       Patients Stated Pain Goal: 4 (11/06/15 0719)  Complications: No apparent anesthesia complications

## 2015-11-07 ENCOUNTER — Ambulatory Visit (HOSPITAL_COMMUNITY): Payer: Medicare Other

## 2015-11-07 DIAGNOSIS — K449 Diaphragmatic hernia without obstruction or gangrene: Secondary | ICD-10-CM | POA: Diagnosis not present

## 2015-11-07 LAB — CBC
HEMATOCRIT: 35.4 % — AB (ref 36.0–46.0)
HEMOGLOBIN: 11.6 g/dL — AB (ref 12.0–15.0)
MCH: 31.2 pg (ref 26.0–34.0)
MCHC: 32.8 g/dL (ref 30.0–36.0)
MCV: 95.2 fL (ref 78.0–100.0)
Platelets: 328 10*3/uL (ref 150–400)
RBC: 3.72 MIL/uL — ABNORMAL LOW (ref 3.87–5.11)
RDW: 14.3 % (ref 11.5–15.5)
WBC: 19.1 10*3/uL — ABNORMAL HIGH (ref 4.0–10.5)

## 2015-11-07 LAB — BASIC METABOLIC PANEL
ANION GAP: 7 (ref 5–15)
BUN: 19 mg/dL (ref 6–20)
CALCIUM: 8.7 mg/dL — AB (ref 8.9–10.3)
CO2: 30 mmol/L (ref 22–32)
Chloride: 104 mmol/L (ref 101–111)
Creatinine, Ser: 0.89 mg/dL (ref 0.44–1.00)
GFR calc Af Amer: >60 mL/min (ref >60)
GFR calc non Af Amer: >60 mL/min (ref >60)
GLUCOSE: 134 mg/dL — AB (ref 65–99)
POTASSIUM: 4.6 mmol/L (ref 3.5–5.1)
Sodium: 141 mmol/L (ref 135–145)

## 2015-11-07 MED ORDER — IOPAMIDOL (ISOVUE-300) INJECTION 61%: 150.0000 mL | Freq: Once | INTRAVENOUS | Status: DC | PRN

## 2015-11-07 NOTE — Progress Notes (Signed)
1 Day Post-Op  Subjective: Alert and oriented.  Mental status normal.  Denies nausea.  Pain is 2-3.  Denies shortness of breath. Overall looks good.  She is concerned because she had a transient episode of confusion last night.  This has now resolved. Voiding  in bedside commode.  BP 134/79.  Heart rate variable 85-112.  Respiratory rate 16.  SPO2 96% on 2 L. WBC 17,200.  Hemoglobin 12.4.  Creatinine 0.79.  Objective: Vital signs in last 24 hours: Temp:  [97.5 F (36.4 C)-99.2 F (37.3 C)] 99.2 F (37.3 C) (08/24 0510) Pulse Rate:  [85-114] 112 (08/24 0510) Resp:  [8-23] 16 (08/24 0510) BP: (96-155)/(58-94) 134/79 (08/24 0510) SpO2:  [83 %-99 %] 96 % (08/24 0510) Weight:  [98 kg (216 lb)] 98 kg (216 lb) (08/23 0702) Last BM Date: 11/06/15  Intake/Output from previous day: 08/23 0701 - 08/24 0700 In: 3577.1 [I.V.:3577.1] Out: 906 [Urine:831; Blood:75] Intake/Output this shift: Total I/O In: 1077.1 [I.V.:1077.1] Out: 500 [Urine:500]  General appearance: Alert.  Oriented.  Mental status normal.  No distress.  Skin warm and dry. Chest wall: no tenderness, Clear to auscultation bilaterally.  No rhonchi or wheeze.  Breath sounds equal. GI: Soft.  Nondistended.  Wounds look good.  Hypoactive bowel sounds.  Appropriate incisional tenderness.  Lab Results:  Results for orders placed or performed during the hospital encounter of 11/06/15 (from the past 24 hour(s))  CBC     Status: Abnormal   Collection Time: 11/06/15  4:03 PM  Result Value Ref Range   WBC 17.2 (H) 4.0 - 10.5 K/uL   RBC 4.02 3.87 - 5.11 MIL/uL   Hemoglobin 12.4 12.0 - 15.0 g/dL   HCT 65.736.8 84.636.0 - 96.246.0 %   MCV 91.5 78.0 - 100.0 fL   MCH 30.8 26.0 - 34.0 pg   MCHC 33.7 30.0 - 36.0 g/dL   RDW 95.213.6 84.111.5 - 32.415.5 %   Platelets 324 150 - 400 K/uL  Creatinine, serum     Status: None   Collection Time: 11/06/15  4:03 PM  Result Value Ref Range   Creatinine, Ser 0.79 0.44 - 1.00 mg/dL   GFR calc non Af Amer >60 >60  mL/min   GFR calc Af Amer >60 >60 mL/min     Studies/Results: No results found.  Marland Kitchen. amLODipine  5 mg Oral Daily  . enoxaparin (LOVENOX) injection  40 mg Subcutaneous Q24H  . famotidine (PEPCID) IV  20 mg Intravenous Q12H  . Tavaborole  1 application Apply externally Daily     Assessment/Plan: s/p Procedure(s): LAPAROSCOPIC REPAIR HIATAL HERNIA, Nissen FUNDOPLICATION  POD #1.  Laparoscopic repair of hiatal hernia with Nissen fundoplication. Looks stable Leukocytosis and mild tachycardia unexplained and does not correlate with otherwise stable clinical course.  Proceed with water-soluble upper GI this morning.  If this looks good we'll start clear liquid diet  Transient confusion.  Suspect anesthesia related with possible mild hypoxia.  This has now resolved. Discontinue Reglan Discontinue Phenergan  @PROBHOSP @  LOS: 0 days    Miranda Romero M 11/07/2015  . .prob

## 2015-11-08 DIAGNOSIS — K449 Diaphragmatic hernia without obstruction or gangrene: Secondary | ICD-10-CM | POA: Diagnosis not present

## 2015-11-08 LAB — CBC
HCT: 35.4 % — ABNORMAL LOW (ref 36.0–46.0)
Hemoglobin: 11.4 g/dL — ABNORMAL LOW (ref 12.0–15.0)
MCH: 30.9 pg (ref 26.0–34.0)
MCHC: 32.2 g/dL (ref 30.0–36.0)
MCV: 95.9 fL (ref 78.0–100.0)
PLATELETS: 290 10*3/uL (ref 150–400)
RBC: 3.69 MIL/uL — ABNORMAL LOW (ref 3.87–5.11)
RDW: 14.5 % (ref 11.5–15.5)
WBC: 11.8 10*3/uL — AB (ref 4.0–10.5)

## 2015-11-08 LAB — COMPREHENSIVE METABOLIC PANEL
ALBUMIN: 3.5 g/dL (ref 3.5–5.0)
ALT: 231 U/L — ABNORMAL HIGH (ref 14–54)
ANION GAP: 6 (ref 5–15)
AST: 168 U/L — AB (ref 15–41)
Alkaline Phosphatase: 57 U/L (ref 38–126)
BUN: 11 mg/dL (ref 6–20)
CHLORIDE: 104 mmol/L (ref 101–111)
CO2: 30 mmol/L (ref 22–32)
Calcium: 8.6 mg/dL — ABNORMAL LOW (ref 8.9–10.3)
Creatinine, Ser: 0.65 mg/dL (ref 0.44–1.00)
GFR calc Af Amer: >60 mL/min (ref >60)
Glucose, Bld: 103 mg/dL — ABNORMAL HIGH (ref 65–99)
POTASSIUM: 4 mmol/L (ref 3.5–5.1)
Sodium: 140 mmol/L (ref 135–145)
Total Bilirubin: 0.9 mg/dL (ref 0.3–1.2)
Total Protein: 6.9 g/dL (ref 6.5–8.1)

## 2015-11-08 LAB — LIPASE, BLOOD: LIPASE: 22 U/L (ref 11–51)

## 2015-11-08 MED ORDER — SENNOSIDES-DOCUSATE SODIUM 8.6-50 MG PO TABS
2.0000 | ORAL_TABLET | Freq: Two times a day (BID) | ORAL | Status: DC
  Administered 2015-11-08: 2 via ORAL
  Filled 2015-11-08: qty 2

## 2015-11-08 MED ORDER — HYDROCODONE-ACETAMINOPHEN 5-325 MG PO TABS: 1.0000 | ORAL_TABLET | ORAL | 0 refills | Status: DC | PRN

## 2015-11-08 NOTE — Discharge Instructions (Signed)
-  see above 

## 2015-11-08 NOTE — Progress Notes (Signed)
Discharge instructions discussed with patient and family, verbalized understanding and agreement, prescriptions given to patient

## 2015-11-08 NOTE — Progress Notes (Signed)
2 Days Post-Op  Subjective: Feels good and wants to go home. Tolerating clear liquids without dysphagia, nausea, or bloating. Pain control adequate.  Has incisional pain Using incentive spirometer. Passing flatus but has not had a bowel movement Says she ambulated in the halls 20 times.  Afebrile.  Normotensive.  Heart rate 80.  SPO2 98% on 2 L.  Everything looks good except WBC 19,000 yesterday.  Hemoglobin stable at 11.6.  This is not explained.  I informed her of this. No well problems during surgery.  A couple of bleeders, one short gastric and one small bridging vessel and gastrohepatic ligament were divided.  Don't think she is bleeding. No clinical or radiographic evidence of leak or obstruction.    Objective: Vital signs in last 24 hours: Temp:  [98.6 F (37 C)-99.2 F (37.3 C)] 99.2 F (37.3 C) (08/25 0619) Pulse Rate:  [80-101] 80 (08/25 0619) Resp:  [16-18] 18 (08/25 0619) BP: (113-134)/(64-74) 134/74 (08/25 0619) SpO2:  [96 %-98 %] 98 % (08/25 0619) Last BM Date: 11/06/15  Intake/Output from previous day: 08/24 0701 - 08/25 0700 In: 2660 [P.O.:1310; I.V.:1300; IV Piggyback:50] Out: 4025 [Urine:4025] Intake/Output this shift: Total I/O In: 890 [P.O.:890] Out: 3000 [Urine:3000]  General appearance: Alert.  Oriented.  Mental status normal.  No distress. Resp: clear to auscultation bilaterally GI: Soft.  Not distended.  Active bowel sounds.  Wounds look clean.  Appropriate incisional tenderness, mild.  Lab Results:  Results for orders placed or performed during the hospital encounter of 11/06/15 (from the past 24 hour(s))  Basic metabolic panel     Status: Abnormal   Collection Time: 11/07/15  7:42 AM  Result Value Ref Range   Sodium 141 135 - 145 mmol/L   Potassium 4.6 3.5 - 5.1 mmol/L   Chloride 104 101 - 111 mmol/L   CO2 30 22 - 32 mmol/L   Glucose, Bld 134 (H) 65 - 99 mg/dL   BUN 19 6 - 20 mg/dL   Creatinine, Ser 4.09 0.44 - 1.00 mg/dL   Calcium 8.7  (L) 8.9 - 10.3 mg/dL   GFR calc non Af Amer >60 >60 mL/min   GFR calc Af Amer >60 >60 mL/min   Anion gap 7 5 - 15  CBC     Status: Abnormal   Collection Time: 11/07/15  7:42 AM  Result Value Ref Range   WBC 19.1 (H) 4.0 - 10.5 K/uL   RBC 3.72 (L) 3.87 - 5.11 MIL/uL   Hemoglobin 11.6 (L) 12.0 - 15.0 g/dL   HCT 81.1 (L) 91.4 - 78.2 %   MCV 95.2 78.0 - 100.0 fL   MCH 31.2 26.0 - 34.0 pg   MCHC 32.8 30.0 - 36.0 g/dL   RDW 95.6 21.3 - 08.6 %   Platelets 328 150 - 400 K/uL     Studies/Results: Dg Esophagus W/water Sol Cm  Result Date: 11/07/2015 CLINICAL DATA:  Patient status post Nissen fundoplication. Evaluate for leak. EXAM: ESOPHOGRAM/BARIUM SWALLOW TECHNIQUE: Single contrast examination was performed using water-soluble contrast. FLUOROSCOPY TIME:  Fluoroscopy Time:  2 minutes, 42 seconds COMPARISON:  Chest radiograph 11/04/2015 FINDINGS: Initial KUB demonstrates postsurgical changes within the left upper quadrant. Stool throughout the colon. Contrast flows freely throughout the distal esophagus and into the stomach. No evidence for frank leak. Narrowing of the distal esophagus compatible with recent Nissen fundoplication. IMPRESSION: No evidence for leak. Electronically Signed   By: Annia Belt M.D.   On: 11/07/2015 10:06    . amLODipine  5 mg Oral Daily  . enoxaparin (LOVENOX) injection  40 mg Subcutaneous Q24H  . famotidine (PEPCID) IV  20 mg Intravenous Q12H  . senna-docusate  2 tablet Oral BID  . Tavaborole  1 application Apply externally Daily     Assessment/Plan: s/p Procedure(s): LAPAROSCOPIC REPAIR HIATAL HERNIA, Nissen FUNDOPLICATION  POD #2.  Laparoscopic repair of hiatal hernia with Nissen fundoplication. Looks stable Clinically and radiographically she is making good progress Advance to full liquid diet Decrease IV TKO since diuresing  Leukocytosis - clinical significance of this is unclear.  Mild tachycardia has resolved and clinically otherwise doing well.      Check CBC, cmet, and lipase     Reassess later this morning    @PROBHOSP @  LOS: 0 days    Jakeisha Stricker M 11/08/2015  . .prob

## 2015-11-08 NOTE — Discharge Summary (Signed)
Patient ID: Miranda Romero [id number] 70 y.o. 11/03/1945  Admit date: 11/06/2015  Discharge date and time: 11/08/2015  Admitting Physician: Ernestene Mention  Discharge Physician: Ernestene Mention  Admission Diagnoses: hiatal henia with gerd stricture  Discharge Diagnoses: Type I sliding hiatal hernia with GERD                                          Esophageal stricture                                         Obstructive sleep apnea                                          Chronic cough                                          BMI 38                                          Fibromyalgia                                          History incisional hernia repair with mesh                                          History open cholecystectomy                                          History laparoscopic appendectomy                                          History bilateral total knee replacement  Operations: Procedure(s): LAPAROSCOPIC REPAIR HIATAL HERNIA, Nissen FUNDOPLICATION  Admission Condition: good  Discharged Condition: good  Indication for Admission: This is a 70 year old Caucasian female who  is scheduled for repair of her sliding hiatal hernia and fundoplication in about 4 weeks at [facility].       She's had symptoms for some time which have been documented. She had an upper GI  At [facility] on February 13, 2015 which shows a 9 cm hiatal hernia but no ulceration or obstruction. About one third of the upper stomach was in the chest. She had upper endoscopy and esophageal dilatation by Dr. Dulce Sellar on June 05, 2015. This is the second time she's had to have esophageal stricture dilated.  He found a single benign-appearing esophageal stricture, somewhat tortuous esophagus and everything is looked normal. Her manometry report from March 12, 2015 shows esophageal motility normal with 7 out of 10 swallows  propagating. They did note elevated LES pressure and  abnormal relaxation which led to the recent endoscopy and esophageal dilatation. She takes dexilant.    She currently has minimal dysphagia, which improved following the dilatation. She has intentionally lost 10 pounds in the past 5 months. No change in her medical condition or symptoms otherwise. She is ready to go.        Comorbidities include BMI of 35, chronic cough, obstructive sleep apnea. DJD with bilateral total knee replacement. Laparoscopic appendectomy by Dr. Magnus Ivan. Open cholecystectomy by Dr. Alfonzo Feller. History of periumbilical ventral hernia repair by me with 20 cm x 15 cm mesh. She recovered from all of those surgeries. She does this increases her risk of adhesions. Increases risk from telemetry but nevertheless most likely left subcostal incision would work. Low but definite chance of open surgery.      She is scheduled for laparoscopic reduction and repair of hiatal hernia, possible mesh, Nissen fundoplication. She understands all of the indications details and risks of the surgery and she understands she'll have to alter her diet temporarily.  Hospital Course: On the day of admission the patient was taken to the operating room and underwent reduction and repair of type I sliding hiatal hernia and Nissen fundoplication.      Operative findings were There was a sliding hiatal hernia, but we were able to reduce this completely and mobilized the intra-abdominal esophagus until we had about 6 cm of distal esophagus below the hiatus.  The right and left crura were substantial and were closed primarily with pledgeted sutures.  Mesh was not used.  Fundoplication was performed over a 50 French lighted dilator without difficulty.  The previous mesh ventral hernia repair was intact and it looked good.  Only one trocar went through the mesh and that defect was closed with a 0 Prolene suture.  I had transient bleeding from one short gastric vessel which was controlled metal clips.  I  divided a small arterial vessel traversing the gastrohepatic space.  Also controlled with clips.      Water-soluble esophageal swallow study on postop day 1 looked good.  Fundoplication in place.  No obstruction.  No leak.  She was started on a clear liquid diet on postop day 1 and tolerated that very well and was advanced to full liquid diet on postop day 1 and tolerated that very well.  On postop day #2 she felt well.  She was ambulating in the halls.  Had no nausea.  Minimal pain.  Had had a bowel movement.  Examination of the time of discharge revealed that her lungs were clear.  Abdomen was soft and not distended.  Wounds looked good with minimal tenderness.  Active bowel sounds.      She had postop leukocytosis up to 19,000 but on the day of discharge it was down to 11,000.  Mild elevation of transaminases thought to be related to liver retractor.  Lipase normal.  No sign of bleeding.  She was made aware of these lab values.       She was given a prescription for Norco for pain.  She was told how to gradually get back on all of her medications.  I told her to stay on her DEXA lab for now but hopefully we can wean that off later on.  She was given routine post surgical instructions and specifically was given instructions on how to advance her diet following esophageal surgery.       She will  return to the office in one week for staple removal.  Plan barium swallow in about 6 weeks.  Consults: None  Significant Diagnostic Studies: Water-soluble upper GI  Treatments: surgery: Laparoscopic reduction and repair of hiatal hernia and Nissen fundoplication  Disposition: Home  Patient Instructions:    Medication List    TAKE these medications   amLODipine-atorvastatin 5-10 MG tablet Commonly known as:  CADUET Take 1 tablet by mouth daily.   aspirin 81 MG chewable tablet Chew 81 mg by mouth daily.   CALCIUM PO Take 1,500 mg by mouth daily.   Co Q-10 200 MG Caps Take 200 mg by mouth daily.    DEXILANT 60 MG capsule Generic drug:  dexlansoprazole Take 60 mg by mouth daily.   diclofenac sodium 1 % Gel Commonly known as:  VOLTAREN Apply 2 g topically daily as needed (for pain).   DULoxetine 30 MG capsule Commonly known as:  CYMBALTA Take 30 mg by mouth at bedtime.   fexofenadine 180 MG tablet Commonly known as:  ALLEGRA Take 180 mg by mouth daily with breakfast.   gabapentin 300 MG capsule Commonly known as:  NEURONTIN Take 300 mg by mouth 2 (two) times daily.   HYDROcodone-acetaminophen 5-325 MG tablet Commonly known as:  NORCO/VICODIN Take 1-2 tablets by mouth every 4 (four) hours as needed for moderate pain.   IRON PO Take 1 tablet by mouth daily.   KERYDIN 5 % Soln Generic drug:  Tavaborole Apply 1 application topically daily. Apply to toes   MAGNESIUM PO Take 1,000 mg by mouth 2 (two) times daily.   Manganese 50 MG Tabs Take 50 mg by mouth daily.   thiamine 100 MG tablet Commonly known as:  VITAMIN B-1 Take 100 mg by mouth daily with breakfast.   traMADol 50 MG tablet Commonly known as:  ULTRAM Take 100 mg by mouth daily as needed (pain). For pain   VITAMIN D-400 400 units Tabs tablet Generic drug:  cholecalciferol Take 400 Units by mouth daily with breakfast.       Activity: Unlimited ambulation.  No sports or heavy lifting for 3 weeks.  Driving in 3-6 days. Diet: Postop esophageal surgery diet as outlined Wound Care: as directed  Follow-up:  With Dr. Derrell LollingIngram in 1 week.  Signed: Angelia MouldHaywood M. Derrell LollingIngram, M.D., FACS General and minimally invasive surgery Breast and Colorectal Surgery  11/08/2015, 10:26 AM

## 2015-12-05 ENCOUNTER — Other Ambulatory Visit (HOSPITAL_COMMUNITY): Payer: Self-pay | Admitting: General Surgery

## 2015-12-05 DIAGNOSIS — K449 Diaphragmatic hernia without obstruction or gangrene: Secondary | ICD-10-CM

## 2015-12-10 ENCOUNTER — Ambulatory Visit (HOSPITAL_COMMUNITY): Payer: Medicare Other

## 2016-01-01 ENCOUNTER — Ambulatory Visit (HOSPITAL_COMMUNITY)
Admission: RE | Admit: 2016-01-01 | Discharge: 2016-01-01 | Disposition: A | Discharge status: Home / Self Care | Payer: Medicare Other | Source: Ambulatory Visit | Attending: General Surgery | Admitting: General Surgery

## 2016-01-01 ENCOUNTER — Other Ambulatory Visit (HOSPITAL_COMMUNITY): Payer: Self-pay | Admitting: General Surgery

## 2016-01-01 DIAGNOSIS — K224 Dyskinesia of esophagus: Secondary | ICD-10-CM | POA: Insufficient documentation

## 2016-01-01 DIAGNOSIS — K449 Diaphragmatic hernia without obstruction or gangrene: Secondary | ICD-10-CM | POA: Diagnosis present

## 2016-02-03 DIAGNOSIS — M7062 Trochanteric bursitis, left hip: Secondary | ICD-10-CM | POA: Insufficient documentation

## 2016-04-17 ENCOUNTER — Ambulatory Visit (INDEPENDENT_AMBULATORY_CARE_PROVIDER_SITE_OTHER): Payer: Medicare Other | Admitting: Nurse Practitioner

## 2016-04-17 ENCOUNTER — Encounter: Payer: Self-pay | Admitting: Nurse Practitioner

## 2016-04-17 VITALS — BP 108/78 | HR 80 | Resp 16 | Ht 65.5 in | Wt 207.4 lb

## 2016-04-17 DIAGNOSIS — Z01411 Encounter for gynecological examination (general) (routine) with abnormal findings: Secondary | ICD-10-CM | POA: Diagnosis not present

## 2016-04-17 DIAGNOSIS — Z0000 Encounter for general adult medical examination without abnormal findings: Secondary | ICD-10-CM | POA: Diagnosis not present

## 2016-04-17 DIAGNOSIS — Z01419 Encounter for gynecological examination (general) (routine) without abnormal findings: Secondary | ICD-10-CM

## 2016-04-17 NOTE — Patient Instructions (Addendum)

## 2016-04-17 NOTE — Progress Notes (Signed)
Encounter reviewed by Dr. Brook Amundson C. Silva.  

## 2016-04-17 NOTE — Progress Notes (Signed)
Patient ID: Miranda Romero, female   DOB: 1945-12-05, 71 y.o.   MRN: [id number]  71 y.o. [id number] Married  Caucasian Fe here for annual exam.  [place] is now back home from [place] for [place] months.  She did have hiatal hernia repair 11/06/15 from Dr. Derrell LollingIngram and has done well.  Has been off Celebrex due to elevated liver test.  She is now back on med's since liver enzymes are normal.  She was having a lot of generalized pain from OA but also flares of fibromyalgia.    She is having some overactive bladder symptoms with nocturia X 4.  She would like to start with a "mild medication'.  She is having some digestive issues since hernia surgery with constipation and does not want this to get worse. She and husband are renewing their vows and having a 6650 th wedding anniversary party at the [employer] [place] in March.  Patient's last menstrual period was 09/20/2000 (exact date).          Sexually active: Yes.    The current method of family planning is post menopausal status.    Exercising: Yes.    Walking Smoker:  no  Health Maintenance: Pap:  06/22/11, Negative  MMG: 04/17/15, 3D, Bi-Rads 2: Benign Colonoscopy:  12/19/04, repeat 10 years (declines anymore) IFOB done at PCP BMD: 04/09/14 T Score, -0.4 Spine / -1.5 Right Femur Neck / -0.3 Left Femur Neck (had last week-Dr. Wylene Simmerisovec) TDaP: per PCP - 2016 Shingles: does not apply - but considering the newest vaccine Pneumonia: 04/16/08, 11/29/07 Pneumovax Hep C: 04/16/15 Labs: PCP takes care of all labs   reports that she has never smoked. She has never used smokeless tobacco. She reports that she drinks alcohol. She reports that she does not use drugs.  Past Medical History:  Diagnosis Date  . Arthritis   . Blood transfusion   . Fibromyalgia    Dr Corliss Skainseveshwar  . GERD (gastroesophageal reflux disease)   . Hiatal hernia with gastroesophageal reflux 11/06/2015  . History of hiatal hernia   . Hyperlipidemia   . Hypertension    Dr Wylene Simmerisovec LOV 4/13 on chart  . Seasonal  allergies   . Sleep apnea    SEVERE  per sleep study 3/12 EPIC no CPAP   . Transfusion history    3 yrs ago s/p surgery for knee  . Umbilical hernia   . Weight decrease     Past Surgical History:  Procedure Laterality Date  . ANKLE SURGERY    . APPENDECTOMY  2014  . BALLOON DILATION N/A 06/05/2015   Procedure: BALLOON DILATION;  Surgeon: Willis ModenaWilliam Outlaw, MD;  Location: [facility];  Service: Endoscopy;  Laterality: N/A;  . CATARACT EXTRACTION Bilateral 05/2013  . CHOLECYSTECTOMY    . DILATION AND CURETTAGE OF UTERUS    . ESOPHAGOGASTRODUODENOSCOPY (EGD) WITH PROPOFOL N/A 06/05/2015   Procedure: ESOPHAGOGASTRODUODENOSCOPY (EGD) WITH PROPOFOL;  Surgeon: Willis ModenaWilliam Outlaw, MD;  Location: [facility];  Service: Endoscopy;  Laterality: N/A;  . HERNIA REPAIR  05/2013   due to appendectomy with mesh  . HIATAL HERNIA REPAIR N/A 11/06/2015   Procedure: LAPAROSCOPIC REPAIR HIATAL HERNIA, Nissen FUNDOPLICATION;  Surgeon: Claud KelpHaywood Ingram, MD;  Location: [facility];  Service: General;  Laterality: N/A;  . JOINT REPLACEMENT    . KNEE ARTHROPLASTY     bilateral  . knee infection     right knee hardware removed 11/12/   replaced 1/13  . VENTRAL HERNIA REPAIR  07/21/2011   Procedure: LAPAROSCOPIC VENTRAL  HERNIA;  Surgeon: Ernestene Mention, MD;  Location: [facility];  Service: General;  Laterality: N/A;  Laparoscopic Repair Verntal Incisional Hernia and Mesh    Current Outpatient Prescriptions  Medication Sig Dispense Refill  . amLODipine-atorvastatin (CADUET) 5-10 MG tablet Take 1 tablet by mouth daily.    Marland Kitchen aspirin 81 MG chewable tablet Chew 81 mg by mouth daily.    Marland Kitchen CALCIUM PO Take 1,500 mg by mouth daily.    . celecoxib (CELEBREX) 200 MG capsule Take by mouth.    . cholecalciferol (VITAMIN D-400) 400 UNITS TABS Take 400 Units by mouth daily with breakfast.     . Coenzyme Q10 (CO Q-10) 200 MG CAPS Take 200 mg by mouth daily.    . diclofenac sodium (VOLTAREN) 1 % GEL Apply 2 g topically daily as needed (for  pain).    . DULoxetine (CYMBALTA) 30 MG capsule Take 30 mg by mouth at bedtime.     . fexofenadine (ALLEGRA) 180 MG tablet Take 180 mg by mouth daily with breakfast.     . gabapentin (NEURONTIN) 300 MG capsule Take 300 mg by mouth 2 (two) times daily.     . IRON PO Take 1 tablet by mouth daily.    Marland Kitchen MAGNESIUM PO Take 1,000 mg by mouth 2 (two) times daily.    . Manganese 50 MG TABS Take 50 mg by mouth daily.    Laurann Montana (KERYDIN) 5 % SOLN Apply 1 application topically daily. Apply to toes    . Thiamine HCl (VITAMIN B-1) 100 MG tablet Take 100 mg by mouth daily with breakfast.     . traMADol (ULTRAM) 50 MG tablet Take 100 mg by mouth daily as needed (pain). For pain     No current facility-administered medications for this visit.     Family History  Problem Relation Age of Onset  . Lymphoma Mother     ROS:  Pertinent items are noted in HPI.  Otherwise, a comprehensive ROS was negative.  Exam:   BP 108/78 (BP Location: Right Arm, Patient Position: Sitting, Cuff Size: Normal)   Pulse 80   Resp 16   Ht 5' 5.5" (1.664 m)   Wt 207 lb 6.4 oz (94.1 kg)   LMP 09/20/2000 (Exact Date)   BMI 33.99 kg/m  Height: 5' 5.5" (166.4 cm) Ht Readings from Last 3 Encounters:  04/17/16 5' 5.5" (1.664 m)  11/06/15 5' 5.5" (1.664 m)  11/04/15 5' 5.5" (1.664 m)    General appearance: alert, cooperative and appears stated age Head: Normocephalic, without obvious abnormality, atraumatic Neck: no adenopathy, supple, symmetrical, trachea midline and thyroid normal to inspection and palpation Lungs: clear to auscultation bilaterally Breasts: normal appearance, no masses or tenderness Heart: regular rate and rhythm Abdomen: soft, non-tender; no masses,  no organomegaly Extremities: extremities normal, atraumatic, no cyanosis or edema Skin: Skin color, texture, turgor normal. No rashes or lesions Lymph nodes: Cervical, supraclavicular, and axillary nodes normal. No abnormal inguinal nodes  palpated Neurologic: Grossly normal   Pelvic: External genitalia:  no lesions              Urethra:  normal appearing urethra with no masses, tenderness or lesions              Bartholin's and Skene's: normal                 Vagina: normal appearing vagina with normal color and discharge, no lesions  Cervix: anteverted              Pap taken: No. Bimanual Exam:  Uterus:  normal size, contour, position, consistency, mobility, non-tender              Adnexa: no mass, fullness, tenderness               Rectovaginal: Confirms               Anus:  normal sphincter tone, no lesions  Chaperone present: no  A:  Well Woman with normal exam  Postmenopausal off HRT 2002 Bursitis of bilateral hips and knee pain with OA  S/P bilateral knee replacements  S/P hiatal hernia repair 11/06/15 Dr. Derrell Lolling with weight loss of 23 lbs.  History of fibromyalgia Esophageal stricture with dilatation 10/2013, GERD  OAB    P:   Reviewed health and wellness pertinent to exam  Pap smear not done  Mammogram is due 04/2016 - pt declines and wants to do every other year  Will try OTC Oxytrol patch to see if OAB symptoms improve without taking oral med's - she will keep me informed.  She is given the potential SE.  Counseled on breast self exam, mammography screening, adequate intake of calcium and vitamin D, diet and exercise, Kegel's exercises return annually or prn  An After Visit Summary was printed and given to the patient.

## 2016-04-21 ENCOUNTER — Telehealth: Payer: Self-pay | Admitting: Nurse Practitioner

## 2016-04-21 NOTE — Telephone Encounter (Signed)
Pt was called to let her know that we got the MR from PCP.  The immunization record is not complete and we still do not have a date for TDaP.  She was also treated for UTI and was checking to see if she felt better.  If she desires can call back.

## 2016-04-23 ENCOUNTER — Encounter: Payer: Self-pay | Admitting: Nurse Practitioner

## 2016-04-27 ENCOUNTER — Ambulatory Visit: Payer: Medicare Other | Admitting: Rheumatology

## 2016-04-28 DIAGNOSIS — M8589 Other specified disorders of bone density and structure, multiple sites: Secondary | ICD-10-CM | POA: Insufficient documentation

## 2016-04-28 DIAGNOSIS — Z96653 Presence of artificial knee joint, bilateral: Secondary | ICD-10-CM | POA: Insufficient documentation

## 2016-04-28 DIAGNOSIS — F5101 Primary insomnia: Secondary | ICD-10-CM | POA: Insufficient documentation

## 2016-04-28 DIAGNOSIS — M19041 Primary osteoarthritis, right hand: Secondary | ICD-10-CM | POA: Insufficient documentation

## 2016-04-28 DIAGNOSIS — Z8669 Personal history of other diseases of the nervous system and sense organs: Secondary | ICD-10-CM | POA: Insufficient documentation

## 2016-04-28 DIAGNOSIS — M170 Bilateral primary osteoarthritis of knee: Secondary | ICD-10-CM | POA: Insufficient documentation

## 2016-04-28 DIAGNOSIS — M19042 Primary osteoarthritis, left hand: Secondary | ICD-10-CM

## 2016-04-28 DIAGNOSIS — M797 Fibromyalgia: Secondary | ICD-10-CM | POA: Insufficient documentation

## 2016-04-28 NOTE — Progress Notes (Deleted)
Office Visit Note  Patient: Miranda Romero             Date of Birth: December 22, 1945           MRN: [id number]             PCP: Gaspar Garbe, MD Referring: Gaspar Garbe, MD Visit Date: 05/06/2016 Occupation: @GUAROCC @    Subjective:  No chief complaint on file.   History of Present Illness: Miranda Romero is a 71 y.o. female ***   Activities of Daily Living:  Patient reports morning stiffness for *** {minute/hour:19697}.   Patient {ACTIONS;DENIES/REPORTS:21021675::"Denies"} nocturnal pain.  Difficulty dressing/grooming: {ACTIONS;DENIES/REPORTS:21021675::"Denies"} Difficulty climbing stairs: {ACTIONS;DENIES/REPORTS:21021675::"Denies"} Difficulty getting out of chair: {ACTIONS;DENIES/REPORTS:21021675::"Denies"} Difficulty using hands for taps, buttons, cutlery, and/or writing: {ACTIONS;DENIES/REPORTS:21021675::"Denies"}   No Rheumatology ROS completed.   PMFS History:  Patient Active Problem List   Diagnosis Date Noted  . Fibromyalgia 04/28/2016  . Primary osteoarthritis of both knees 04/28/2016  . History of total knee replacement, bilateral 04/28/2016  . Primary osteoarthritis of both hands 04/28/2016  . Primary insomnia 04/28/2016  . History of sleep apnea 04/28/2016  . History of restless legs syndrome 04/28/2016  . Osteopenia of multiple sites 04/28/2016  . Hiatal hernia with gastroesophageal reflux 11/06/2015  . Incisional hernia 10/16/2010  . HYPERTENSION 05/14/2010  . OBSTRUCTIVE SLEEP APNEA 05/14/2010    Past Medical History:  Diagnosis Date  . Arthritis   . Blood transfusion   . Fibromyalgia    Dr Corliss Skains  . GERD (gastroesophageal reflux disease)   . Hiatal hernia with gastroesophageal reflux 11/06/2015  . History of hiatal hernia   . Hyperlipidemia   . Hypertension    Dr Wylene Simmer LOV 4/13 on chart  . Seasonal allergies   . Sleep apnea    SEVERE  per sleep study 3/12 EPIC no CPAP   . Transfusion history    3 yrs ago s/p surgery for  knee  . Umbilical hernia   . Weight decrease     Family History  Problem Relation Age of Onset  . Lymphoma Mother    Past Surgical History:  Procedure Laterality Date  . ANKLE SURGERY    . APPENDECTOMY  2014  . BALLOON DILATION N/A 06/05/2015   Procedure: BALLOON DILATION;  Surgeon: Willis Modena, MD;  Location: [facility];  Service: Endoscopy;  Laterality: N/A;  . CATARACT EXTRACTION Bilateral 05/2013  . CHOLECYSTECTOMY    . DILATION AND CURETTAGE OF UTERUS    . ESOPHAGOGASTRODUODENOSCOPY (EGD) WITH PROPOFOL N/A 06/05/2015   Procedure: ESOPHAGOGASTRODUODENOSCOPY (EGD) WITH PROPOFOL;  Surgeon: Willis Modena, MD;  [facility];  Service: Endoscopy;  Laterality: N/A;  . HERNIA REPAIR  05/2013   due to appendectomy with mesh  . HIATAL HERNIA REPAIR N/A 11/06/2015   Procedure: LAPAROSCOPIC REPAIR HIATAL HERNIA, Nissen FUNDOPLICATION;  Surgeon: Claud Kelp, MD;  Location: [facility];  Service: General;  Laterality: N/A;  . JOINT REPLACEMENT    . KNEE ARTHROPLASTY     bilateral  . knee infection     right knee hardware removed 11/12/   replaced 1/13  . VENTRAL HERNIA REPAIR  07/21/2011   Procedure: LAPAROSCOPIC VENTRAL HERNIA;  Surgeon: Ernestene Mention, MD;  Location: [facility];  Service: General;  Laterality: N/A;  Laparoscopic Repair Verntal Incisional Hernia and Mesh   Social History   Social History Narrative  . No narrative on file     Objective: Vital Signs: LMP 09/20/2000 (Exact Date)    Physical Exam  Musculoskeletal Exam: ***  CDAI Exam: No CDAI exam completed.    Investigation: No additional findings.   Imaging: No results found.  Speciality Comments: No specialty comments available.    Procedures:  No procedures performed Allergies: Sudafed [pseudoephedrine hcl]   Assessment / Plan:     Visit Diagnoses: Fibromyalgia  Primary osteoarthritis of both knees  History of total knee replacement, bilateral  Primary osteoarthritis of both  hands  Primary insomnia  History of sleep apnea  History of restless legs syndrome  Osteopenia of multiple sites    Orders: No orders of the defined types were placed in this encounter.  No orders of the defined types were placed in this encounter.   Face-to-face time spent with patient was *** minutes. 50% of time was spent in counseling and coordination of care.  Follow-Up Instructions: No Follow-up on file.   Remy Dia, RT  Note - This record has been created using [employer] software.  Chart creation errors have been sought, but may not always  have been located. Such creation errors do not reflect on  the standard of medical care.

## 2016-05-04 NOTE — Progress Notes (Deleted)
Office Visit Note  Patient: Miranda Romero             Date of Birth: 05-23-1945           MRN: [id number]             PCP: Gaspar Garbe, MD Referring: Gaspar Garbe, MD Visit Date: 05/08/2016 Occupation: @GUAROCC @    Subjective:  No chief complaint on file.   History of Present Illness: Miranda F Fout is a 71 y.o. female ***   Activities of Daily Living:  Patient reports morning stiffness for *** {minute/hour:19697}.   Patient {ACTIONS;DENIES/REPORTS:21021675::"Denies"} nocturnal pain.  Difficulty dressing/grooming: {ACTIONS;DENIES/REPORTS:21021675::"Denies"} Difficulty climbing stairs: {ACTIONS;DENIES/REPORTS:21021675::"Denies"} Difficulty getting out of chair: {ACTIONS;DENIES/REPORTS:21021675::"Denies"} Difficulty using hands for taps, buttons, cutlery, and/or writing: {ACTIONS;DENIES/REPORTS:21021675::"Denies"}   No Rheumatology ROS completed.   PMFS History:  Patient Active Problem List   Diagnosis Date Noted  . Fibromyalgia 04/28/2016  . Primary osteoarthritis of both knees 04/28/2016  . History of total knee replacement, bilateral 04/28/2016  . Primary osteoarthritis of both hands 04/28/2016  . Primary insomnia 04/28/2016  . History of sleep apnea 04/28/2016  . History of restless legs syndrome 04/28/2016  . Osteopenia of multiple sites 04/28/2016  . Hiatal hernia with gastroesophageal reflux 11/06/2015  . Incisional hernia 10/16/2010  . HYPERTENSION 05/14/2010  . OBSTRUCTIVE SLEEP APNEA 05/14/2010    Past Medical History:  Diagnosis Date  . Arthritis   . Blood transfusion   . Fibromyalgia    Dr Corliss Skains  . GERD (gastroesophageal reflux disease)   . Hiatal hernia with gastroesophageal reflux 11/06/2015  . History of hiatal hernia   . Hyperlipidemia   . Hypertension    Dr Wylene Simmer LOV 4/13 on chart  . Seasonal allergies   . Sleep apnea    SEVERE  per sleep study 3/12 EPIC no CPAP   . Transfusion history    3 yrs ago s/p surgery for  knee  . Umbilical hernia   . Weight decrease     Family History  Problem Relation Age of Onset  . Lymphoma Mother    Past Surgical History:  Procedure Laterality Date  . ANKLE SURGERY    . APPENDECTOMY  2014  . BALLOON DILATION N/A 06/05/2015   Procedure: BALLOON DILATION;  Surgeon: Willis Modena, MD;  [facility];  Service: Endoscopy;  Laterality: N/A;  . CATARACT EXTRACTION Bilateral 05/2013  . CHOLECYSTECTOMY    . DILATION AND CURETTAGE OF UTERUS    . ESOPHAGOGASTRODUODENOSCOPY (EGD) WITH PROPOFOL N/A 06/05/2015   Procedure: ESOPHAGOGASTRODUODENOSCOPY (EGD) WITH PROPOFOL;  Surgeon: Willis Modena, MD;  [facility];  Service: Endoscopy;  Laterality: N/A;  . HERNIA REPAIR  05/2013   due to appendectomy with mesh  . HIATAL HERNIA REPAIR N/A 11/06/2015   Procedure: LAPAROSCOPIC REPAIR HIATAL HERNIA, Nissen FUNDOPLICATION;  Surgeon: Claud Kelp, MD;  [facility];  Service: General;  Laterality: N/A;  . JOINT REPLACEMENT    . KNEE ARTHROPLASTY     bilateral  . knee infection     right knee hardware removed 11/12/   replaced 1/13  . VENTRAL HERNIA REPAIR  07/21/2011   Procedure: LAPAROSCOPIC VENTRAL HERNIA;  Surgeon: Ernestene Mention, MD;  [facility]: [facility];  Service: General;  Laterality: N/A;  Laparoscopic Repair Verntal Incisional Hernia and Mesh   Social History   Social History Narrative  . No narrative on file     Objective: Vital Signs: LMP 09/20/2000 (Exact Date)    Physical Exam  Musculoskeletal Exam: ***  CDAI Exam: No CDAI exam completed.    Investigation: Findings:  10/08/2015 UDS 04/29/2015 Narcotic contract on file     Imaging: No results found.  Speciality Comments: No specialty comments available.    Procedures:  No procedures performed Allergies: Sudafed [pseudoephedrine hcl]   Assessment / Plan:     Visit Diagnoses: Fibromyalgia  History of restless legs syndrome  History of sleep apnea  History of total  knee replacement, bilateral  Primary insomnia  Primary osteoarthritis of both knees  Osteopenia of multiple sites  Primary osteoarthritis of both hands  History of gastroesophageal reflux (GERD)  History of hypertension    Orders: No orders of the defined types were placed in this encounter.  No orders of the defined types were placed in this encounter.   Face-to-face time spent with patient was *** minutes. 50% of time was spent in counseling and coordination of care.  Follow-Up Instructions: No Follow-up on file.   Amy Littrell, RT  Note - This record has been created using [employer] software.  Chart creation errors have been sought, but may not always  have been located. Such creation errors do not reflect on  the standard of medical care.

## 2016-05-06 ENCOUNTER — Ambulatory Visit: Payer: Medicare Other | Admitting: Rheumatology

## 2016-05-08 ENCOUNTER — Ambulatory Visit (INDEPENDENT_AMBULATORY_CARE_PROVIDER_SITE_OTHER): Payer: Medicare Other | Admitting: Rheumatology

## 2016-05-08 ENCOUNTER — Ambulatory Visit: Payer: Medicare Other | Admitting: Rheumatology

## 2016-05-08 ENCOUNTER — Encounter: Payer: Self-pay | Admitting: Rheumatology

## 2016-05-08 VITALS — BP 118/78 | HR 78 | Resp 16 | Ht 65.0 in | Wt 204.0 lb

## 2016-05-08 DIAGNOSIS — Z8719 Personal history of other diseases of the digestive system: Secondary | ICD-10-CM

## 2016-05-08 DIAGNOSIS — M797 Fibromyalgia: Secondary | ICD-10-CM

## 2016-05-08 DIAGNOSIS — Z96653 Presence of artificial knee joint, bilateral: Secondary | ICD-10-CM | POA: Diagnosis not present

## 2016-05-08 DIAGNOSIS — M19041 Primary osteoarthritis, right hand: Secondary | ICD-10-CM | POA: Diagnosis not present

## 2016-05-08 DIAGNOSIS — Z8669 Personal history of other diseases of the nervous system and sense organs: Secondary | ICD-10-CM

## 2016-05-08 DIAGNOSIS — M19042 Primary osteoarthritis, left hand: Secondary | ICD-10-CM

## 2016-05-08 DIAGNOSIS — F5101 Primary insomnia: Secondary | ICD-10-CM

## 2016-05-08 DIAGNOSIS — Z5181 Encounter for therapeutic drug level monitoring: Secondary | ICD-10-CM | POA: Diagnosis not present

## 2016-05-08 DIAGNOSIS — M170 Bilateral primary osteoarthritis of knee: Secondary | ICD-10-CM

## 2016-05-08 DIAGNOSIS — M8589 Other specified disorders of bone density and structure, multiple sites: Secondary | ICD-10-CM

## 2016-05-08 MED ORDER — GABAPENTIN 300 MG PO CAPS: 300.0000 mg | ORAL_CAPSULE | Freq: Two times a day (BID) | ORAL | 1 refills | Status: DC

## 2016-05-08 MED ORDER — TRAMADOL HCL 50 MG PO TABS: 50.0000 mg | ORAL_TABLET | Freq: Two times a day (BID) | ORAL | 2 refills | Status: DC | PRN

## 2016-05-08 NOTE — Progress Notes (Signed)
Office Visit Note  Patient: Miranda Romero             Date of Birth: Jun 23, 1945           MRN: [id number]             PCP: Gaspar Garbeichard W Tisovec, MD Referring: Gaspar Garbeisovec, Richard W, MD Visit Date: 05/08/2016 Occupation: @GUAROCC @    Subjective:  Generalized pain   History of Present Illness: Miranda F Gott is a 71 y.o. female with history of fibromyalgia syndrome and osteoarthritis. She states that she came off Celebrex about 6 months ago due to elevation of her LFTs. while she was off her Celebrex she started experiencing increased pain and she resume Celebrex in January 2018. Describes her pain on a scale of 0-10 about 7. Her pain has involved her all extremities arm. The cold weather causes the pain to get worse. She's been taking tramadol on when necessary basis which is been helpful. She also reports increased pain in her bilateral hip area. She was seen by her orthopedic does surgeon at [facility] who did x-rays of her hip joints. [facility] showed severe osteoarthritis of her left hip joint. As the discomfort was mostly in the trochanteric area she had trochanteric bursae injections there which were helpful. She also had x-rays of her bilateral knee replacement which appears to be stable.  Activities of Daily Living:  Patient reports morning stiffness for 1 hour.   Patient Reports nocturnal pain.  Difficulty dressing/grooming: Denies Difficulty climbing stairs: Reports Difficulty getting out of chair: Reports Difficulty using hands for taps, buttons, cutlery, and/or writing: Denies   Review of Systems  Constitutional: Positive for fatigue and weakness. Negative for night sweats, weight gain and weight loss.  HENT: Negative for mouth sores, trouble swallowing, trouble swallowing, mouth dryness and nose dryness.   Eyes: Negative for pain, redness, visual disturbance and dryness.  Respiratory: Negative for cough, shortness of  breath and difficulty breathing.   Cardiovascular: Negative for chest pain, palpitations, hypertension, irregular heartbeat and swelling in legs/feet.  Gastrointestinal: Negative for blood in stool, constipation and diarrhea.  Endocrine: Negative for increased urination.  Genitourinary: Negative for vaginal dryness.  Musculoskeletal: Positive for arthralgias, joint pain and morning stiffness. Negative for joint swelling, myalgias, muscle weakness, muscle tenderness and myalgias.  Skin: Negative for color change, rash, hair loss, skin tightness, ulcers and sensitivity to sunlight.  Allergic/Immunologic: Negative for susceptible to infections.  Neurological: Negative for dizziness, memory loss and night sweats.  Hematological: Negative for swollen glands.  Psychiatric/Behavioral: Negative for depressed mood and sleep disturbance. The patient is not nervous/anxious.     PMFS History:  Patient Active Problem List   Diagnosis Date Noted  . Fibromyalgia 04/28/2016  . Primary osteoarthritis of both knees 04/28/2016  . History of total knee replacement, bilateral 04/28/2016  . Primary osteoarthritis of both hands 04/28/2016  . Primary insomnia 04/28/2016  . History of sleep apnea 04/28/2016  . History of restless legs syndrome 04/28/2016  . Osteopenia of multiple sites 04/28/2016  . Hiatal hernia with gastroesophageal reflux 11/06/2015  . Incisional hernia 10/16/2010  . HYPERTENSION 05/14/2010  . OBSTRUCTIVE SLEEP APNEA 05/14/2010    Past Medical History:  Diagnosis Date  . Arthritis   . Blood transfusion   . Fibromyalgia    Dr Corliss Skainseveshwar  . GERD (gastroesophageal reflux disease)   . Hiatal hernia with gastroesophageal reflux 11/06/2015  . History of hiatal hernia   .  Hyperlipidemia   . Hypertension    Dr Wylene Simmer LOV 4/13 on chart  . Seasonal allergies   . Sleep apnea    SEVERE  per sleep study 3/12 EPIC no CPAP   . Transfusion history    3 yrs ago s/p surgery for knee  .  Umbilical hernia   . Weight decrease     Family History  Problem Relation Age of Onset  . Lymphoma Mother    Past Surgical History:  Procedure Laterality Date  . ANKLE SURGERY    . APPENDECTOMY  2014  . BALLOON DILATION N/A 06/05/2015   Procedure: BALLOON DILATION;  Surgeon: Willis Modena, MD;  [facility];  Service: Endoscopy;  Laterality: N/A;  . CATARACT EXTRACTION Bilateral 05/2013  . CHOLECYSTECTOMY    . DILATION AND CURETTAGE OF UTERUS    . ESOPHAGOGASTRODUODENOSCOPY (EGD) WITH PROPOFOL N/A 06/05/2015   Procedure: ESOPHAGOGASTRODUODENOSCOPY (EGD) WITH PROPOFOL;  Surgeon: Willis Modena, MD;  [facility];  Service: Endoscopy;  Laterality: N/A;  . HERNIA REPAIR  05/2013   due to appendectomy with mesh  . HIATAL HERNIA REPAIR N/A 11/06/2015   Procedure: LAPAROSCOPIC REPAIR HIATAL HERNIA, Nissen FUNDOPLICATION;  Surgeon: Claud Kelp, MD;  [facility]: [facility];  Service: General;  Laterality: N/A;  . JOINT REPLACEMENT    . KNEE ARTHROPLASTY     bilateral  . knee infection     right knee hardware removed 11/12/   replaced 1/13  . VENTRAL HERNIA REPAIR  07/21/2011   Procedure: LAPAROSCOPIC VENTRAL HERNIA;  Surgeon: Ernestene Mention, MD;  [facility]: [facility];  Service: General;  Laterality: N/A;  Laparoscopic Repair Verntal Incisional Hernia and Mesh   Social History   Social History Narrative  . No narrative on file     Objective: Vital Signs: BP 118/78   Pulse 78   Resp 16   Ht 5\' 5"  (1.651 m)   Wt 204 lb (92.5 kg)   LMP 09/20/2000 (Exact Date)   BMI 33.95 kg/m    Physical Exam  Constitutional: She is oriented to person, place, and time. She appears well-developed and well-nourished.  HENT:  Head: Normocephalic and atraumatic.  Eyes: Conjunctivae and EOM are normal.  Neck: Normal range of motion.  Cardiovascular: Normal rate, regular rhythm, normal heart sounds and intact distal pulses.   Pulmonary/Chest: Effort normal and breath sounds normal.    Abdominal: Soft. Bowel sounds are normal.  Lymphadenopathy:    She has no cervical adenopathy.  Neurological: She is alert and oriented to person, place, and time.  Skin: Skin is warm and dry. Capillary refill takes less than 2 seconds.  Psychiatric: She has a normal mood and affect. Her behavior is normal.  Nursing note and vitals reviewed.    Musculoskeletal Exam: C-spine and thoracic lumbar spine good range of motion. Shoulder joints elbow joints wrist joint MCPs PIPs DIPs are good range of motion with no synovitis. She has very limited range of motion of her left hip joint and also limited range of motion of her right hip joint. Her bilateral knees are replaced which is doing well. She has generalized hyperalgesia with thumb 16 out of 18 positive tender points of fibromyalgia.  CDAI Exam: No CDAI exam completed.    Investigation: Findings:  10/08/2015 UDS 04/29/2015 Narcotic contract on file  04/09/2016 CBC normal CMC normal at PCPs office    Imaging: No results found.  Speciality Comments: No specialty comments available.    Procedures:  No procedures performed Allergies: Sudafed [  pseudoephedrine hcl]   Assessment / Plan:     Visit Diagnoses: Fibromyalgia she has active disease with generalized pain and discomfort and positive tender points. She's currently on Celebrex. The Celebrex was discontinued in August and her LFTs were AST 168 and ALT 231. She'll resume Celebrex sometimes in January 2018. She's not had any liver functions since then which I could find. We had detailed discussion regarding pain management. She states if she takes one tramadol a day that controlled her symptoms quite well. She does not do well on Tylenol. With her elevated LFTs being elevated I think tramadol is a better choice than using Celebrex or Tylenol. Indications side effects contraindications of long term use of tramadol was discussed.  Primary osteoarthritis of both knees: She had bilateral  total knee replacement which is doing well  Osteoarthritis of bilateral hip joints: Left severe and right moderate. Patient does not want total hip replacement yet.  Primary insomnia: Better with medications  Osteopenia of multiple sites: She brought her bone density report from 04/09/2016 today which is within normal limits. Her worst T score is -0.9.  Primary osteoarthritis of both hands  History of total knee replacement, bilateral  History of sleep apnea  History of restless legs syndrome  History of hiatal hernia    Orders: No orders of the defined types were placed in this encounter.  Meds ordered this encounter  Medications  . traMADol (ULTRAM) 50 MG tablet    Sig: Take 1 tablet (50 mg total) by mouth 2 (two) times daily as needed.    Dispense:  60 tablet    Refill:  2    Face-to-face time spent with patient was 30 minutes. 50% of time was spent in counseling and coordination of care.  Follow-Up Instructions: Return in about 6 months (around 11/05/2016) for Osteoarthritis, Fibromyalgia.   Pollyann Savoy, MD  Note - This record has been created using Animal nutritionist.  Chart creation errors have been sought, but may not always  have been located. Such creation errors do not reflect on  the standard of medical care.

## 2016-05-14 ENCOUNTER — Ambulatory Visit (INDEPENDENT_AMBULATORY_CARE_PROVIDER_SITE_OTHER): Payer: Medicare Other | Admitting: Orthopaedic Surgery

## 2016-05-14 ENCOUNTER — Encounter (INDEPENDENT_AMBULATORY_CARE_PROVIDER_SITE_OTHER): Payer: Self-pay | Admitting: Orthopaedic Surgery

## 2016-05-14 VITALS — BP 115/78 | HR 93 | Ht 65.0 in | Wt 202.0 lb

## 2016-05-14 DIAGNOSIS — M25552 Pain in left hip: Secondary | ICD-10-CM | POA: Diagnosis not present

## 2016-05-14 NOTE — Progress Notes (Signed)
Office Visit Note   Patient: Miranda Romero           Date of Birth: [birth year]           MRN: [id number] Visit Date: 05/14/2016              Requested by: Miranda Garbe, MD [address] [place], [place] [address] PCP: Miranda Garbe, MD   Assessment & Plan: Visit Diagnoses: Osteoarthritis left hip  Plan: Consult Miranda Romero for intra-articular cortisone injection left hip. Long discussion regarding possible etiologies of her left hip pain including hip arthritis, referred pain from her back, fibromyalgia, and greater trochanteric bursitis. We'll evaluate response to the intra-articular cortisone injection.  Follow-Up Instructions: No Follow-up on file.   Orders:  No orders of the defined types were placed in this encounter.  No orders of the defined types were placed in this encounter.     Procedures: No procedures performed   Clinical Data: No additional findings.   Subjective: No chief complaint on file.   Pt presents with Left hip pain, previously seen at the [facility], told her she had bone on bone and she received an injection of cortisone at the time, approx. 3 months ago.Marland Kitchen She was seen recently by Miranda Romero regarding her fibromyalgia pain, possible bursa issue and pt says she wants to consults about a possible hip replacement. Pt last seen by Miranda Romero on 05/08/16.  Miranda Romero as had prior bilateral knee replacements at the [facility]. She was recently evaluated there is a on a home in [place] and was told that she might have a  greater trochanteric bursitis of her left hip as a cause of her pain. A cortisone injection was performed with partial relief of her pain. She has osteoarthritis of her left hip and is concerned that the residual pain is a result of the hip arthritis. She is here for further evaluation  Review of Systems   Objective: Vital Signs: LMP 09/20/2000 (Exact Date)   Physical Exam  Ortho Exam painless range of motion  of both total knee replacements. Minimal pain with internal/external rotation of her left versus right hip with little if any loss of motion. No significant pain over the greater trochanter of the left hip. Patient does have history of fibromyalgia. Straight leg raise is negative bilaterally.  Specialty Comments:  No specialty comments available.  Imaging: No results found.   PMFS History: Patient Active Problem List   Diagnosis Date Noted  . Fibromyalgia 04/28/2016  . Primary osteoarthritis of both knees 04/28/2016  . History of total knee replacement, bilateral 04/28/2016  . Primary osteoarthritis of both hands 04/28/2016  . Primary insomnia 04/28/2016  . History of sleep apnea 04/28/2016  . History of restless legs syndrome 04/28/2016  . Osteopenia of multiple sites 04/28/2016  . Hiatal hernia with gastroesophageal reflux 11/06/2015  . Incisional hernia 10/16/2010  . HYPERTENSION 05/14/2010  . OBSTRUCTIVE SLEEP APNEA 05/14/2010   Past Medical History:  Diagnosis Date  . Arthritis   . Blood transfusion   . Fibromyalgia    Dr Corliss Romero  . GERD (gastroesophageal reflux disease)   . Hiatal hernia with gastroesophageal reflux 11/06/2015  . History of hiatal hernia   . Hyperlipidemia   . Hypertension    Dr Wylene Romero LOV 4/13 on chart  . Seasonal allergies   . Sleep apnea    SEVERE  per sleep study 3/12 EPIC no CPAP   . Transfusion history  3 yrs ago s/p surgery for knee  . Umbilical hernia   . Weight decrease     Family History  Problem Relation Age of Onset  . Lymphoma Mother     Past Surgical History:  Procedure Laterality Date  . ANKLE SURGERY    . APPENDECTOMY  2014  . BALLOON DILATION N/A 06/05/2015   Procedure: BALLOON DILATION;  Surgeon: Miranda ModenaWilliam Outlaw, MD;  [facility];  Service: Endoscopy;  Laterality: N/A;  . CATARACT EXTRACTION Bilateral 05/2013  . CHOLECYSTECTOMY    . DILATION AND CURETTAGE OF UTERUS    . ESOPHAGOGASTRODUODENOSCOPY (EGD) WITH  PROPOFOL N/A 06/05/2015   Procedure: ESOPHAGOGASTRODUODENOSCOPY (EGD) WITH PROPOFOL;  Surgeon: Miranda ModenaWilliam Outlaw, MD;  [facility];  Service: Endoscopy;  Laterality: N/A;  . HERNIA REPAIR  05/2013   due to appendectomy with mesh  . HIATAL HERNIA REPAIR N/A 11/06/2015   Procedure: LAPAROSCOPIC REPAIR HIATAL HERNIA, Nissen FUNDOPLICATION;  Surgeon: Miranda KelpHaywood Ingram, MD;  Location: [facility];  Service: General;  Laterality: N/A;  . JOINT REPLACEMENT    . KNEE ARTHROPLASTY     bilateral  . knee infection     right knee hardware removed 11/12/   replaced 1/13  . VENTRAL HERNIA REPAIR  07/21/2011   Procedure: LAPAROSCOPIC VENTRAL HERNIA;  Surgeon: Miranda MentionHaywood M Ingram, MD;  Location: [facility];  Service: General;  Laterality: N/A;  Laparoscopic Repair Verntal Incisional Hernia and Mesh   Social History   Occupational History  . Speech Pathologist Retired    Social History Main Topics  . Smoking status: Never Smoker  . Smokeless tobacco: Never Used  . Alcohol use Yes     Comment: socially  . Drug use: No  . Sexual activity: Yes    Partners: Male    Birth control/ protection: Post-menopausal

## 2016-05-27 ENCOUNTER — Ambulatory Visit (INDEPENDENT_AMBULATORY_CARE_PROVIDER_SITE_OTHER): Payer: Medicare Other | Admitting: Physical Medicine and Rehabilitation

## 2016-05-27 ENCOUNTER — Encounter (INDEPENDENT_AMBULATORY_CARE_PROVIDER_SITE_OTHER): Payer: Self-pay | Admitting: Physical Medicine and Rehabilitation

## 2016-05-27 ENCOUNTER — Ambulatory Visit (INDEPENDENT_AMBULATORY_CARE_PROVIDER_SITE_OTHER): Payer: Medicare Other

## 2016-05-27 VITALS — BP 124/83 | HR 72

## 2016-05-27 DIAGNOSIS — M25552 Pain in left hip: Secondary | ICD-10-CM | POA: Diagnosis not present

## 2016-05-27 NOTE — Progress Notes (Signed)
Miranda Romero - 71 y.o. female MRN [id number]  Date of birth: 06-20-1945  Office Visit Note: Visit Date: 05/27/2016 PCP: Gaspar Garbe, MD Referred by: Gaspar Garbe, MD  Subjective: Chief Complaint  Patient presents with  . Left Hip - Pain   HPI: Miranda Romero is a 71 year old female with chronic worsening severe Left hip pain for around 6 months. Gotten worse over time. Denies ay radiating pain down the leg or groin pain. Pain is mostly anterior lateral. Dr. Cleophas Dunker is trying to figure out a source of her pain and does request intra-articular hip injection with anesthetic arthrogram. Patient's history is complicated by fibromyalgia.    ROS Otherwise per HPI.  Assessment & Plan: Visit Diagnoses:  1. Pain in left hip     Plan: Findings:  Right hip anesthetic arthrogram. Patient did seem to have quite a bit relief during the anesthetic phase of her injection.    Meds & Orders: No orders of the defined types were placed in this encounter.   Orders Placed This Encounter  Procedures  . Large Joint Injection/Arthrocentesis  . XR C-ARM NO REPORT    Follow-up: Return if symptoms worsen or fail to improve, 2 weeks, for Dr. Cleophas Dunker.   Procedures: Hip anesthetic arthrogram Date/Time: 05/27/2016 2:17 PM Performed by: Tyrell Antonio Authorized by: Tyrell Antonio   Consent Given by:  Patient Site marked: the procedure site was marked   Timeout: prior to procedure the correct patient, procedure, and site was verified   Indications:  Pain and diagnostic evaluation Location:  Hip Site:  L hip joint Prep: patient was prepped and draped in usual sterile fashion   Needle Size:  22 G Approach:  Anterior Ultrasound Guidance: No   Fluoroscopic Guidance: No   Arthrogram: Yes   Medications:  3 mL bupivacaine 0.5 %; 80 mg triamcinolone acetonide 40 MG/ML Aspiration Attempted: Yes   Patient tolerance:  Patient tolerated the procedure well with no immediate  complications  Arthrogram demonstrated excellent flow of contrast throughout the joint surface without extravasation or obvious defect.  The patient had relief of symptoms during the anesthetic phase of the injection.      No notes on file   Clinical History: No specialty comments available.  She reports that she has never smoked. She has never used smokeless tobacco. No results for input(s): HGBA1C, LABURIC in the last 8760 hours.  Objective:  VS:  HT:    WT:   BMI:     BP:124/83  HR:72bpm  TEMP: ( )  RESP:  Physical Exam  Musculoskeletal:  Hip range of motion is limited particularly with internal rotation but without real groin pain. External rotation does give her some pain laterally.    Ortho Exam Imaging: No results found.  Past Medical/Family/Surgical/Social History: Medications & Allergies reviewed per EMR Patient Active Problem List   Diagnosis Date Noted  . Fibromyalgia 04/28/2016  . Primary osteoarthritis of both knees 04/28/2016  . History of total knee replacement, bilateral 04/28/2016  . Primary osteoarthritis of both hands 04/28/2016  . Primary insomnia 04/28/2016  . History of sleep apnea 04/28/2016  . History of restless legs syndrome 04/28/2016  . Osteopenia of multiple sites 04/28/2016  . Hiatal hernia with gastroesophageal reflux 11/06/2015  . Incisional hernia 10/16/2010  . HYPERTENSION 05/14/2010  . OBSTRUCTIVE SLEEP APNEA 05/14/2010   Past Medical History:  Diagnosis Date  . Arthritis   . Blood transfusion   . Fibromyalgia    Dr Corliss Skains  .  GERD (gastroesophageal reflux disease)   . Hiatal hernia with gastroesophageal reflux 11/06/2015  . History of hiatal hernia   . Hyperlipidemia   . Hypertension    Dr Wylene Simmerisovec LOV 4/13 on chart  . Seasonal allergies   . Sleep apnea    SEVERE  per sleep study 3/12 EPIC no CPAP   . Transfusion history    3 yrs ago s/p surgery for knee  . Umbilical hernia   . Weight decrease    Family History    Problem Relation Age of Onset  . Lymphoma Mother    Past Surgical History:  Procedure Laterality Date  . ANKLE SURGERY    . APPENDECTOMY  2014  . BALLOON DILATION N/A 06/05/2015   Procedure: BALLOON DILATION;  Surgeon: Willis ModenaWilliam Outlaw, MD;  Location: [facility];  Service: Endoscopy;  Laterality: N/A;  . CATARACT EXTRACTION Bilateral 05/2013  . CHOLECYSTECTOMY    . DILATION AND CURETTAGE OF UTERUS    . ESOPHAGOGASTRODUODENOSCOPY (EGD) WITH PROPOFOL N/A 06/05/2015   Procedure: ESOPHAGOGASTRODUODENOSCOPY (EGD) WITH PROPOFOL;  Surgeon: Willis ModenaWilliam Outlaw, MD;  Location: [facility];  Service: Endoscopy;  Laterality: N/A;  . HERNIA REPAIR  05/2013   due to appendectomy with mesh  . HIATAL HERNIA REPAIR N/A 11/06/2015   Procedure: LAPAROSCOPIC REPAIR HIATAL HERNIA, Nissen FUNDOPLICATION;  Surgeon: Claud KelpHaywood Ingram, MD;  Location: [facility];  Service: General;  Laterality: N/A;  . JOINT REPLACEMENT    . KNEE ARTHROPLASTY     bilateral  . knee infection     right knee hardware removed 11/12/   replaced 1/13  . VENTRAL HERNIA REPAIR  07/21/2011   Procedure: LAPAROSCOPIC VENTRAL HERNIA;  Surgeon: Ernestene MentionHaywood M Ingram, MD;  Location: [facility];  Service: General;  Laterality: N/A;  Laparoscopic Repair Verntal Incisional Hernia and Mesh   Social History   Occupational History  . Speech Pathologist Retired    Social History Main Topics  . Smoking status: Never Smoker  . Smokeless tobacco: Never Used  . Alcohol use Yes     Comment: socially  . Drug use: No  . Sexual activity: Yes    Partners: Male    Birth control/ protection: Post-menopausal

## 2016-05-27 NOTE — Patient Instructions (Signed)

## 2016-05-29 MED ORDER — TRIAMCINOLONE ACETONIDE 40 MG/ML IJ SUSP
80.0000 mg | INTRAMUSCULAR | Status: AC | PRN
  Administered 2016-05-27: 80 mg via INTRA_ARTICULAR

## 2016-05-29 MED ORDER — BUPIVACAINE HCL 0.5 % IJ SOLN
3.0000 mL | INTRAMUSCULAR | Status: AC | PRN
  Administered 2016-05-27: 3 mL via INTRA_ARTICULAR

## 2016-06-01 ENCOUNTER — Telehealth: Payer: Self-pay | Admitting: Pharmacist

## 2016-06-01 NOTE — Telephone Encounter (Addendum)
Received a message from patient's insurance regarding a potential clinical concern between tramadol and duloxetine.  I called patient who reports she is currently taking tramadol as needed (approximately once daily) and takes duloxetine 30 mg daily.  She reports she has been on both medications for an extended period of time and denies any adverse effects.  Patient is on low doses of both medications.  I counseled patient on the increased risk of seizures and serotonin syndrome while on more than one medication that affects serotonin.  Patient reports understanding and denies any questions or concerns regarding her medications at this time.    Lilla Shookachel Henderson, Pharm.D., BCPS, CPP Clinical Pharmacist Pager: [phone] Phone: [phone] 06/01/2016 3:22 PM

## 2016-09-18 ENCOUNTER — Telehealth: Payer: Self-pay | Admitting: Rheumatology

## 2016-09-18 NOTE — Telephone Encounter (Signed)
Patient requests a call from you regarding a surgery. Patient states she has not schedule it yet.

## 2016-09-18 NOTE — Telephone Encounter (Signed)
Patient would like to speak to Dr. Corliss Skainseveshwar

## 2016-09-22 ENCOUNTER — Telehealth: Payer: Self-pay | Admitting: Rheumatology

## 2016-09-22 NOTE — Telephone Encounter (Signed)
Patient returned Dr. Fatima Sangereveshwar's call.  Thank you

## 2016-09-22 NOTE — Telephone Encounter (Signed)
I spoke with patient. She states that she is planning to have total hip replacement. She will come and for follow-up visit on Monday to see me.

## 2016-09-25 DIAGNOSIS — M160 Bilateral primary osteoarthritis of hip: Secondary | ICD-10-CM | POA: Insufficient documentation

## 2016-09-25 NOTE — Progress Notes (Signed)
Office Visit Note  Patient: Miranda Romero             Date of Birth: [birth year]           MRN: [id number]             PCP: Gaspar Garbe, MD Referring: Gaspar Garbe, MD Visit Date: 09/28/2016 Occupation: @GUAROCC @    Subjective:  Left hip pain   History of Present Illness: Miranda Romero is a 71 y.o. female with history of fibromyalgia and osteoarthritis. She states after her last visit with me she saw Dr. Cleophas Dunker for osteoarthritis of her left hip joint. She was sent to Dr. Cyndie Chime for cortisone injection. After she moved to [place] she had another cortisone injection in her left hip again in June 2018. The pain persist. She is related to have left total hip replacement now. She will be seeing Dr. Cleophas Dunker later this week to discuss left total hip replacement. Right hip pain is tolerable. She is having a flare of her fibromyalgia as well she describes her pain on scale of 0-10 about 4-5. She continues to have a lot of fatigue as well. She denies any insomnia.  Activities of Daily Living:  Patient reports morning stiffness for 1 hour.   Patient Denies nocturnal pain.  Difficulty dressing/grooming: Denies Difficulty climbing stairs: Reports Difficulty getting out of chair: Reports Difficulty using hands for taps, buttons, cutlery, and/or writing: Denies   Review of Systems  Constitutional: Positive for fatigue. Negative for night sweats, weight gain, weight loss and weakness.  HENT: Negative for mouth sores, trouble swallowing, trouble swallowing, mouth dryness and nose dryness.   Eyes: Negative for pain, redness, visual disturbance and dryness.  Respiratory: Negative for cough, shortness of breath and difficulty breathing.   Cardiovascular: Negative for chest pain, palpitations, hypertension, irregular heartbeat and swelling in legs/feet.  Gastrointestinal: Negative for blood in stool, constipation and diarrhea.  Endocrine: Negative for increased urination.    Genitourinary: Positive for nocturia. Negative for vaginal dryness.  Musculoskeletal: Positive for arthralgias, joint pain, myalgias, morning stiffness and myalgias. Negative for joint swelling, muscle weakness and muscle tenderness.  Skin: Negative for color change, rash, hair loss, skin tightness, ulcers and sensitivity to sunlight.  Allergic/Immunologic: Negative for susceptible to infections.  Neurological: Negative for dizziness, memory loss and night sweats.  Hematological: Negative for swollen glands.  Psychiatric/Behavioral: Negative for depressed mood and sleep disturbance. The patient is not nervous/anxious.     PMFS History:  Patient Active Problem List   Diagnosis Date Noted  . Primary osteoarthritis of both hips 09/25/2016  . Fibromyalgia 04/28/2016  . Primary osteoarthritis of both knees 04/28/2016  . History of total knee replacement, bilateral 04/28/2016  . Primary osteoarthritis of both hands 04/28/2016  . Primary insomnia 04/28/2016  . History of sleep apnea 04/28/2016  . History of restless legs syndrome 04/28/2016  . Osteopenia of multiple sites 04/28/2016  . Hiatal hernia with gastroesophageal reflux 11/06/2015  . Incisional hernia 10/16/2010  . HYPERTENSION 05/14/2010  . OBSTRUCTIVE SLEEP APNEA 05/14/2010    Past Medical History:  Diagnosis Date  . Arthritis   . Blood transfusion   . Fibromyalgia    Dr Corliss Skains  . GERD (gastroesophageal reflux disease)   . Hiatal hernia with gastroesophageal reflux 11/06/2015  . History of hiatal hernia   . Hyperlipidemia   . Hypertension    Dr Wylene Simmer LOV 4/13 on chart  . Seasonal allergies   . Sleep apnea  SEVERE  per sleep study 3/12 EPIC no CPAP   . Transfusion history    3 yrs ago s/p surgery for knee  . Umbilical hernia   . Weight decrease     Family History  Problem Relation Age of Onset  . Lymphoma Mother    Past Surgical History:  Procedure Laterality Date  . ANKLE SURGERY    . APPENDECTOMY  2014   . BALLOON DILATION N/A 06/05/2015   Procedure: BALLOON DILATION;  Surgeon: Willis Modena, MD;  Location: [facility];  Service: Endoscopy;  Laterality: N/A;  . CATARACT EXTRACTION Bilateral 05/2013  . CHOLECYSTECTOMY    . DILATION AND CURETTAGE OF UTERUS    . ESOPHAGOGASTRODUODENOSCOPY (EGD) WITH PROPOFOL N/A 06/05/2015   Procedure: ESOPHAGOGASTRODUODENOSCOPY (EGD) WITH PROPOFOL;  Surgeon: Willis Modena, MD;  [facility]: [facility];  Service: Endoscopy;  Laterality: N/A;  . HERNIA REPAIR  05/2013   due to appendectomy with mesh  . HIATAL HERNIA REPAIR N/A 11/06/2015   Procedure: LAPAROSCOPIC REPAIR HIATAL HERNIA, Nissen FUNDOPLICATION;  Surgeon: Claud Kelp, MD;  Location: [facility];  Service: General;  Laterality: N/A;  . JOINT REPLACEMENT    . KNEE ARTHROPLASTY     bilateral  . knee infection     right knee hardware removed 11/12/   replaced 1/13  . VENTRAL HERNIA REPAIR  07/21/2011   Procedure: LAPAROSCOPIC VENTRAL HERNIA;  Surgeon: Ernestene Mention, MD;  Location: [facility];  Service: General;  Laterality: N/A;  Laparoscopic Repair Verntal Incisional Hernia and Mesh   Social History   Social History Narrative  . No narrative on file     Objective: Vital Signs: BP 124/62   Pulse 74   Resp 16   Ht 5\' 5"  (1.651 m)   Wt 204 lb (92.5 kg)   LMP 09/20/2000 (Exact Date)   BMI 33.95 kg/m    Physical Exam  Constitutional: She is oriented to person, place, and time. She appears well-developed and well-nourished.  HENT:  Head: Normocephalic and atraumatic.  Eyes: Conjunctivae and EOM are normal.  Neck: Normal range of motion.  Cardiovascular: Normal rate, regular rhythm, normal heart sounds and intact distal pulses.   Pulmonary/Chest: Effort normal and breath sounds normal.  Abdominal: Soft. Bowel sounds are normal.  Lymphadenopathy:    She has no cervical adenopathy.  Neurological: She is alert and oriented to person, place, and time.  Skin: Skin is warm and dry. Capillary refill  takes less than 2 seconds.  Psychiatric: She has a normal mood and affect. Her behavior is normal.  Nursing note and vitals reviewed.    Musculoskeletal Exam: C-spine and thoracic spine good range of motion. Shoulder joints elbow joints wrist joint MCPs PIPs with good range of motion. She has mild thickening of PIP/DIP joints in her hands. Right hip joint had fairly good range of motion with no discomfort. Left hip joint had painful limited range of motion. Bilateral total knee replacement is doing well. Fibromyalgia tender points were 16 out of 18 positive.  CDAI Exam: No CDAI exam completed.    Investigation: No additional findings.  01/18 CBC, CMP were normal  Imaging: No results found.  Speciality Comments: No specialty comments available.    Procedures:  No procedures performed Allergies: Sudafed [pseudoephedrine hcl]   Assessment / Plan:     Visit Diagnoses: Fibromyalgia: She is having a flare with generalized pain and discomfort and positive tender points. She will continue Cymbalta and Neurontin.  Primary insomnia: Improved  Primary osteoarthritis of both  hands: Minimal discomfort  She had elevated LFTs in the past and Celebrex. She is off Celebrex now. She's taking ibuprofen occasionally.  Primary osteoarthritis of both hips - Left severe, right moderate: She has severe pain and discomfort in her left hip with limited range of motion. Is affecting her mobility and her activity level. We had detailed discussion regarding left total hip replacement. She will be seeing Dr. Cleophas DunkerWhitfield this week for left total replacement.  History of total knee replacement, bilateral: Doing well  History of restless legs syndrome  Osteopenia of multiple sites - January 2018 DXA  normal  History of gastroesophageal reflux (GERD)  History of sleep apnea  History of hypertension : Her blood pressure is well controlled   Orders: No orders of the defined types were placed in this  encounter.  No orders of the defined types were placed in this encounter.   Face-to-face time spent with patient was 30 minutes. 50% of time was spent in counseling and coordination of care.  Follow-Up Instructions: Return in about 6 months (around 03/31/2017) for Osteoarthritis FMS.   Pollyann SavoyShaili Deveshwar, MD  Note - This record has been created using Animal nutritionistDragon software.  Chart creation errors have been sought, but may not always  have been located. Such creation errors do not reflect on  the standard of medical care.

## 2016-09-28 ENCOUNTER — Encounter: Payer: Self-pay | Admitting: Rheumatology

## 2016-09-28 ENCOUNTER — Ambulatory Visit (INDEPENDENT_AMBULATORY_CARE_PROVIDER_SITE_OTHER): Payer: Medicare Other | Admitting: Rheumatology

## 2016-09-28 VITALS — BP 124/62 | HR 74 | Resp 16 | Ht 65.0 in | Wt 204.0 lb

## 2016-09-28 DIAGNOSIS — Z8679 Personal history of other diseases of the circulatory system: Secondary | ICD-10-CM

## 2016-09-28 DIAGNOSIS — Z8719 Personal history of other diseases of the digestive system: Secondary | ICD-10-CM | POA: Diagnosis not present

## 2016-09-28 DIAGNOSIS — M797 Fibromyalgia: Secondary | ICD-10-CM | POA: Diagnosis not present

## 2016-09-28 DIAGNOSIS — Z8669 Personal history of other diseases of the nervous system and sense organs: Secondary | ICD-10-CM | POA: Diagnosis not present

## 2016-09-28 DIAGNOSIS — Z96653 Presence of artificial knee joint, bilateral: Secondary | ICD-10-CM

## 2016-09-28 DIAGNOSIS — M8589 Other specified disorders of bone density and structure, multiple sites: Secondary | ICD-10-CM

## 2016-09-28 DIAGNOSIS — M160 Bilateral primary osteoarthritis of hip: Secondary | ICD-10-CM | POA: Diagnosis not present

## 2016-09-28 DIAGNOSIS — F5101 Primary insomnia: Secondary | ICD-10-CM

## 2016-09-28 DIAGNOSIS — M19042 Primary osteoarthritis, left hand: Secondary | ICD-10-CM

## 2016-09-28 DIAGNOSIS — M19041 Primary osteoarthritis, right hand: Secondary | ICD-10-CM | POA: Diagnosis not present

## 2016-10-02 ENCOUNTER — Ambulatory Visit (INDEPENDENT_AMBULATORY_CARE_PROVIDER_SITE_OTHER): Payer: Medicare Other | Admitting: Orthopaedic Surgery

## 2016-10-02 ENCOUNTER — Encounter (INDEPENDENT_AMBULATORY_CARE_PROVIDER_SITE_OTHER): Payer: Self-pay | Admitting: Orthopaedic Surgery

## 2016-10-02 VITALS — BP 118/77 | HR 87 | Resp 14 | Ht 65.0 in | Wt 202.0 lb

## 2016-10-02 DIAGNOSIS — M25552 Pain in left hip: Secondary | ICD-10-CM

## 2016-10-02 NOTE — Progress Notes (Signed)
Office Visit Note   Patient: Miranda Romero           Date of Birth: 07/31/1945           MRN: [id number] Visit Date: 10/02/2016              Requested by: Gaspar Garbeisovec, Richard W, MD [address] [place], [place] [id number] PCP: Wylene Simmerisovec, Adelfa Kohichard W, MD   Assessment & Plan: Visit Diagnoses:  1. Pain of left hip joint   Primary osteoarthritis left hip  Plan: After much discussion Miranda Romero would like to proceed with a left hip replacement and I discuss the posterior approach, hospitalization rehabilitation and what she may expect postoperatively. She like to schedule her surgery sometime in September. [facility] and had a cortisone injection about 3 weeks ago. We need to wait at least 2 months before we proceed with a hip replacement. She also has a history of fibromyalgia and is aware that they may complicate her postoperative course. Nonetheless she wishes to proceed knowing that. Presently she really is compromised in her activities given the significant osteoarthritic changes in her left hip. Would like her to work on nonweightbearing exercises and weight loss prior to surgery Follow-Up Instructions: Return will schedule left THR.   Orders:  No orders of the defined types were placed in this encounter.  No orders of the defined types were placed in this encounter.     Procedures: No procedures performed   Clinical Data: No additional findings.   Subjective: Chief Complaint  Patient presents with  . Left Hip - Pain    Miranda Romero is here to discuss R HIp replacement. She ambulates with a cane   Miranda Romero succumb a by her husband and here to discuss left total hip replacement. She's [facility] and wishes [place] He had cortisone injections with only temporary relief. She realizes that she's having significant compromise of her activities and  wishes to proceed. She'll need preoperative clearance by Dr. Wylene Simmerisovec. We'll schedule the surgery in September we. We have discussed the issues of the hip replacement and the posterior approach she is very happy with only the above and didn't have any further questions  HPI  Review of Systems   Objective: Vital Signs: BP 118/77   Pulse 87   Resp 14   Ht 5\' 5"  (1.651 m)   Wt 202 lb (91.6 kg)   LMP 09/20/2000 (Exact Date)   BMI 33.61 kg/m   Physical Exam  Ortho Exam walks with the use of a cane. Based on her left lower extremity pain. Pain on extremes of internal/external rotation of the left hip. Neurovascular exam intact. Both knee replacements appear to be intact without problems. Straight leg raise negative. Percussible back pain. No lower extremity swelling.  Specialty Comments:  No specialty comments available.  Imaging: No results found.   PMFS History: Patient Active Problem List   Diagnosis Date Noted  . Primary osteoarthritis of both hips 09/25/2016  . Fibromyalgia 04/28/2016  . Primary osteoarthritis of both knees 04/28/2016  . History of total knee replacement, bilateral 04/28/2016  . Primary osteoarthritis of both hands 04/28/2016  . Primary insomnia 04/28/2016  . History of sleep apnea 04/28/2016  . History of restless legs syndrome 04/28/2016  . Osteopenia of multiple sites 04/28/2016  . Hiatal hernia with gastroesophageal reflux 11/06/2015  . Incisional hernia 10/16/2010  .  HYPERTENSION 05/14/2010  . OBSTRUCTIVE SLEEP APNEA 05/14/2010   Past Medical History:  Diagnosis Date  . Arthritis   . Blood transfusion   . Fibromyalgia    Dr Corliss Skains  . GERD (gastroesophageal reflux disease)   . Hiatal hernia with gastroesophageal reflux 11/06/2015  . History of hiatal hernia   . Hyperlipidemia   . Hypertension    Dr Wylene Simmer LOV 4/13 on chart  . Seasonal allergies   . Sleep apnea    SEVERE  per sleep study 3/12 EPIC no CPAP   . Transfusion history    3  yrs ago s/p surgery for knee  . Umbilical hernia   . Weight decrease     Family History  Problem Relation Age of Onset  . Lymphoma Mother     Past Surgical History:  Procedure Laterality Date  . ANKLE SURGERY    . APPENDECTOMY  2014  . BALLOON DILATION N/A 06/05/2015   Procedure: BALLOON DILATION;  Surgeon: Willis Modena, MD;  [facility]: [facility];  Service: Endoscopy;  Laterality: N/A;  . CATARACT EXTRACTION Bilateral 05/2013  . CHOLECYSTECTOMY    . DILATION AND CURETTAGE OF UTERUS    . ESOPHAGOGASTRODUODENOSCOPY (EGD) WITH PROPOFOL N/A 06/05/2015   Procedure: ESOPHAGOGASTRODUODENOSCOPY (EGD) WITH PROPOFOL;  Surgeon: Willis Modena, MD;  [facility];  Service: Endoscopy;  Laterality: N/A;  . HERNIA REPAIR  05/2013   due to appendectomy with mesh  . HIATAL HERNIA REPAIR N/A 11/06/2015   Procedure: LAPAROSCOPIC REPAIR HIATAL HERNIA, Nissen FUNDOPLICATION;  Surgeon: Claud Kelp, MD;  Location: [facility];  Service: General;  Laterality: N/A;  . JOINT REPLACEMENT    . KNEE ARTHROPLASTY     bilateral  . knee infection     right knee hardware removed 11/12/   replaced 1/13  . VENTRAL HERNIA REPAIR  07/21/2011   Procedure: LAPAROSCOPIC VENTRAL HERNIA;  Surgeon: Ernestene Mention, MD;  Location: [facility];  Service: General;  Laterality: N/A;  Laparoscopic Repair Verntal Incisional Hernia and Mesh   Social History   Occupational History  . Speech Pathologist Retired    Social History Main Topics  . Smoking status: Never Smoker  . Smokeless tobacco: Never Used  . Alcohol use Yes     Comment: socially  . Drug use: No  . Sexual activity: Yes    Partners: Male    Birth control/ protection: Post-menopausal     Valeria Batman, MD   Note - This record has been created using Animal nutritionist.  Chart creation errors have been sought, but may not always  have been located. Such creation errors do not reflect on  the standard of medical care.

## 2016-10-06 ENCOUNTER — Ambulatory Visit: Payer: Medicare Other | Admitting: Rheumatology

## 2016-10-14 NOTE — Progress Notes (Signed)
Please review

## 2016-11-05 ENCOUNTER — Ambulatory Visit: Payer: Medicare Other | Admitting: Rheumatology

## 2016-11-20 ENCOUNTER — Telehealth: Payer: Self-pay | Admitting: Rheumatology

## 2016-11-20 MED ORDER — DULOXETINE HCL 30 MG PO CPEP: 30.0000 mg | ORAL_CAPSULE | Freq: Every day | ORAL | 0 refills | Status: DC

## 2016-11-20 NOTE — Telephone Encounter (Signed)
Last Visit: 09/28/16 Next Visit due January 2019. Message sent to front to schedule patient.   Okay to refill per Dr. Corliss Skainseveshwar

## 2016-11-20 NOTE — Telephone Encounter (Signed)
Patient out of Cymbalta. Patient completely out. Needs a new rx to [facility]. Please call patient to advise.

## 2016-12-25 ENCOUNTER — Other Ambulatory Visit: Payer: Self-pay | Admitting: Rheumatology

## 2016-12-28 NOTE — Telephone Encounter (Signed)
Last visit: 09/28/16 Next visit: 05/14/17  Ok to refill per Dr. Deveshwar 

## 2017-01-08 ENCOUNTER — Other Ambulatory Visit: Payer: Self-pay | Admitting: Rheumatology

## 2017-01-08 NOTE — Telephone Encounter (Signed)
Last visit: 09/28/16 Next visit: 05/14/17  Ok to refill per Dr. Deveshwar 

## 2017-01-18 ENCOUNTER — Telehealth (INDEPENDENT_AMBULATORY_CARE_PROVIDER_SITE_OTHER): Payer: Self-pay

## 2017-01-18 MED ORDER — DULOXETINE HCL 30 MG PO CPEP: 30.0000 mg | ORAL_CAPSULE | Freq: Every day | ORAL | 0 refills | Status: DC

## 2017-01-18 NOTE — Addendum Note (Signed)
Addended by: Henriette CombsHATTON, Rosene Pilling L on: 01/18/2017 04:04 PM   Modules accepted: Orders

## 2017-01-18 NOTE — Telephone Encounter (Signed)
Last Visit: 09/28/16 Next Visit: 05/14/17  Okay to refill per Dr. Corliss Skainseveshwar

## 2017-01-18 NOTE — Telephone Encounter (Signed)
Patient would like a Rx refill on Cymbalta.  Sent to [facility]. Cb# is [phone].   Please advise.  Thank you.

## 2017-02-18 ENCOUNTER — Other Ambulatory Visit: Payer: Self-pay | Admitting: Rheumatology

## 2017-02-18 NOTE — Telephone Encounter (Signed)
Last visit: 09/28/16 Next visit: 05/14/17  Ok to refill per Dr. Corliss Skainseveshwar

## 2017-02-19 ENCOUNTER — Other Ambulatory Visit: Payer: Self-pay | Admitting: *Deleted

## 2017-02-19 MED ORDER — TRAMADOL HCL 50 MG PO TABS: 50.0000 mg | ORAL_TABLET | Freq: Two times a day (BID) | ORAL | 0 refills | Status: DC | PRN

## 2017-02-19 NOTE — Telephone Encounter (Signed)
ok 

## 2017-02-19 NOTE — Telephone Encounter (Signed)
Last Visit: 09/28/16 Next Visit: 05/14/17 UDS:10/08/2015 Narc Agreement 05/08/16  Okay to refill 30 day supply Tramadol?

## 2017-03-30 ENCOUNTER — Other Ambulatory Visit: Payer: Self-pay

## 2017-03-30 ENCOUNTER — Other Ambulatory Visit: Payer: Self-pay | Admitting: *Deleted

## 2017-03-30 DIAGNOSIS — Z5181 Encounter for therapeutic drug level monitoring: Secondary | ICD-10-CM

## 2017-03-30 MED ORDER — TRAMADOL HCL 50 MG PO TABS: 50.0000 mg | ORAL_TABLET | Freq: Two times a day (BID) | ORAL | 0 refills | Status: DC | PRN

## 2017-03-30 NOTE — Telephone Encounter (Signed)
Refill request received via fax   Last Visit: 09/28/16 Next Visit: 05/14/17 UDS:10/08/2015 Narc Agreement 05/08/16  Patient will update UDS this week  Okay to refill 30 day supply Tramadol?

## 2017-03-31 NOTE — Progress Notes (Signed)
Office Visit Note  Patient: Miranda Romero             Date of Birth: [birth year]           MRN: [id number]             PCP: Haywood Pao, MD Referring: Haywood Pao, MD Visit Date: 04/05/2017 Occupation: _0 @    Subjective:  Other (increased pain )   History of Present Illness: Miranda F Friedlander is a 72 y.o. female with history of fibromyalgia and osteoarthritis. She states she's been experiencing increased pain and discomfort. She describes her pain on the scale of 0-10 about 8 in her upper and lower extremities. She's having nocturnal pain which wakes her up in the middle of the night. She has seen Dr. Durward Fortes for osteoarthritis of her hips. He recommended left total hip replacement. She is not ready for the surgery yet. She had a stem cell injection in her left hip about 3 months ago. She has had minimal relief so far. She still continues to have discomfort in her bilateral knees which have been replaced.  Activities of Daily Living:  Patient reports morning stiffness for 2 hours.   Patient Reports nocturnal pain.  Difficulty dressing/grooming: Denies Difficulty climbing stairs: Reports Difficulty getting out of chair: Reports Difficulty using hands for taps, buttons, cutlery, and/or writing: Reports   Review of Systems  Constitutional: Positive for fatigue. Negative for night sweats, weight gain, weight loss and weakness.  HENT: Positive for mouth dryness. Negative for mouth sores, trouble swallowing, trouble swallowing and nose dryness.   Eyes: Positive for redness. Negative for pain, visual disturbance and dryness.  Respiratory: Negative for cough, shortness of breath and difficulty breathing.   Cardiovascular: Negative for chest pain, palpitations, hypertension, irregular heartbeat and swelling in legs/feet.  Gastrointestinal: Negative for blood in stool, constipation and diarrhea.  Endocrine: Negative for increased urination.  Genitourinary:  Negative for vaginal dryness.  Musculoskeletal: Positive for arthralgias, joint pain, myalgias, morning stiffness and myalgias. Negative for joint swelling, muscle weakness and muscle tenderness.  Skin: Negative for color change, rash, hair loss, skin tightness, ulcers and sensitivity to sunlight.  Allergic/Immunologic: Negative for susceptible to infections.  Neurological: Negative for dizziness, memory loss and night sweats.  Hematological: Negative for swollen glands.  Psychiatric/Behavioral: Positive for depressed mood and sleep disturbance. The patient is not nervous/anxious.     PMFS History:  Patient Active Problem List   Diagnosis Date Noted  . Primary osteoarthritis of both hips 09/25/2016  . Fibromyalgia 04/28/2016  . Primary osteoarthritis of both knees 04/28/2016  . History of total knee replacement, bilateral 04/28/2016  . Primary osteoarthritis of both hands 04/28/2016  . Primary insomnia 04/28/2016  . History of sleep apnea 04/28/2016  . History of restless legs syndrome 04/28/2016  . Osteopenia of multiple sites 04/28/2016  . Hiatal hernia with gastroesophageal reflux 11/06/2015  . Incisional hernia 10/16/2010  . HYPERTENSION 05/14/2010  . OBSTRUCTIVE SLEEP APNEA 05/14/2010    Past Medical History:  Diagnosis Date  . Arthritis   . Blood transfusion   . Fibromyalgia    Dr Estanislado Pandy  . GERD (gastroesophageal reflux disease)   . Hiatal hernia with gastroesophageal reflux 11/06/2015  . History of hiatal hernia   . Hyperlipidemia   . Hypertension    Dr Osborne Casco LOV 4/13 on chart  . Seasonal allergies   . Sleep apnea    SEVERE  per sleep study 3/12 EPIC no CPAP   . Transfusion  history    3 yrs ago s/p surgery for knee  . Umbilical hernia   . Weight decrease     Family History  Problem Relation Age of Onset  . Lymphoma Mother    Past Surgical History:  Procedure Laterality Date  . ANKLE SURGERY    . APPENDECTOMY  2014  . BALLOON DILATION N/A 06/05/2015    Procedure: BALLOON DILATION;  Surgeon: Arta Silence, MD;  Location: [facility];  Service: Endoscopy;  Laterality: N/A;  . CATARACT EXTRACTION Bilateral 05/2013  . CHOLECYSTECTOMY    . DILATION AND CURETTAGE OF UTERUS    . ESOPHAGOGASTRODUODENOSCOPY (EGD) WITH PROPOFOL N/A 06/05/2015   Procedure: ESOPHAGOGASTRODUODENOSCOPY (EGD) WITH PROPOFOL;  Surgeon: Arta Silence, MD;  Location: [facility];  Service: Endoscopy;  Laterality: N/A;  . HERNIA REPAIR  05/2013   due to appendectomy with mesh  . HIATAL HERNIA REPAIR N/A 11/06/2015   Procedure: LAPAROSCOPIC REPAIR HIATAL HERNIA, Nissen FUNDOPLICATION;  Surgeon: Fanny Skates, MD;  Location: [facility];  Service: General;  Laterality: N/A;  . JOINT REPLACEMENT    . KNEE ARTHROPLASTY     bilateral  . knee infection     right knee hardware removed 11/12/   replaced 1/13  . VENTRAL HERNIA REPAIR  07/21/2011   Procedure: LAPAROSCOPIC VENTRAL HERNIA;  Surgeon: Adin Hector, MD;  Location: [facility];  Service: General;  Laterality: N/A;  Laparoscopic Repair Verntal Incisional Hernia and Mesh   Social History   Social History Narrative  . Not on file     Objective: Vital Signs: BP 124/74 (BP Location: Left Arm, Patient Position: Sitting, Cuff Size: Normal)   Pulse 81   Resp 15   Ht 5' 5" (1.651 m)   Wt 215 lb (97.5 kg)   LMP 09/20/2000 (Exact Date)   BMI 35.78 kg/m    Physical Exam  Constitutional: She is oriented to person, place, and time. She appears well-developed and well-nourished.  HENT:  Head: Normocephalic and atraumatic.  Eyes: Conjunctivae and EOM are normal.  Neck: Normal range of motion.  Cardiovascular: Normal rate, regular rhythm, normal heart sounds and intact distal pulses.  Pulmonary/Chest: Effort normal and breath sounds normal.  Abdominal: Soft. Bowel sounds are normal.  Lymphadenopathy:    She has no cervical adenopathy.  Neurological: She is alert and oriented to person, place, and time.  Skin: Skin is warm and  dry. Capillary refill takes less than 2 seconds.  Psychiatric: She has a normal mood and affect. Her behavior is normal.  Nursing note and vitals reviewed.    Musculoskeletal Exam: C-spine and thoracic lumbar spine good range of motion. Shoulder joints elbow joints wrist joint MCPs PIPs DIPs with good range of motion. She had minimal PIP/DIP thickening with no synovitis. She had painful range of motion of bilateral joints with tenderness over bilateral trochanteric bursa consistent with trochanteric bursitis. She has bilateral total knee replacement with painful range of motion. She has generalized hyperalgesia.  CDAI Exam: No CDAI exam completed.    Investigation: No additional findings.   Imaging: No results found.  Speciality Comments: No specialty comments available.    Procedures:  No procedures performed Allergies: Sudafed [pseudoephedrine hcl]   Assessment / Plan:     Visit Diagnoses: Fibromyalgia -she's been having severe pain and discomfort. She has been having nocturnal pain. She describes pain mostly in her arms and lower extremities. she uses tramadol when necessary only. She states tramadol is not as effective anymore. UDS:  Updated Narc agreement:  January 2019. To evaluate her myalgias and would like some of the lab work which she will get through her PCPs office. I've advised her to get CBC, CMP with GFR, CK, ESR, SPEP and vitamin D. We had detailed discussion regarding regular exercise. I will refer her to physical therapy. She's been taking some Advil when necessary. She was on Celebrex in the past but it was discontinued due to elevation of her creatinine.  Primary insomnia: She is having insomnia due to nocturnal pain.  Primary osteoarthritis of both hands: She is a stiffness and discomfort in her hands but no synovitis was noted.  Primary osteoarthritis of both hips: She has arthritis in her both knees which she's not ready for total hip  replacement.  Trochanteric bursitis of both hips: She had tenderness over bilateral trochanteric bursa. I will refer to physical therapy.  History of total knee replacement, bilateral: Chronic pain  Osteopenia of multiple sites - January 2018 DXA  . She's been taking calcium and vitamin D.  History of restless legs syndrome  History of hypertension  History of sleep apnea  History of gastroesophageal reflux (GERD)    Orders: No orders of the defined types were placed in this encounter.  No orders of the defined types were placed in this encounter.   Face-to-face time spent with patient was 50 minutes. Greater than 50% of time was spent in counseling and coordination of care.  Follow-Up Instructions: Return in about 3 months (around 07/04/2017) for FMS, OA .   Bo Merino, MD  Note - This record has been created using Editor, commissioning.  Chart creation errors have been sought, but may not always  have been located. Such creation errors do not reflect on  the standard of medical care.

## 2017-04-01 NOTE — Progress Notes (Signed)
C/w

## 2017-04-02 ENCOUNTER — Telehealth: Payer: Self-pay

## 2017-04-02 NOTE — Telephone Encounter (Signed)
Received a PA request from [employer] for tramadol. Authorization was submitted to pts insurance via cover my meds. Will update once we receive a response.   Twania Bujak, [place], CPhT 12:05 PM

## 2017-04-03 LAB — PAIN MGMT, PROFILE 5 W/CONF, U
Amphetamines: NEGATIVE ng/mL (ref <500)
Barbiturates: NEGATIVE ng/mL (ref <300)
Benzodiazepines: NEGATIVE ng/mL (ref <100)
CREATININE: 206.1 mg/dL
Cocaine Metabolite: NEGATIVE ng/mL (ref <150)
MARIJUANA METABOLITE: NEGATIVE ng/mL (ref <20)
METHADONE METABOLITE: NEGATIVE ng/mL (ref <100)
OXIDANT: NEGATIVE ug/mL (ref <200)
OXYCODONE: NEGATIVE ng/mL (ref <100)
Opiates: NEGATIVE ng/mL (ref <100)
PH: 6.33 (ref 4.5–9.0)

## 2017-04-03 LAB — PAIN MGMT, TRAMADOL W/MEDMATCH, U
Desmethyltramadol: NEGATIVE ng/mL (ref <100)
Tramadol: NEGATIVE ng/mL (ref <100)

## 2017-04-05 ENCOUNTER — Ambulatory Visit: Payer: Medicare Other | Admitting: Rheumatology

## 2017-04-05 ENCOUNTER — Encounter: Payer: Self-pay | Admitting: Rheumatology

## 2017-04-05 VITALS — BP 124/74 | HR 81 | Resp 15 | Ht 65.0 in | Wt 215.0 lb

## 2017-04-05 DIAGNOSIS — Z8669 Personal history of other diseases of the nervous system and sense organs: Secondary | ICD-10-CM | POA: Diagnosis not present

## 2017-04-05 DIAGNOSIS — M797 Fibromyalgia: Secondary | ICD-10-CM | POA: Diagnosis not present

## 2017-04-05 DIAGNOSIS — M19042 Primary osteoarthritis, left hand: Secondary | ICD-10-CM

## 2017-04-05 DIAGNOSIS — Z96653 Presence of artificial knee joint, bilateral: Secondary | ICD-10-CM

## 2017-04-05 DIAGNOSIS — M19041 Primary osteoarthritis, right hand: Secondary | ICD-10-CM | POA: Diagnosis not present

## 2017-04-05 DIAGNOSIS — Z8679 Personal history of other diseases of the circulatory system: Secondary | ICD-10-CM

## 2017-04-05 DIAGNOSIS — M7061 Trochanteric bursitis, right hip: Secondary | ICD-10-CM | POA: Diagnosis not present

## 2017-04-05 DIAGNOSIS — M7062 Trochanteric bursitis, left hip: Secondary | ICD-10-CM

## 2017-04-05 DIAGNOSIS — M160 Bilateral primary osteoarthritis of hip: Secondary | ICD-10-CM

## 2017-04-05 DIAGNOSIS — F5101 Primary insomnia: Secondary | ICD-10-CM

## 2017-04-05 DIAGNOSIS — Z8719 Personal history of other diseases of the digestive system: Secondary | ICD-10-CM | POA: Diagnosis not present

## 2017-04-05 DIAGNOSIS — M8589 Other specified disorders of bone density and structure, multiple sites: Secondary | ICD-10-CM

## 2017-04-05 NOTE — Progress Notes (Signed)
C/w

## 2017-04-05 NOTE — Patient Instructions (Signed)
Please get CBC, CMP with GFR, CK, ESR, SPEP and vitamin D with your PCP. 

## 2017-04-05 NOTE — Telephone Encounter (Signed)
Received a fax from [employer] regarding a prior authorization approval for TRAMADOL from 04/02/2017 to 03/15/2018.   Reference number: [id number] Phone number:[phone]  Will send document to scan center.  Called pt to update. Left message.  Celinda Dethlefs, [place], CPhT 11:49 AM

## 2017-04-08 ENCOUNTER — Telehealth: Payer: Self-pay | Admitting: Rheumatology

## 2017-04-08 DIAGNOSIS — M2550 Pain in unspecified joint: Secondary | ICD-10-CM

## 2017-04-08 DIAGNOSIS — Z79899 Other long term (current) drug therapy: Secondary | ICD-10-CM

## 2017-04-08 DIAGNOSIS — M8589 Other specified disorders of bone density and structure, multiple sites: Secondary | ICD-10-CM

## 2017-04-08 NOTE — Telephone Encounter (Signed)
Patient is getting [facility] with Dr. Wylene Simmerisovec tomorrow 1/25 who is requesting a written order and diagnosis.  Patient states she has the paperwork from her visit (which shows labs to be done and diagnosis), but the Dr.'s office will not accept that.  CB# [phone]

## 2017-04-08 NOTE — Telephone Encounter (Signed)
Patient advised orders have been sent to Dr. Wylene Simmerisovec.

## 2017-04-19 ENCOUNTER — Telehealth: Payer: Self-pay | Admitting: Rheumatology

## 2017-04-19 NOTE — Telephone Encounter (Signed)
Ok thank you for informing us.

## 2017-04-19 NOTE — Telephone Encounter (Signed)
Called patient and she will be coming into the office to have labs drawn one day this week and will bring the report from Dr. Deneen Hartsisovec's office (CBC, CMP). Dr. Wylene Simmerisovec has increased her gabapentin and she also has an appointment with [facility] on Monday.

## 2017-04-19 NOTE — Telephone Encounter (Signed)
Patient called stating that she had her bloodwork done on Friday at Dr. Wylene Simmerisovec office.  She stated that although they did labwork they didn't do the tests that Dr. Corliss Skainseveshwar asked for.  Patient wants to know when she should come in to have that additional labwork done.  Also, due to her pain Dr. Wylene Simmerisovec increased her dose of Gabapentin to 2-3 pills per day to see if that will help.

## 2017-04-23 ENCOUNTER — Ambulatory Visit: Payer: Medicare Other | Admitting: Nurse Practitioner

## 2017-04-27 ENCOUNTER — Telehealth: Payer: Self-pay | Admitting: Rheumatology

## 2017-04-27 DIAGNOSIS — M792 Neuralgia and neuritis, unspecified: Secondary | ICD-10-CM

## 2017-04-27 NOTE — Telephone Encounter (Signed)
Patient called stating that she was referred to [facility] by Dr. Corliss Skainseveshwar.  She was seen a couple of times at their office and they are recommending that patient see a neurologist.  Patient would like to see Miranda Romero but was told that she needed a referral from Dr. Corliss Skainseveshwar before she could make an appointment.

## 2017-04-28 NOTE — Telephone Encounter (Signed)
Spoke with patient and she states that she is wanting to see a neurologist to see what all of the pain is coming from. Patient states that no one has been able to give her any answers.

## 2017-04-29 NOTE — Telephone Encounter (Signed)
Left message to advise patient would speak with Dr. Corliss Skainseveshwar about referral for neurologist.   Patient is wanting to see where pain is coming from and believes it may be a neurological reason. Has seen us and [facility]. We treat for Fibromyalgia, OA hands, OA hips, trochanteric bursitis of both hips and osteopenia. Unsure of what we can use for a neuro referral.

## 2017-05-03 NOTE — Telephone Encounter (Signed)
May refer to Neurology with the dx of neuralgias.

## 2017-05-04 ENCOUNTER — Telehealth: Payer: Self-pay | Admitting: Rheumatology

## 2017-05-04 NOTE — Telephone Encounter (Signed)
Left message to advise patient referral has been placed for neurologist and someone will be in contact with her to schedule appointment.

## 2017-05-04 NOTE — Telephone Encounter (Signed)
Patient called stating that she has another idea of how she can get a referral to see the neurologist.  She states that she has a lot of balance issues and has fallen twice.  Patient requested that you return her call to discuss this.

## 2017-05-12 ENCOUNTER — Other Ambulatory Visit: Payer: Self-pay

## 2017-05-12 ENCOUNTER — Telehealth: Payer: Self-pay | Admitting: Rheumatology

## 2017-05-12 DIAGNOSIS — M170 Bilateral primary osteoarthritis of knee: Secondary | ICD-10-CM

## 2017-05-12 DIAGNOSIS — M160 Bilateral primary osteoarthritis of hip: Secondary | ICD-10-CM

## 2017-05-12 DIAGNOSIS — M8589 Other specified disorders of bone density and structure, multiple sites: Secondary | ICD-10-CM

## 2017-05-12 DIAGNOSIS — M19042 Primary osteoarthritis, left hand: Secondary | ICD-10-CM

## 2017-05-12 DIAGNOSIS — M19041 Primary osteoarthritis, right hand: Secondary | ICD-10-CM

## 2017-05-12 NOTE — Telephone Encounter (Signed)
Patient is going out of the [place] times coming up, and wants to inquire about having maybe a vioded copy of her Tramadol rx to carry with her in case she is stopped. Please call to advise.

## 2017-05-13 NOTE — Telephone Encounter (Signed)
Called patient and advised that was ready and at the front desk so she can pick it up. Miranda Romero listed each med that she and Dr. Corliss Skainseveshwar prescribe on the pad, so the patient has proof that she is prescribed the medications.

## 2017-05-13 NOTE — Telephone Encounter (Signed)
If patient has medication in correct bottle that is labeled correctly, she should not have a problem? Any advice for this?

## 2017-05-13 NOTE — Progress Notes (Signed)
Labs are stable.

## 2017-05-13 NOTE — Telephone Encounter (Signed)
Please make sure her Tramadol is in the original bottle that her medication was dispensed in.  I will document on a prescription pad that she can take with her that she is prescribed tramadol  She can come pick this up tomorrow if she will feel more comfortable taking that with her.

## 2017-05-14 ENCOUNTER — Ambulatory Visit: Payer: Medicare Other | Admitting: Rheumatology

## 2017-05-16 ENCOUNTER — Other Ambulatory Visit: Payer: Self-pay | Admitting: Rheumatology

## 2017-05-17 NOTE — Telephone Encounter (Signed)
Last Visit: 04/05/17 Next Visit: 07/07/17  Okay to refill per Dr. Corliss Skainseveshwar

## 2017-05-18 LAB — CBC WITH DIFFERENTIAL/PLATELET
Basophils Absolute: 41 cells/uL (ref 0–200)
Basophils Relative: 0.6 %
EOS PCT: 2.5 %
Eosinophils Absolute: 173 cells/uL (ref 15–500)
HEMATOCRIT: 39.4 % (ref 35.0–45.0)
Hemoglobin: 13.5 g/dL (ref 11.7–15.5)
LYMPHS ABS: 1939 {cells}/uL (ref 850–3900)
MCH: 30.8 pg (ref 27.0–33.0)
MCHC: 34.3 g/dL (ref 32.0–36.0)
MCV: 89.7 fL (ref 80.0–100.0)
MPV: 9.6 fL (ref 7.5–12.5)
Monocytes Relative: 5.1 %
NEUTROS PCT: 63.7 %
Neutro Abs: 4395 cells/uL (ref 1500–7800)
Platelets: 345 10*3/uL (ref 140–400)
RBC: 4.39 10*6/uL (ref 3.80–5.10)
RDW: 12.6 % (ref 11.0–15.0)
Total Lymphocyte: 28.1 %
WBC mixed population: 352 cells/uL (ref 200–950)
WBC: 6.9 10*3/uL (ref 3.8–10.8)

## 2017-05-18 LAB — PROTEIN ELECTROPHORESIS, SERUM, WITH REFLEX
ALBUMIN ELP: 4.1 g/dL (ref 3.8–4.8)
ALPHA 1: 0.3 g/dL (ref 0.2–0.3)
ALPHA 2: 0.9 g/dL (ref 0.5–0.9)
Beta 2: 0.3 g/dL (ref 0.2–0.5)
Beta Globulin: 0.5 g/dL (ref 0.4–0.6)
GAMMA GLOBULIN: 0.8 g/dL (ref 0.8–1.7)
Total Protein: 6.9 g/dL (ref 6.1–8.1)

## 2017-05-18 LAB — CK: Total CK: 96 U/L (ref 29–143)

## 2017-05-18 LAB — SEDIMENTATION RATE: SED RATE: 28 mm/h (ref 0–30)

## 2017-05-18 LAB — COMPLETE METABOLIC PANEL WITH GFR
AG Ratio: 1.7 (calc) (ref 1.0–2.5)
ALBUMIN MSPROF: 4.3 g/dL (ref 3.6–5.1)
ALT: 18 U/L (ref 6–29)
AST: 17 U/L (ref 10–35)
Alkaline phosphatase (APISO): 64 U/L (ref 33–130)
BUN / CREAT RATIO: 19 (calc) (ref 6–22)
BUN: 18 mg/dL (ref 7–25)
CALCIUM: 10.1 mg/dL (ref 8.6–10.4)
CO2: 30 mmol/L (ref 20–32)
CREATININE: 0.95 mg/dL — AB (ref 0.60–0.93)
Chloride: 102 mmol/L (ref 98–110)
GFR, EST AFRICAN AMERICAN: 69 mL/min/{1.73_m2} (ref >=60)
GFR, EST NON AFRICAN AMERICAN: 60 mL/min/{1.73_m2} (ref >=60)
Globulin: 2.5 g/dL (calc) (ref 1.9–3.7)
Glucose, Bld: 95 mg/dL (ref 65–99)
Potassium: 4.1 mmol/L (ref 3.5–5.3)
SODIUM: 141 mmol/L (ref 135–146)
TOTAL PROTEIN: 6.8 g/dL (ref 6.1–8.1)
Total Bilirubin: 0.4 mg/dL (ref 0.2–1.2)

## 2017-05-18 LAB — IFE INTERPRETATION: Immunofix Electr Int: NOT DETECTED

## 2017-05-18 LAB — VITAMIN D 25 HYDROXY (VIT D DEFICIENCY, FRACTURES): VIT D 25 HYDROXY: 42 ng/mL (ref 30–100)

## 2017-06-25 NOTE — Progress Notes (Signed)
Office Visit Note  Patient: Miranda Romero             Date of Birth: February 01, 1946           MRN: [id number]             PCP: Gaspar Garbe, MD Referring: Gaspar Garbe, MD Visit Date: 07/07/2017 Occupation: @GUAROCC @    Subjective:  Generalized pain.   History of Present Illness: Miranda F Whitefield is a 72 y.o. female with history of fibromyalgia syndrome and osteoarthritis.  She states she has been having increased pain from fibromyalgia.  She also continues to have some discomfort from underlying osteoarthritis.  She states she is was [facility] which has been very helpful.  She recently had a fall where she landed up on her left side and had some bruising on her left knee and also discomfort in her left rib cage.  She is been having increased pain in her rib cage.  She denies any shortness of breath.  Lateral total knee replacement is doing well.  She has seen Dr. Lucia Gaskins for evaluation as well.  She is scheduled to have sleep study later.  The neurologist has a scheduled MRI of her C-spine.  Activities of Daily Living:  Patient reports morning stiffness for 1 hour.   Patient Reports nocturnal pain.  Difficulty dressing/grooming: Denies Difficulty climbing stairs: Reports Difficulty getting out of chair: Reports Difficulty using hands for taps, buttons, cutlery, and/or writing: Denies   Review of Systems  Constitutional: Positive for fatigue. Negative for night sweats, weight gain and weight loss.  HENT: Negative for mouth sores, trouble swallowing, trouble swallowing, mouth dryness and nose dryness.   Eyes: Negative for pain, redness, visual disturbance and dryness.  Respiratory: Negative for cough, shortness of breath and difficulty breathing.   Cardiovascular: Positive for hypertension. Negative for chest pain, palpitations, irregular heartbeat and swelling in legs/feet.  Gastrointestinal: Negative for blood in stool,  constipation and diarrhea.  Endocrine: Negative for increased urination.  Genitourinary: Negative for vaginal dryness.  Musculoskeletal: Positive for arthralgias, joint pain, myalgias, morning stiffness and myalgias. Negative for joint swelling, muscle weakness and muscle tenderness.  Skin: Negative for color change, rash, hair loss, skin tightness, ulcers and sensitivity to sunlight.  Allergic/Immunologic: Negative for susceptible to infections.  Neurological: Negative for dizziness, memory loss, night sweats and weakness.  Hematological: Negative for swollen glands.  Psychiatric/Behavioral: Positive for sleep disturbance. Negative for depressed mood. The patient is not nervous/anxious.     PMFS History:  Patient Active Problem List   Diagnosis Date Noted  . Primary osteoarthritis of both hips 09/25/2016  . Fibromyalgia 04/28/2016  . Primary osteoarthritis of both knees 04/28/2016  . History of total knee replacement, bilateral 04/28/2016  . Primary osteoarthritis of both hands 04/28/2016  . Primary insomnia 04/28/2016  . History of sleep apnea 04/28/2016  . History of restless legs syndrome 04/28/2016  . Osteopenia of multiple sites 04/28/2016  . Hiatal hernia with gastroesophageal reflux 11/06/2015  . Incisional hernia 10/16/2010  . HYPERTENSION 05/14/2010  . OBSTRUCTIVE SLEEP APNEA 05/14/2010    Past Medical History:  Diagnosis Date  . Arthritis   . Blood transfusion   . Fibromyalgia    Dr Corliss Skains  . GERD (gastroesophageal reflux disease)   . Hiatal hernia with gastroesophageal reflux 11/06/2015  . History of hiatal hernia   . Hyperlipidemia   . Hypertension    Dr Wylene Simmer LOV 4/13 on chart  .  Seasonal allergies   . Sleep apnea    SEVERE  per sleep study 3/12 EPIC no CPAP   . Transfusion history    3 yrs ago s/p surgery for knee  . Umbilical hernia   . Weight decrease     Family History  Problem Relation Age of Onset  . Lymphoma Mother    Past Surgical History:   Procedure Laterality Date  . ANKLE SURGERY    . APPENDECTOMY  2014  . BALLOON DILATION N/A 06/05/2015   Procedure: BALLOON DILATION;  Surgeon: Willis ModenaWilliam Outlaw, MD;  Location: [facility];  Service: Endoscopy;  Laterality: N/A;  . CATARACT EXTRACTION Bilateral 05/2013  . CHOLECYSTECTOMY    . DILATION AND CURETTAGE OF UTERUS    . ESOPHAGOGASTRODUODENOSCOPY (EGD) WITH PROPOFOL N/A 06/05/2015   Procedure: ESOPHAGOGASTRODUODENOSCOPY (EGD) WITH PROPOFOL;  Surgeon: Willis ModenaWilliam Outlaw, MD;  Location: [facility];  Service: Endoscopy;  Laterality: N/A;  . HERNIA REPAIR  05/2013   due to appendectomy with mesh  . HIATAL HERNIA REPAIR N/A 11/06/2015   Procedure: LAPAROSCOPIC REPAIR HIATAL HERNIA, Nissen FUNDOPLICATION;  Surgeon: Claud KelpHaywood Ingram, MD;  Location: [facility];  Service: General;  Laterality: N/A;  . JOINT REPLACEMENT    . KNEE ARTHROPLASTY     bilateral  . knee infection     right knee hardware removed 11/12/   replaced 1/13  . VENTRAL HERNIA REPAIR  07/21/2011   Procedure: LAPAROSCOPIC VENTRAL HERNIA;  Surgeon: Ernestene MentionHaywood M Ingram, MD;  Location: [facility];  Service: General;  Laterality: N/A;  Laparoscopic Repair Verntal Incisional Hernia and Mesh   Social History   Social History Narrative   Lives at home with her husband   [place] second [place] in [place] where they spend a lot of time   Right handed   Caffeine: 2 cups daily     Objective: Vital Signs: BP 131/88 (BP Location: Right Arm, Patient Position: Sitting, Cuff Size: Normal)   Pulse 91   Resp 17   Ht 5\' 5"  (1.651 m)   Wt 220 lb (99.8 kg) Comment: per patient  LMP 09/20/2000 (Exact Date)   BMI 36.61 kg/m    Physical Exam  Constitutional: She is oriented to person, place, and time. She appears well-developed and well-nourished.  HENT:  Head: Normocephalic and atraumatic.  Eyes: Conjunctivae and EOM are normal.  Neck: Normal range of motion.  Cardiovascular: Normal rate, regular rhythm, normal heart sounds and intact distal pulses.   Pulmonary/Chest: Effort normal and breath sounds normal.  Abdominal: Soft. Bowel sounds are normal.  Musculoskeletal: She exhibits edema.  Lymphadenopathy:    She has no cervical adenopathy.  Neurological: She is alert and oriented to person, place, and time.  Skin: Skin is warm and dry. Capillary refill takes less than 2 seconds.  Psychiatric: She has a normal mood and affect. Her behavior is normal.  Nursing note and vitals reviewed.    Musculoskeletal Exam: C-spine thoracic lumbar spine limited range of motion.  Shoulder joints of the transverse joints MCPs PIPs DIPs but good range of motion.  She has some DIP PIP thickening in her hands consistent with osteoarthritis.  She has bilateral total knee replacement which appears to be doing well.  She has limitation of range of motion of her left hip joint.  Fibromyalgia tender points are positive.  She also has tenderness in the left rib cage area.  CDAI Exam: No CDAI exam completed.    Investigation: No additional findings. CBC Latest Ref Rng & Units 05/12/2017  11/08/2015 11/07/2015  WBC 3.8 - 10.8 Thousand/uL 6.9 11.8(H) 19.1(H)  Hemoglobin 11.7 - 15.5 g/dL 16.1 11.4(L) 11.6(L)  Hematocrit 35.0 - 45.0 % 39.4 35.4(L) 35.4(L)  Platelets 140 - 400 Thousand/uL 345 290 328   CMP Latest Ref Rng & Units 05/12/2017 05/12/2017 11/08/2015  Glucose 65 - 99 mg/dL 95 - 096(E)  BUN 7 - 25 mg/dL 18 - 11  Creatinine 4.54 - 0.93 mg/dL 0.98(J) - 1.91  Sodium 135 - 146 mmol/L 141 - 140  Potassium 3.5 - 5.3 mmol/L 4.1 - 4.0  Chloride 98 - 110 mmol/L 102 - 104  CO2 20 - 32 mmol/L 30 - 30  Calcium 8.6 - 10.4 mg/dL 47.8 - 8.6(L)  Total Protein 6.1 - 8.1 g/dL 6.8 6.9 6.9  Total Bilirubin 0.2 - 1.2 mg/dL 0.4 - 0.9  Alkaline Phos 38 - 126 U/L - - 57  AST 10 - 35 U/L 17 - 168(H)  ALT 6 - 29 U/L 18 - 231(H)    Imaging: No results found.  Speciality Comments: No specialty comments available.    Procedures:  No procedures performed Allergies:  Sudafed [pseudoephedrine hcl]   Assessment / Plan:     Visit Diagnoses: Fibromyalgia -she continues to have some generalized pain and discomfort.  She states her pain has increased since her recent fall.  Gabapentin, cymbalta, tramadol UDS: 1/15/2019Narc agreement: 04/05/2017 need for regular exercise was discussed.  I have also encouraged her to try water aerobics.  Primary osteoarthritis of both hands-joint protection muscle strengthening was discussed.  Primary osteoarthritis of both hips-she has limited range of motion of her hip joints.  Trochanteric bursitis of both hips-improved after physical therapy.  History of total knee replacement, bilateral-doing well.  Primary insomnia-persists  Osteopenia of multiple sites - January 2018 DXA she is on calcium and vitamin D.  History of gastroesophageal reflux (GERD)  History of restless legs syndrome  History of sleep apnea-she is getting further evaluation by Dr. Daisy Blossom.  History of hypertension   Frequent falls-I advised her to schedule an appointment with her PCP.  Orders: No orders of the defined types were placed in this encounter.  No orders of the defined types were placed in this encounter.   Face-to-face time spent with patient was 30 minutes.  Greater than 50% of time was spent in counseling and coordination of care.  Follow-Up Instructions: Return in about 6 months (around 01/06/2018) for FMS OA .   Pollyann Savoy, MD  Note - This record has been created using Animal nutritionist.  Chart creation errors have been sought, but may not always  have been located. Such creation errors do not reflect on  the standard of medical care.

## 2017-06-30 ENCOUNTER — Encounter: Payer: Self-pay | Admitting: Neurology

## 2017-06-30 ENCOUNTER — Ambulatory Visit: Payer: Medicare Other | Admitting: Neurology

## 2017-06-30 ENCOUNTER — Telehealth: Payer: Self-pay | Admitting: Neurology

## 2017-06-30 VITALS — BP 127/82 | HR 91 | Ht 65.0 in | Wt 220.0 lb

## 2017-06-30 DIAGNOSIS — G4733 Obstructive sleep apnea (adult) (pediatric): Secondary | ICD-10-CM | POA: Diagnosis not present

## 2017-06-30 DIAGNOSIS — R2689 Other abnormalities of gait and mobility: Secondary | ICD-10-CM

## 2017-06-30 DIAGNOSIS — G3281 Cerebellar ataxia in diseases classified elsewhere: Secondary | ICD-10-CM | POA: Diagnosis not present

## 2017-06-30 DIAGNOSIS — R270 Ataxia, unspecified: Secondary | ICD-10-CM

## 2017-06-30 DIAGNOSIS — R202 Paresthesia of skin: Secondary | ICD-10-CM | POA: Diagnosis not present

## 2017-06-30 DIAGNOSIS — W19XXXA Unspecified fall, initial encounter: Secondary | ICD-10-CM

## 2017-06-30 NOTE — Patient Instructions (Addendum)
MRI brain and cervical spine Labs EMG/NCS Sleep evaluation  Electromyoneurogram Electromyoneurogram is a test to check how well your muscles and nerves are working. This procedure includes the combined use of electromyogram (EMG) and nerve conduction study (NCS). EMG is used to look for muscular disorders. NCS, which is also called electroneurogram, measures how well your nerves are controlling your muscles. The procedures are usually performed together to check if your muscles and nerves are healthy. If the reaction to testing is abnormal, this can indicate disease or injury, such as peripheral nerve damage. Tell a health care provider about:  Any allergies you have.  All medicines you are taking, including vitamins, herbs, eye drops, creams, and over-the-counter medicines.  Any problems you or family members have had with anesthetic medicines.  Any blood disorders you have.  Any surgeries you have had.  Any medical conditions you have.  Any pacemaker you have. What are the risks? Generally, this is a safe procedure. However, problems may occur, including:  Infection where the electrodes were inserted.  Bleeding.  What happens before the procedure?  Ask your health care provider about: ? Changing or stopping your regular medicines. This is especially important if you are taking diabetes medicines or blood thinners. ? Taking medicines such as aspirin and ibuprofen. These medicines can thin your blood. Do not take these medicines before your procedure if your health care provider instructs you not to.  Your health care provider may ask you to avoid: ? Caffeine, such as coffee and tea. ? Nicotine. This includes cigarettes and anything with tobacco.  Do not use lotions or creams on the same day that you will be having the procedure. What happens during the procedure? For EMG:  Your health care provider will ask you to stay in a position so that he or she can access the muscle  that will be studied. You may be standing, sitting down, or lying down.  You may be given a medicine that numbs the area (local anesthetic).  A very thin needle that has an electrode on it will be inserted into your muscle.  Another small electrode will be placed on your skin near the muscle.  Your health care provider will ask you to continue to remain still.  The electrodes will send a signal that tells about the electrical activity of your muscles. You may see this on a monitor or hear it in the room.  After your muscles have been studied at rest, your health care provider will ask you to contract or flex your muscles. The electrodes will send a signal that tells about the electrical activity of your muscles.  Your health care provider will remove the electrodes and the electrode needles when the procedure is finished. The procedure may vary among health care providers and hospitals. For NCS:  An electrode that records your nerve activity (recording electrode) will be placed on your skin by the muscle that is being studied.  An electrode that is used as a reference (reference electrode) will be placed near the recording electrode.  A paste or gel will be applied to your skin between the recording electrode and the reference electrode.  Your nerve will be stimulated with a mild shock. Your health care provider will measure how much time it takes for your muscle to react.  Your health care provider will remove the electrodes and the gel when the procedure is finished. The procedure may vary among health care providers and hospitals. What happens after the  procedure?  It is your responsibility to obtain your test results. Ask your health care provider or the department performing the test when and how you will get your results.  Your health care provider may: ? Give you medicines for any pain. ? Monitor the insertion sites to make sure that they stop bleeding. This information is  not intended to replace advice given to you by your health care provider. Make sure you discuss any questions you have with your health care provider. Document Released: 07/03/2004 Document Revised: 08/08/2015 Document Reviewed: 04/23/2014 Elsevier Interactive Patient Education  2018 Elsevier Inc.   Carpal Tunnel Syndrome Carpal tunnel syndrome is a condition that causes pain in your hand and arm. The carpal tunnel is a narrow area located on the palm side of your wrist. Repeated wrist motion or certain diseases may cause swelling within the tunnel. This swelling pinches the main nerve in the wrist (median nerve). What are the causes? This condition may be caused by: Repeated wrist motions. Wrist injuries. Arthritis. A cyst or tumor in the carpal tunnel. Fluid buildup during pregnancy.  Sometimes the cause of this condition is not known. What increases the risk? This condition is more likely to develop in: People who have jobs that cause them to repeatedly move their wrists in the same motion, such as Health visitor. Women. People with certain conditions, such as: Diabetes. Obesity. An underactive thyroid (hypothyroidism). Kidney failure.  What are the signs or symptoms? Symptoms of this condition include: A tingling feeling in your fingers, especially in your thumb, index, and middle fingers. Tingling or numbness in your hand. An aching feeling in your entire arm, especially when your wrist and elbow are bent for long periods of time. Wrist pain that goes up your arm to your shoulder. Pain that goes down into your palm or fingers. A weak feeling in your hands. You may have trouble grabbing and holding items.  Your symptoms may feel worse during the night. How is this diagnosed? This condition is diagnosed with a medical history and physical exam. You may also have tests, including: An electromyogram (EMG). This test measures electrical signals sent by your nerves into  the muscles. X-rays.  How is this treated? Treatment for this condition includes: Lifestyle changes. It is important to stop doing or modify the activity that caused your condition. Physical or occupational therapy. Medicines for pain and inflammation. This may include medicine that is injected into your wrist. A wrist splint. Surgery.  Follow these instructions at home: If you have a splint: Wear it as told by your health care provider. Remove it only as told by your health care provider. Loosen the splint if your fingers become numb and tingle, or if they turn cold and blue. Keep the splint clean and dry. General instructions Take over-the-counter and prescription medicines only as told by your health care provider. Rest your wrist from any activity that may be causing your pain. If your condition is work related, talk to your employer about changes that can be made, such as getting a wrist pad to use while typing. If directed, apply ice to the painful area: Put ice in a plastic bag. Place a towel between your skin and the bag. Leave the ice on for 20 minutes, 2-3 times per day. Keep all follow-up visits as told by your health care provider. This is important. Do any exercises as told by your health care provider, physical therapist, or occupational therapist. Contact a health care provider  if: You have new symptoms. Your pain is not controlled with medicines. Your symptoms get worse. This information is not intended to replace advice given to you by your health care provider. Make sure you discuss any questions you have with your health care provider. Document Released: 02/28/2000 Document Revised: 07/11/2015 Document Reviewed: 07/18/2014 Elsevier Interactive Patient Education  [employer]2018 Elsevier Inc.

## 2017-06-30 NOTE — Telephone Encounter (Signed)
[facility] No auth they will contact the pt to schedule.

## 2017-06-30 NOTE — Progress Notes (Signed)
[facility]    Provider:  Dr Lucia Gaskins Referring Provider: Tisovec, Adelfa Koh, MD, Clementeen Hoof Primary Care Physician:  Gaspar Garbe, MD  CC: Balance and pain  HPI:  Miranda Romero is a 72 y.o. female here as a referral from Dr. Wylene Simmer for balance ane paresthesias.  Past medical history fibromyalgia, severe sleep apnea no CPAP, hypertension, osteopenia, osteoarthritis, restless leg syndrome, insomnia.  Patient follows with rheumatology. [facility] and for Fibromyalgia and multiple symptoms. Her balance is improving since physical therapy. She sees Dr. Titus Dubin for her pain levels. She has pain in her arms and legs and burning. Worsened 2 years ago but has had symptoms for years. Years ago 71 AHI and severe PLMS. She is fatigued and takes naps sometimes. She wakes up with burning pain that radiates into her arms and legs mostly in the calfs. And pain in the joints. Her hands get stiff.   Reviewed notes, labs and imaging from outside physicians, which showed:  This is a 72 year old patient who was referred for nerve pain and balance.  She has burning pain everywhere her arms and all the way down her legs.  She has issues with balance and she is fallen.  She tries to wait a few seconds before moving after she stands.  She is working with [facility] for her balance.  She has fibromyalgias that she is used to pain however this is more than she is ever had.  Patient has had extensive workup including CK, vitamin D, hemoglobin A1c, sed rate, SPEP, CMP, CBC and IFE.  She apparently is in pain management as I see labs for tramadol and pain management profiles. She sees Dr. Corliss Skains in Rheumatology.   CK normal Sed rate normal BUN 18 and creatinine 0.95 hgba1c 5.7  Review of Systems: Patient complains of symptoms per HPI as well as the following symptoms: joint pain, aching muscles. Pertinent negatives and positives per  HPI. All others negative.   Social History   Socioeconomic History  . Marital status: Married    Spouse name: Not on file  . Number of children: 2  . Years of education: Not on file  . Highest education level: Master's degree (e.g., MA, MS, MEng, MEd, MSW, MBA)  Occupational History  . Occupation: Doctor, general practice Retired  Engineer, production  . Financial resource strain: Not on file  . Food insecurity:    Worry: Not on file    Inability: Not on file  . Transportation needs:    Medical: Not on file    Non-medical: Not on file  Tobacco Use  . Smoking status: Never Smoker  . Smokeless tobacco: Never Used  Substance and Sexual Activity  . Alcohol use: Yes    Comment: socially  . Drug use: No  . Sexual activity: Yes    Partners: Male    Birth control/protection: Post-menopausal  Lifestyle  . Physical activity:    Days per week: Not on file    Minutes per session: Not on file  . Stress: Not on file  Relationships  . Social connections:    Talks on phone: Not on file    Gets together: Not on file    Attends religious service: Not on file    Active member of club or organization: Not on file    Attends meetings of clubs or organizations: Not on file    Relationship status: Not on file  . Intimate partner violence:  Fear of current or ex partner: Not on file    Emotionally abused: Not on file    Physically abused: Not on file    Forced sexual activity: Not on file  Other Topics Concern  . Not on file  Social History Narrative   Lives at home with her husband   They have second home in [place] where they spend a lot of time   Right handed   Caffeine: 2 cups daily    Family History  Problem Relation Age of Onset  . Lymphoma Mother     Past Medical History:  Diagnosis Date  . Arthritis   . Blood transfusion   . Fibromyalgia    Dr Corliss Skains  . GERD (gastroesophageal reflux disease)   . Hiatal hernia with gastroesophageal reflux 11/06/2015  . History of hiatal  hernia   . Hyperlipidemia   . Hypertension    Dr Wylene Simmer LOV 4/13 on chart  . Seasonal allergies   . Sleep apnea    SEVERE  per sleep study 3/12 EPIC no CPAP   . Transfusion history    3 yrs ago s/p surgery for knee  . Umbilical hernia   . Weight decrease     Past Surgical History:  Procedure Laterality Date  . ANKLE SURGERY    . APPENDECTOMY  2014  . BALLOON DILATION N/A 06/05/2015   Procedure: BALLOON DILATION;  Surgeon: Willis Modena, MD;  Location: [facility];  Service: Endoscopy;  Laterality: N/A;  . CATARACT EXTRACTION Bilateral 05/2013  . CHOLECYSTECTOMY    . DILATION AND CURETTAGE OF UTERUS    . ESOPHAGOGASTRODUODENOSCOPY (EGD) WITH PROPOFOL N/A 06/05/2015   Procedure: ESOPHAGOGASTRODUODENOSCOPY (EGD) WITH PROPOFOL;  Surgeon: Willis Modena, MD;  Location: [facility];  Service: Endoscopy;  Laterality: N/A;  . HERNIA REPAIR  05/2013   due to appendectomy with mesh  . HIATAL HERNIA REPAIR N/A 11/06/2015   Procedure: LAPAROSCOPIC REPAIR HIATAL HERNIA, Nissen FUNDOPLICATION;  Surgeon: Claud Kelp, MD;  Location: [facility];  Service: General;  Laterality: N/A;  . JOINT REPLACEMENT    . KNEE ARTHROPLASTY     bilateral  . knee infection     right knee hardware removed 11/12/   replaced 1/13  . VENTRAL HERNIA REPAIR  07/21/2011   Procedure: LAPAROSCOPIC VENTRAL HERNIA;  Surgeon: Ernestene Mention, MD;  Location: [facility];  Service: General;  Laterality: N/A;  Laparoscopic Repair Verntal Incisional Hernia and Mesh    Current Outpatient Medications  Medication Sig Dispense Refill  . amLODipine-atorvastatin (CADUET) 5-10 MG tablet Take 1 tablet by mouth daily.    Marland Kitchen CALCIUM PO Take 1,500 mg by mouth daily.    . cholecalciferol (VITAMIN D-400) 400 UNITS TABS Take 400 Units by mouth daily with breakfast.     . Coenzyme Q10 (CO Q-10) 200 MG CAPS Take 200 mg by mouth daily.    Marland Kitchen CRANBERRY PO Take 4 tablets by mouth daily.    . diclofenac sodium (VOLTAREN) 1 % GEL Apply 2 g topically  daily as needed (for pain).    . DULoxetine (CYMBALTA) 30 MG capsule TAKE 1 CAPSULE BY MOUTH AT  BEDTIME 90 capsule 0  . fexofenadine (ALLEGRA) 180 MG tablet Take 180 mg by mouth daily with breakfast.     . gabapentin (NEURONTIN) 300 MG capsule TAKE 1 CAPSULE BY MOUTH TWO TIMES DAILY 180 capsule 0  . ibuprofen (ADVIL,MOTRIN) 200 MG tablet Take 200 mg by mouth every 6 (six) hours as needed.    Marland Kitchen MAGNESIUM  PO Take 1,000 mg by mouth 2 (two) times daily.    . Manganese 50 MG TABS Take 50 mg by mouth daily.    . Probiotic Product (PROBIOTIC DAILY) CAPS Take 1 tablet by mouth daily.    . Psyllium (METAMUCIL FIBER PO) Take 10 tablets by mouth daily.     Marland Kitchen senna (SENOKOT) 8.6 MG tablet Take 4 tablets by mouth daily.     Laurann Montana (KERYDIN) 5 % SOLN Apply 1 application topically daily. Apply to toes    . Thiamine HCl (VITAMIN B-1) 100 MG tablet Take 100 mg by mouth daily with breakfast.     . traMADol (ULTRAM) 50 MG tablet Take 1 tablet (50 mg total) by mouth 2 (two) times daily as needed. 60 tablet 0   No current facility-administered medications for this visit.     Allergies as of 06/30/2017 - Review Complete 06/30/2017  Allergen Reaction Noted  . Sudafed [pseudoephedrine hcl] Other (See Comments) 05/07/2010    Vitals: BP 127/82 (BP Location: Left Arm, Patient Position: Sitting)   Pulse 91   Ht 5\' 5"  (1.651 m)   Wt 220 lb (99.8 kg)   LMP 09/20/2000 (Exact Date)   BMI 36.61 kg/m  Last Weight:  Wt Readings from Last 1 Encounters:  06/30/17 220 lb (99.8 kg)   Last Height:   Ht Readings from Last 1 Encounters:  06/30/17 5\' 5"  (1.651 m)    Physical exam: Exam: Gen: NAD, conversant, well nourised, obese, well groomed                     CV: RRR, no MRG. No Carotid Bruits. + minimal peripheral edema ankles, warm, nontender Eyes: Conjunctivae clear without exudates or hemorrhage  Neuro: Detailed Neurologic Exam  Speech:    Speech is normal; fluent and spontaneous with normal  comprehension.  Cognition:    The patient is oriented to person, place, and time;     recent and remote memory intact;     language fluent;     normal attention, concentration,     fund of knowledge Cranial Nerves:    The pupils are equal, round, and reactive to light. The fundi are normal and spontaneous venous pulsations are present. Visual fields are full to finger confrontation. Extraocular movements are intact. Trigeminal sensation is intact and the muscles of mastication are normal. The face is symmetric. The palate elevates in the midline. Hearing intact. Voice is normal. Shoulder shrug is normal. The tongue has normal motion without fasciculations.   Coordination:    Normal finger to nose  Gait:    Heel-toe intact with imbalance, antalgic  Motor Observation:    No asymmetry, no atrophy, and no involuntary movements noted. Tone:    Normal muscle tone.    Posture:    Posture is normal. normal erect    Strength: Left leg prox weakness, giveway due to pain. Otherwise strength is V/V in the upper and lower limbs.      Sensation: intact to LT, pin prick distally     Reflex Exam:  DTR's:    Deep tendon reflexes in the upper and lower extremities are normal bilaterally.   Toes:    The toes are equiv bilaterally.   Clonus:    Clonus is absent.    + Phalen's maneuver at the wrists   Assessment/Plan:   72 y.o. female here as a referral from Dr. Wylene Simmer for balance ane paresthesias.  Past medical history fibromyalgia, severe sleep apnea  no CPAP, hypertension, osteopenia, osteoarthritis, restless leg syndrome, insomnia.  Patient follows with rheumatology.  Patient per medical history has severe sleep apnea but is not on CPAP.  Discussed serious sequelae such as stroke, cardiovascular disease and others as well as headaches and fatigue.  Emg/ncs of the bilateral uppers and LEFT leg MRI brain and cervical spine due to falls, ataxia, paresthesias, left leg weakness eval for  strokes, lesions, MS, stenosis and cervical myelopathy Labs today Sleep test due to AHI was 71 in the past per records. Not on cpap. Sleep referral.  CTS? Wear wrist splints at night Fall prevention, discussed   Orders Placed This Encounter  Procedures  . MR BRAIN W WO CONTRAST  . MR CERVICAL SPINE WO CONTRAST  . TSH  . B12 and Folate Panel  . Heavy metals, blood  . Vitamin B6  . Methylmalonic acid, serum  . Vitamin B1  . Ambulatory referral to Sleep Studies  . NCV with EMG(electromyography)      Naomie DeanAntonia Bernise Sylvain, MD  [facility] [address] [place], [place] [id number]  Phone [phone] Fax [phone]

## 2017-07-05 ENCOUNTER — Telehealth: Payer: Self-pay | Admitting: Neurology

## 2017-07-05 LAB — VITAMIN B6: VITAMIN B6: 44.2 ug/L — AB (ref 2.0–32.8)

## 2017-07-05 LAB — VITAMIN B1: Thiamine: 265.8 nmol/L — ABNORMAL HIGH (ref 66.5–200.0)

## 2017-07-05 LAB — METHYLMALONIC ACID, SERUM: Methylmalonic Acid: 196 nmol/L (ref 0–378)

## 2017-07-05 LAB — HEAVY METALS, BLOOD
ARSENIC: 6 ug/L (ref 2–23)
LEAD, BLOOD: NOT DETECTED ug/dL (ref 0–4)
MERCURY: NOT DETECTED ug/L (ref 0.0–14.9)

## 2017-07-05 LAB — B12 AND FOLATE PANEL: Vitamin B-12: 888 pg/mL (ref 232–1245)

## 2017-07-05 LAB — TSH: TSH: 3.16 u[IU]/mL (ref 0.450–4.500)

## 2017-07-05 NOTE — Telephone Encounter (Signed)
Called the pt and informed of the normal lab work. Pt verbalized understanding. Pt had no questions at this time but was encouraged to call back if questions arise.

## 2017-07-05 NOTE — Telephone Encounter (Signed)
-----   Message from Anson FretAntonia B Ahern, MD sent at 07/05/2017 11:36 AM EDT ----- Labs unremarkable thanks

## 2017-07-07 ENCOUNTER — Ambulatory Visit: Payer: Medicare Other | Admitting: Rheumatology

## 2017-07-07 ENCOUNTER — Encounter: Payer: Self-pay | Admitting: Rheumatology

## 2017-07-07 VITALS — BP 131/88 | HR 91 | Resp 17 | Ht 65.0 in | Wt 220.0 lb

## 2017-07-07 DIAGNOSIS — M797 Fibromyalgia: Secondary | ICD-10-CM

## 2017-07-07 DIAGNOSIS — F5101 Primary insomnia: Secondary | ICD-10-CM | POA: Diagnosis not present

## 2017-07-07 DIAGNOSIS — Z8679 Personal history of other diseases of the circulatory system: Secondary | ICD-10-CM

## 2017-07-07 DIAGNOSIS — M19042 Primary osteoarthritis, left hand: Secondary | ICD-10-CM

## 2017-07-07 DIAGNOSIS — Z96653 Presence of artificial knee joint, bilateral: Secondary | ICD-10-CM

## 2017-07-07 DIAGNOSIS — M7061 Trochanteric bursitis, right hip: Secondary | ICD-10-CM | POA: Diagnosis not present

## 2017-07-07 DIAGNOSIS — M160 Bilateral primary osteoarthritis of hip: Secondary | ICD-10-CM

## 2017-07-07 DIAGNOSIS — Z8719 Personal history of other diseases of the digestive system: Secondary | ICD-10-CM

## 2017-07-07 DIAGNOSIS — M19041 Primary osteoarthritis, right hand: Secondary | ICD-10-CM | POA: Diagnosis not present

## 2017-07-07 DIAGNOSIS — M7062 Trochanteric bursitis, left hip: Secondary | ICD-10-CM | POA: Diagnosis not present

## 2017-07-07 DIAGNOSIS — M8589 Other specified disorders of bone density and structure, multiple sites: Secondary | ICD-10-CM

## 2017-07-07 DIAGNOSIS — Z8669 Personal history of other diseases of the nervous system and sense organs: Secondary | ICD-10-CM

## 2017-07-09 ENCOUNTER — Ambulatory Visit
Admission: RE | Admit: 2017-07-09 | Discharge: 2017-07-09 | Disposition: A | Discharge status: Home / Self Care | Payer: Medicare Other | Source: Ambulatory Visit | Attending: Neurology | Admitting: Neurology

## 2017-07-09 DIAGNOSIS — G3281 Cerebellar ataxia in diseases classified elsewhere: Secondary | ICD-10-CM | POA: Diagnosis not present

## 2017-07-09 MED ORDER — GADOBENATE DIMEGLUMINE 529 MG/ML IV SOLN
20.0000 mL | Freq: Once | INTRAVENOUS | Status: AC | PRN
  Administered 2017-07-09: 20 mL via INTRAVENOUS

## 2017-07-15 ENCOUNTER — Telehealth: Payer: Self-pay | Admitting: *Deleted

## 2017-07-15 DIAGNOSIS — M4802 Spinal stenosis, cervical region: Secondary | ICD-10-CM

## 2017-07-15 NOTE — Telephone Encounter (Addendum)
Called pt on 07/14/17 and LVM asking for call back.   ----- Message from Anson Fret, MD sent at 07/13/2017 10:18 AM EDT ----- Patient's cervical spine shows moderate central canal stenosis due to degenerative/arthritic disease. The cervical cord looks fine however and so I am not concerned that we need to do anything right away and this is not emergent -  but I do think she needs to be evaluated and followed by neurosurgery in case of worsening. If she is amenable would like to refer her to [facility] thanks.  Result Notes for MR BRAIN W WO CONTRAST   Notes recorded by Anson Fret, MD on 07/13/2017 at 10:13 AM EDT MRI brain normal for age thanks

## 2017-07-20 ENCOUNTER — Other Ambulatory Visit: Payer: Self-pay | Admitting: Rheumatology

## 2017-07-20 NOTE — Telephone Encounter (Signed)
Last Visit: 07/07/17 Next Visit: 04/25/18  Okay to refill per Dr. Deveshwar 

## 2017-07-21 NOTE — Telephone Encounter (Signed)
Please place referral to [facility] for evaluation of cervical stenosis thanks

## 2017-07-21 NOTE — Telephone Encounter (Signed)
Done

## 2017-07-21 NOTE — Addendum Note (Signed)
Addended by: Bertram Savin on: 07/21/2017 01:53 PM   Modules accepted: Orders

## 2017-07-21 NOTE — Telephone Encounter (Signed)
Spoke with patient. She is aware that her MRI brain is normal for her age. She was also made aware that cervical spine shows moderate central canal stenosis due to degenerative/arthritic disease. The cervical cord looks fine however and so Dr. Lucia Gaskins is not concerned that we need to do anything right away and this is not emergent -  but Dr. Lucia Gaskins does think she needs to be evaluated and followed by neurosurgery in case of worsening. If she is amenable would like to refer her to [facility]. The patient verbalized understanding and agrees to referral to neurosurgery.

## 2017-08-18 ENCOUNTER — Telehealth: Payer: Self-pay | Admitting: Neurology

## 2017-08-18 NOTE — Telephone Encounter (Signed)
Patient is scheduled with Dr. Franky Machoabbell on 10/19/2017 arrive at 10:30 for 11:00 apt. Patient requested an  August apt.

## 2017-09-26 ENCOUNTER — Other Ambulatory Visit: Payer: Self-pay | Admitting: Physician Assistant

## 2017-09-27 MED ORDER — TRAMADOL HCL 50 MG PO TABS: 50.0000 mg | ORAL_TABLET | Freq: Two times a day (BID) | ORAL | 0 refills | Status: DC | PRN

## 2017-09-27 NOTE — Telephone Encounter (Signed)
Last Visit: 07/07/17 Next Visit: 04/25/18 UDS: 03/30/17 Narcotic Agreement: 04/05/17  Okay to refill Tramadol?

## 2017-10-18 ENCOUNTER — Ambulatory Visit: Payer: Medicare Other | Admitting: Neurology

## 2017-10-18 ENCOUNTER — Encounter: Payer: Self-pay | Admitting: Neurology

## 2017-10-18 VITALS — BP 130/85 | HR 80 | Ht 65.0 in | Wt 219.0 lb

## 2017-10-18 DIAGNOSIS — G4733 Obstructive sleep apnea (adult) (pediatric): Secondary | ICD-10-CM | POA: Diagnosis not present

## 2017-10-18 DIAGNOSIS — R351 Nocturia: Secondary | ICD-10-CM

## 2017-10-18 DIAGNOSIS — Z789 Other specified health status: Secondary | ICD-10-CM

## 2017-10-18 DIAGNOSIS — G4761 Periodic limb movement disorder: Secondary | ICD-10-CM

## 2017-10-18 NOTE — Progress Notes (Signed)
Subjective:    Patient ID: Miranda Romero is a 72 y.o. female.  HPI     Huston Foley, MD, PhD [facility] [address] [address] [place], [place] [address]  Dear Desma Maxim,   I saw your patient, Miranda Basnett, upon your kind request in my clinic today for initial consultation of her sleep disorder, in particular, re-evaluation of her prior diagnosis of obstructive sleep apnea. The patient is accompanied by her husband today. As you know, Ms. Stankovic is a 72 year old right-handed woman with an underlying medical history of arthritis, fibromyalgia, reflux disease, hypertension, hyperlipidemia, seasonal allergies, status post knee replacement surgeries and obesity, who was previously diagnosed with obstructive sleep apnea and placed on CPAP therapy. She no longer has a CPAP machine. I reviewed your office note from 06/30/2017. Sleep study testing from 2012 showed severe sleep apnea. She had a CPAP titration study on 05/28/2010 which showed reduction of her AHI on CPAP of 7 cm but she had desaturations for which she was placed on supplemental oxygen at 1 L per minute. She has never used O2 at home. She does not recall feeling better with CPAP. Has significant nocturia about 3-6 times. She goes to bed around midnight and rise time is around 8:30. She has no telltale symptoms of restless leg syndrome but was told during the sleep study that she ran all night and her husband does report that she twitches in her sleep. She is significantly bothered by her left hip. She is status post bilateral knee replacement surgeries and has had severe left hip pain for which she has undergone injection treatment. She may be facing hip replacement surgery. They have another home in [place]. She is hoping to pursue hip surgery first and would be willing to get retested for sleep apnea after she has been able to address her hip problem. Her husband reports that her snoring and apneas have  improved with time. She has fluctuated a little bit with her weight but not by much.  She used to be on Ambien for years. She no longer takes Ambien or any other prescription sleep aid.  Her Epworth sleepiness score is 9 out of 24 today, fatigue score is 35 out of 63. She does not smoke, drinks alcohol occasionally or rarely, drinks caffeine in the form of coffee, typically 2 cups per day on average. Less than 1 soda per day. She is a retired Doctor, general practice. Her mom was suspected to have sleep apnea but was not officially tested for it. She denies morning headaches.  Her Past Medical History Is Significant For: Past Medical History:  Diagnosis Date  . Arthritis   . Blood transfusion   . Fibromyalgia    Dr Corliss Skains  . GERD (gastroesophageal reflux disease)   . Hiatal hernia with gastroesophageal reflux 11/06/2015  . History of hiatal hernia   . Hyperlipidemia   . Hypertension    Dr Wylene Simmer LOV 4/13 on chart  . Seasonal allergies   . Sleep apnea    SEVERE  per sleep study 3/12 EPIC no CPAP   . Transfusion history    3 yrs ago s/p surgery for knee  . Umbilical hernia   . Weight decrease     Her Past Surgical History Is Significant For: Past Surgical History:  Procedure Laterality Date  . ANKLE SURGERY    . APPENDECTOMY  2014  . BALLOON DILATION N/A 06/05/2015   Procedure: BALLOON DILATION;  Surgeon: Willis Modena, MD;  [facility];  Service: Endoscopy;  Laterality: N/A;  . CATARACT EXTRACTION Bilateral 05/2013  . CHOLECYSTECTOMY    . DILATION AND CURETTAGE OF UTERUS    . ESOPHAGOGASTRODUODENOSCOPY (EGD) WITH PROPOFOL N/A 06/05/2015   Procedure: ESOPHAGOGASTRODUODENOSCOPY (EGD) WITH PROPOFOL;  Surgeon: Willis Modena, MD;  [facility];  Service: Endoscopy;  Laterality: N/A;  . HERNIA REPAIR  05/2013   due to appendectomy with mesh  . HIATAL HERNIA REPAIR N/A 11/06/2015   Procedure: LAPAROSCOPIC REPAIR HIATAL HERNIA, Nissen FUNDOPLICATION;  Surgeon: Claud Kelp, MD;  Location: [facility];  Service: General;  Laterality: N/A;  . JOINT REPLACEMENT    . KNEE ARTHROPLASTY     bilateral  . knee infection     right knee hardware removed 11/12/   replaced 1/13  . VENTRAL HERNIA REPAIR  07/21/2011   Procedure: LAPAROSCOPIC VENTRAL HERNIA;  Surgeon: Ernestene Mention, MD;  Location: [facility];  Service: General;  Laterality: N/A;  Laparoscopic Repair Verntal Incisional Hernia and Mesh    Her Family History Is Significant For: Family History  Problem Relation Age of Onset  . Lymphoma Mother     Her Social History Is Significant For: Social History   Socioeconomic History  . Marital status: Married    Spouse name: Not on file  . Number of children: 2  . Years of education: Not on file  . Highest education level: Master's degree (e.g., MA, MS, MEng, MEd, MSW, MBA)  Occupational History  . Occupation: Doctor, general practice Retired  Engineer, production  . Financial resource strain: Not on file  . Food insecurity:    Worry: Not on file    Inability: Not on file  . Transportation needs:    Medical: Not on file    Non-medical: Not on file  Tobacco Use  . Smoking status: Never Smoker  . Smokeless tobacco: Never Used  Substance and Sexual Activity  . Alcohol use: Yes    Comment: socially  . Drug use: No  . Sexual activity: Yes    Partners: Male    Birth control/protection: Post-menopausal  Lifestyle  . Physical activity:    Days per week: Not on file    Minutes per session: Not on file  . Stress: Not on file  Relationships  . Social connections:    Talks on phone: Not on file    Gets together: Not on file    Attends religious service: Not on file    Active member of club or organization: Not on file    Attends meetings of clubs or organizations: Not on file    Relationship status: Not on file  Other Topics Concern  . Not on file  Social History Narrative   Lives at home with her husband   They have second home in [place] where they spend a lot of  time   Right handed   Caffeine: 2 cups daily    Her Allergies Are:  Allergies  Allergen Reactions  . Sudafed [Pseudoephedrine Hcl] Other (See Comments)    hypertactive  :   Her Current Medications Are:  Outpatient Encounter Medications as of 10/18/2017  Medication Sig  . amLODipine-atorvastatin (CADUET) 5-10 MG tablet Take 1 tablet by mouth daily.  Marland Kitchen CALCIUM PO Take 1,500 mg by mouth daily.  . cholecalciferol (VITAMIN D-400) 400 UNITS TABS Take 400 Units by mouth daily with breakfast.   . Coenzyme Q10 (CO Q-10) 200 MG CAPS Take 200 mg by mouth daily.  Marland Kitchen CRANBERRY PO Take  4 tablets by mouth daily.  . diclofenac sodium (VOLTAREN) 1 % GEL Apply 2 g topically daily as needed (for pain).  . DULoxetine (CYMBALTA) 30 MG capsule TAKE 1 CAPSULE BY MOUTH AT  BEDTIME  . fexofenadine (ALLEGRA) 180 MG tablet Take 180 mg by mouth daily with breakfast.   . gabapentin (NEURONTIN) 300 MG capsule TAKE 1 CAPSULE BY MOUTH TWO TIMES DAILY  . ibuprofen (ADVIL,MOTRIN) 200 MG tablet Take 200 mg by mouth every 6 (six) hours as needed.  Marland Kitchen MAGNESIUM PO Take 1,000 mg by mouth 2 (two) times daily.  . Manganese 50 MG TABS Take 50 mg by mouth daily.  . Probiotic Product (PROBIOTIC DAILY) CAPS Take 1 tablet by mouth daily.  . Psyllium (METAMUCIL FIBER PO) Take 10 tablets by mouth daily.   Marland Kitchen senna (SENOKOT) 8.6 MG tablet Take 4 tablets by mouth daily.   Laurann Montana (KERYDIN) 5 % SOLN Apply 1 application topically daily. Apply to toes  . Thiamine HCl (VITAMIN B-1) 100 MG tablet Take 100 mg by mouth daily with breakfast.   . traMADol (ULTRAM) 50 MG tablet Take 1 tablet (50 mg total) by mouth 2 (two) times daily as needed.   No facility-administered encounter medications on file as of 10/18/2017.   :  Review of Systems:  Out of a complete 14 point review of systems, all are reviewed and negative with the exception of these symptoms as listed below:  Review of Systems  Neurological:       Epworth Sleepiness  Scale 0= would never doze 1= slight chance of dozing 2= moderate chance of dozing 3= high chance of dozing  Sitting and reading:1 Watching TV: 1 Sitting inactive in a public place (ex. Theater or meeting):1 As a passenger in a car for an hour without a break:3 Lying down to rest in the afternoon:3 Sitting and talking to someone:0 Sitting quietly after lunch (no alcohol):0 In a car, while stopped in traffic:0 Total:9    Objective:  Neurological Exam  Physical Exam Physical Examination:   Vitals:   10/18/17 1042  BP: 130/85  Pulse: 80   General Examination: The patient is a very pleasant 72 y.o. female in no acute distress. She appears well-developed and well-nourished and well groomed.   HEENT: Normocephalic, atraumatic, pupils are equal, round and reactive to light and accommodation. Extraocular tracking is good without limitation to gaze excursion or nystagmus noted. Normal smooth pursuit is noted. Hearing is grossly intact. Speech is clear with no dysarthria noted. There is no hypophonia. There is no lip, neck/head, jaw or voice tremor. Neck is supple with full range of passive and active motion. There are no carotid bruits on auscultation. Oropharynx exam reveals: mild mouth dryness, adequate dental hygiene and moderate airway crowding, due to smaller airway entry, smaller tonsils, small tag/soft tissue growth on uvula. Mallampati is class II. Tongue protrudes centrally and palate elevates symmetrically. Tonsils are 1+ in size. Neck size is 15.5 inches. Chest: Clear to auscultation without wheezing, rhonchi or crackles noted.  Heart: S1+S2+0, regular and normal without murmurs, rubs or gallops noted.   Abdomen: Soft, non-tender and non-distended with normal bowel sounds appreciated on auscultation.  Extremities: There is trace edema in the distal lower extremities bilaterally. Pedal pulses are intact.  Skin: Warm and dry without trophic changes noted.  Musculoskeletal:  exam reveals no obvious joint deformities, tenderness or joint swelling or erythema, L hip pain reported.  Neurologically:  Mental status: The patient is awake, alert and oriented  in all 4 spheres. Her immediate and remote memory, attention, language skills and fund of knowledge are appropriate. There is no evidence of aphasia, agnosia, apraxia or anomia. Speech is clear with normal prosody and enunciation. Thought process is linear. Mood is normal and affect is normal.  Cranial nerves II - XII are as described above under HEENT exam. In addition: shoulder shrug is normal with equal shoulder height noted. Motor exam: Normal bulk, strength and tone is noted. There is no drift, tremor or rebound. Romberg is not tested d/t safety concerns. Fine motor skills and coordination: grossly intact.  Cerebellar testing: No dysmetria or intention tremor.  Sensory exam: intact to light touch in the upper and lower extremities.  Gait, station and balance: She stands with mild difficulty. She walks with mild difficulty.  Assessment and Plan:  In summary, [place] F Theresia LoKingsley is a very pleasant 72 y.o.-year old female with an underlying medical history of arthritis, fibromyalgia, reflux disease, hypertension, hyperlipidemia, seasonal allergies, status post knee replacement surgeries and obesity, who presents for reevaluation of her prior diagnosis of obstructive sleep apnea. She was previously diagnosed with severe sleep disordered breathing, she no longer uses a CPAP machine and has never been on oxygen treatment at home. She would be reluctant to pursue sleep study testing at this time, she would be willing to consider CPAP therapy if the need arises. She reports difficulty tolerating CPAP at the time. She also reports that she did not feel better rested after using CPAP. She used it for a few years. I had a long chat with the patient and her husband about my findings and the diagnosis of OSA, its prognosis and treatment  options. We talked about medical treatments, surgical interventions and non-pharmacological approaches. I explained in particular the risks and ramifications of untreated moderate to severe OSA, especially with respect to developing cardiovascular disease down the Road, including congestive heart failure, difficult to treat hypertension, cardiac arrhythmias, or stroke. Even type 2 diabetes has, in part, been linked to untreated OSA. Symptoms of untreated OSA include daytime sleepiness, memory problems, mood irritability and mood disorder such as depression and anxiety, lack of energy, as well as recurrent headaches, especially morning headaches. We talked about trying to maintain a healthy lifestyle in general, as well as the importance of weight control. I encouraged the patient to eat healthy, exercise daily and keep well hydrated, to keep a scheduled bedtime and wake time routine, to not skip any meals and eat healthy snacks in between meals. I advised the patient not to drive when feeling sleepy. I recommended the following at this time: sleep study with potential positive airway pressure titration. As per her request, I will wait until she is ready to schedule the sleep study. She would not be unwilling to get retested and even consider CPAP therapy but for now she would like to pursue her left hip pain and treatment, potentially even in the form of hip replacement surgery. We mutually agreed to wait things out until after she has been able to address her left hip problem. She will call our office when she is ready to schedule at which time I will order a sleep study. Thank you very much for allowing me to participate in the care of this nice patient. If I can be of any further assistance to you please do not hesitate to talk to me.  Sincerely,   Huston FoleySaima Eleanore Junio, MD, PhD

## 2017-10-18 NOTE — Patient Instructions (Signed)
Thank you for choosing [facility] for your sleep related care! It was nice to meet you today! I appreciate that you entrust me with your sleep related healthcare concerns. I hope, I was able to address at least some of your concerns today, and that I can help you feel reassured and also get better.    Here is what we discussed today and what we came up with as our plan for you:    Based on your symptoms and your exam I believe you are still at risk for obstructive sleep apnea and would benefit from reevaluation as it has been many years and you need new supplies and updated machine. Therefore, I think we should proceed with a sleep study to determine how severe your sleep apnea is. If you have more than mild OSA, I want you to consider ongoing treatment with CPAP. Please remember, the risks and ramifications of moderate to severe obstructive sleep apnea or OSA are: Cardiovascular disease, including congestive heart failure, stroke, difficult to control hypertension, arrhythmias, and even type 2 diabetes has been linked to untreated OSA. Sleep apnea causes disruption of sleep and sleep deprivation in most cases, which, in turn, can cause recurrent headaches, problems with memory, mood, concentration, focus, and vigilance. Most people with untreated sleep apnea report excessive daytime sleepiness, which can affect their ability to drive. Please do not drive if you feel sleepy.   I will likely see you back after your sleep study to go over the test results and where to go from there. We will call you after your sleep study to advise about the results (most likely, you will hear from MadisonKristen, my nurse) and to set up an appointment at the time, as necessary.    Our sleep lab administrative assistant will be able to schedule your sleep study. Please call when you are ready to pursue the sleep test. I agree that you should pursue your left hip pain and treatment with ortho. Please call us at  [phone]. You can leave a message with your phone number and concerns, if you get the voicemail box. She will call back as soon as possible.

## 2017-10-21 ENCOUNTER — Ambulatory Visit (INDEPENDENT_AMBULATORY_CARE_PROVIDER_SITE_OTHER): Payer: Medicare Other | Admitting: Neurology

## 2017-10-21 ENCOUNTER — Encounter: Payer: Self-pay | Admitting: Neurology

## 2017-10-21 DIAGNOSIS — Z0289 Encounter for other administrative examinations: Secondary | ICD-10-CM

## 2017-10-21 DIAGNOSIS — R270 Ataxia, unspecified: Secondary | ICD-10-CM | POA: Diagnosis not present

## 2017-10-21 DIAGNOSIS — R202 Paresthesia of skin: Secondary | ICD-10-CM

## 2017-10-21 DIAGNOSIS — R2689 Other abnormalities of gait and mobility: Secondary | ICD-10-CM

## 2017-10-25 ENCOUNTER — Ambulatory Visit (INDEPENDENT_AMBULATORY_CARE_PROVIDER_SITE_OTHER): Payer: Medicare Other | Admitting: Orthopaedic Surgery

## 2017-10-25 ENCOUNTER — Encounter (INDEPENDENT_AMBULATORY_CARE_PROVIDER_SITE_OTHER): Payer: Self-pay | Admitting: Orthopaedic Surgery

## 2017-10-25 VITALS — BP 122/78 | HR 97 | Ht 65.0 in | Wt 211.0 lb

## 2017-10-25 DIAGNOSIS — M160 Bilateral primary osteoarthritis of hip: Secondary | ICD-10-CM

## 2017-10-25 NOTE — Progress Notes (Signed)
Office Visit Note   Patient: Miranda Romero           Date of Birth: 1945-07-25           MRN: [id number] Visit Date: 10/25/2017              Requested by: Gaspar Garbeisovec, Richard W, MD [address] [place], [place] [id number] PCP: Gaspar Garbeisovec, Richard W, MD   Assessment & Plan: Visit Diagnoses:  1. Primary osteoarthritis of both hips     Plan: Miranda Romero has reached a point of significant compromise of her daily activities relating to the osteoarthritis of her left hip.  She is ready to schedule hip replacement.  Her fibromyalgia is quite active but she is taking a combination of tramadol, Cymbalta and gabapentin.  Long discussion regarding hip surgery which supplements prior evaluations and discussions.  We will need clearance from Dr. Wylene Simmerisovec.  Discussion over nearly 30 minutes regarding diagnosis, surgery, hospitalization and what she might expect.  I think her postoperative course will be more difficult because of her fibromyalgia.  Follow-Up Instructions: Return will schedule leftr hip replacement.   Orders:  No orders of the defined types were placed in this encounter.  No orders of the defined types were placed in this encounter.     Procedures: No procedures performed   Clinical Data: No additional findings.   Subjective: Chief Complaint  Patient presents with  . Follow-up    L HIP PAIN FOR 1 YR HAD INJECTION IN MAY 2019 IN [place] BY DR Jean RosenthalJACKSON. WOULD LIKE TO DISCUSS SURGERY  Miranda Romero is accompanied by her husband and ready to schedule left total hip replacement.  [place] and realizes that over the past year she has had significant compromise of her activities related to her left hip arthritis.  She also realizes that her fibromyalgia is still quite active despite Cymbalta.  She uses a cane for ambulation. Miranda Romero is had recent films of of her pelvis and which are provided on a disc.  I reviewed these.  Has end-stage  arthritis of her left hip with decrease in the joint space and subchondral sclerosis.  HPI  Review of Systems  Constitutional: Negative for fatigue and fever.  HENT: Negative for ear pain.   Eyes: Negative for pain.  Respiratory: Negative for cough.   Cardiovascular: Negative for leg swelling.  Gastrointestinal: Positive for constipation. Negative for diarrhea.  Genitourinary: Negative for difficulty urinating.  Musculoskeletal: Negative for back pain and neck pain.  Skin: Negative for rash.  Allergic/Immunologic: Negative for food allergies.  Neurological: Positive for weakness. Negative for numbness.  Hematological: Bruises/bleeds easily.  Psychiatric/Behavioral: Positive for sleep disturbance.     Objective: Vital Signs: BP 122/78 (BP Location: Left Arm, Patient Position: Sitting, Cuff Size: Normal)   Pulse 97   Ht 5\' 5"  (1.651 m)   Wt 211 lb (95.7 kg)   LMP 09/20/2000 (Exact Date)   BMI 35.11 kg/m   Physical Exam  Constitutional: She is oriented to person, place, and time. She appears well-developed and well-nourished.  HENT:  Mouth/Throat: Oropharynx is clear and moist.  Eyes: Pupils are equal, round, and reactive to light. EOM are normal.  Pulmonary/Chest: Effort normal.  Neurological: She is alert and oriented to person, place, and time.  Skin: Skin is warm and dry.  Psychiatric: She has a normal mood and affect. Her behavior is normal.    Ortho Exam awake alert and oriented x3.  Comfortable sitting.  Uses a cane for ambulation with a distinct limp referable to her left hip.  Minimal motion with internal/external rotation of her hip.  Without problem.  Specialty Comments:  No specialty comments available.  Imaging: No results found.   PMFS History: Patient Active Problem List   Diagnosis Date Noted  . Primary osteoarthritis of both hips 09/25/2016  . Fibromyalgia 04/28/2016  . Primary osteoarthritis of both knees 04/28/2016  . History of total knee  replacement, bilateral 04/28/2016  . Primary osteoarthritis of both hands 04/28/2016  . Primary insomnia 04/28/2016  . History of sleep apnea 04/28/2016  . History of restless legs syndrome 04/28/2016  . Osteopenia of multiple sites 04/28/2016  . Hiatal hernia with gastroesophageal reflux 11/06/2015  . Incisional hernia 10/16/2010  . HYPERTENSION 05/14/2010  . OBSTRUCTIVE SLEEP APNEA 05/14/2010   Past Medical History:  Diagnosis Date  . Arthritis   . Blood transfusion   . Fibromyalgia    Dr Corliss Skainseveshwar  . GERD (gastroesophageal reflux disease)   . Hiatal hernia with gastroesophageal reflux 11/06/2015  . History of hiatal hernia   . Hyperlipidemia   . Hypertension    Dr Wylene Simmerisovec LOV 4/13 on chart  . Seasonal allergies   . Sleep apnea    SEVERE  per sleep study 3/12 EPIC no CPAP   . Transfusion history    3 yrs ago s/p surgery for knee  . Umbilical hernia   . Weight decrease     Family History  Problem Relation Age of Onset  . Lymphoma Mother     Past Surgical History:  Procedure Laterality Date  . ANKLE SURGERY    . APPENDECTOMY  2014  . BALLOON DILATION N/A 06/05/2015   Procedure: BALLOON DILATION;  Surgeon: Willis ModenaWilliam Outlaw, MD;  [facility];  Service: Endoscopy;  Laterality: N/A;  . CATARACT EXTRACTION Bilateral 05/2013  . CHOLECYSTECTOMY    . DILATION AND CURETTAGE OF UTERUS    . ESOPHAGOGASTRODUODENOSCOPY (EGD) WITH PROPOFOL N/A 06/05/2015   Procedure: ESOPHAGOGASTRODUODENOSCOPY (EGD) WITH PROPOFOL;  Surgeon: Willis ModenaWilliam Outlaw, MD;  [facility];  Service: Endoscopy;  Laterality: N/A;  . HERNIA REPAIR  05/2013   due to appendectomy with mesh  . HIATAL HERNIA REPAIR N/A 11/06/2015   Procedure: LAPAROSCOPIC REPAIR HIATAL HERNIA, Nissen FUNDOPLICATION;  Surgeon: Claud KelpHaywood Ingram, MD;  [facility];  Service: General;  Laterality: N/A;  . JOINT REPLACEMENT    . KNEE ARTHROPLASTY     bilateral  . knee infection     right knee hardware removed 11/12/    replaced 1/13  . VENTRAL HERNIA REPAIR  07/21/2011   Procedure: LAPAROSCOPIC VENTRAL HERNIA;  Surgeon: Ernestene MentionHaywood M Ingram, MD;  Location: [facility];  Service: General;  Laterality: N/A;  Laparoscopic Repair Verntal Incisional Hernia and Mesh   Social History   Occupational History  . Occupation: Doctor, general practicepeech Pathologist Retired  Tobacco Use  . Smoking status: Never Smoker  . Smokeless tobacco: Never Used  Substance and Sexual Activity  . Alcohol use: Yes    Comment: socially  . Drug use: No  . Sexual activity: Yes    Partners: Male    Birth control/protection: Post-menopausal

## 2017-10-25 NOTE — Progress Notes (Signed)
Full Name: [place]ia Cerami Gender: Female MRN #: [id number] Date of Birth: 10/05/1945    Visit Date: 10/21/2017 10:46 Age: 72 Years 3 Months Old Examining Physician: Naomie DeanAntonia Sajad Glander, MD  Referring Physician: Gaspar Garbeisovec, Richard W, MD, Deveshwar Ambrose PancoastShalli  History: Burning pain in the limbs with imbalance    Summary:  EMG/NCS performed on the right upper and left lower extremities  All nerves and muscles (as detailed in the following tables) were within normal limits.  Conclusion: Normal study. No evidence for large-fiber mononeuropathy,  polyneuropathy or radiculopathy. This test cannot rule out small-fiber neuropathy.  Cc: Tisovec, Adelfa Kohichard W, MD, Deveshwar Pleas KochShalli  Nash[place] Homewood M.D.  [facility] [address] [place], [place] [address] Tel: [phone] Fax: [phone]        [facility]    Nerve / Sites Muscle Latency Ref. Amplitude Ref. Rel Amp Segments Distance Velocity Ref. Area    ms ms mV mV %  cm m/s m/s mVms  R Median - APB     Wrist APB 4.1 ?4.4 4.0 ?4.0 100 Wrist - APB 7   14.7     Upper arm APB 8.6  3.8  94.2 Upper arm - Wrist   ?49 15.5  R Ulnar - ADM     Wrist ADM 2.9 ?3.3 6.0 ?6.0 100 Wrist - ADM 7   15.6     B.Elbow ADM 6.9  6.1  102 B.Elbow - Wrist 20 50 ?49 16.0     A.Elbow ADM 9.0  5.8  95.2 A.Elbow - B.Elbow 10 49 ?49 15.4         A.Elbow - Wrist      L Peroneal - EDB     Ankle EDB 4.5 ?6.5 6.2 ?2.0 100 Ankle - EDB 9   17.8     Fib head EDB 11.7  5.5  89.8 Fib head - Ankle 32 45 ?44 16.8     Pop fossa EDB 14.3  4.7  84 Pop fossa - Fib head 12 46 ?44 15.1         Pop fossa - Ankle      L Tibial - AH     Ankle AH 4.6 ?5.8 6.3 ?4.0 100 Ankle - AH 9   14.1     Pop fossa AH 14.5  3.5  54.9 Pop fossa - Ankle 43 43 ?41 11.2             SNC    Nerve / Sites Rec. Site Peak Lat Ref.  Amp Ref. Segments Distance    ms ms V V  cm  R Radial - Anatomical snuff box (Forearm)     Forearm Wrist 2.6 ?2.9 15 ?15 Forearm - Wrist 10  L Sural - Ankle  (Calf)     Calf Ankle 3.8 ?4.4 13 ?6 Calf - Ankle 14  L Superficial peroneal - Ankle     Lat leg Ankle 4.0 ?4.4 6 ?6 Lat leg - Ankle 14  R Median - Orthodromic (Dig II, Mid palm)     Dig II Wrist 3.0 ?3.4 11 ?10 Dig II - Wrist 13  R Ulnar - Orthodromic, (Dig V, Mid palm)     Dig V Wrist 2.9 ?3.1 6 ?5 Dig V - Wrist 7511                 F  Wave    Nerve F Lat Ref.   ms ms  L Tibial - AH 55.7 ?56.0  R Ulnar - ADM 29.2 ?32.0         EMG full       EMG Summary Table    Spontaneous MUAP Recruitment  Muscle IA Fib PSW Fasc Other Amp Dur. Poly Pattern  R. Deltoid Normal None None None _______ Normal Normal Normal Normal  R. Biceps brachii Normal None None None _______ Normal Normal Normal Normal  R. Triceps brachii Normal None None None _______ Normal Normal Normal Normal  R. Pronator teres Normal None None None _______ Normal Normal Normal Normal  R. Opponens pollicis Normal None None None _______ Normal Normal Normal Normal  R. First dorsal interosseous Normal None None None _______ Normal Normal Normal Normal  L. Vastus medialis Normal None None None _______ Normal Normal Normal Normal  L. Tibialis anterior Normal None None None _______ Normal Normal Normal Normal  L. Gastrocnemius (Medial head) Normal None None None _______ Normal Normal Normal Normal  L. Extensor hallucis longus Normal None None None _______ Normal Normal Normal Normal  L. Iliopsoas Normal None None None _______ Normal Normal Normal Normal

## 2017-10-25 NOTE — Procedures (Signed)
Full Name: [place]ia Heck Gender: Female MRN #: [id number] Date of Birth: June 26, 1945    Visit Date: 10/21/2017 10:46 Age: 72 Years 3 Months Old Examining Physician: Naomie DeanAntonia Bing Duffey, MD  Referring Physician: Gaspar Garbeisovec, Richard W, MD, Deveshwar Ambrose PancoastShalli  History: Burning pain in the limbs with imbalance    Summary:  EMG/NCS performed on the right upper and left lower extremities  All nerves and muscles (as detailed in the following tables) were within normal limits.  Conclusion: Normal study. No evidence for large-fiber mononeuropathy, large-fiber polyneuropathy or radiculopathy. This test cannot rule out small-fiber neuropathy.  Cc: Tisovec, Adelfa Kohichard W, MD, Deveshwar Pleas KochShalli  Hamzeh Tall M.D.  [facility] [address] [place], [place] [address] Tel: [phone] Fax: [phone]        [facility]    Nerve / Sites Muscle Latency Ref. Amplitude Ref. Rel Amp Segments Distance Velocity Ref. Area    ms ms mV mV %  cm m/s m/s mVms  R Median - APB     Wrist APB 4.1 ?4.4 4.0 ?4.0 100 Wrist - APB 7   14.7     Upper arm APB 8.6  3.8  94.2 Upper arm - Wrist   ?49 15.5  R Ulnar - ADM     Wrist ADM 2.9 ?3.3 6.0 ?6.0 100 Wrist - ADM 7   15.6     B.Elbow ADM 6.9  6.1  102 B.Elbow - Wrist 20 50 ?49 16.0     A.Elbow ADM 9.0  5.8  95.2 A.Elbow - B.Elbow 10 49 ?49 15.4         A.Elbow - Wrist      L Peroneal - EDB     Ankle EDB 4.5 ?6.5 6.2 ?2.0 100 Ankle - EDB 9   17.8     Fib head EDB 11.7  5.5  89.8 Fib head - Ankle 32 45 ?44 16.8     Pop fossa EDB 14.3  4.7  84 Pop fossa - Fib head 12 46 ?44 15.1         Pop fossa - Ankle      L Tibial - AH     Ankle AH 4.6 ?5.8 6.3 ?4.0 100 Ankle - AH 9   14.1     Pop fossa AH 14.5  3.5  54.9 Pop fossa - Ankle 43 43 ?41 11.2             SNC    Nerve / Sites Rec. Site Peak Lat Ref.  Amp Ref. Segments Distance    ms ms V V  cm  R Radial - Anatomical snuff box (Forearm)     Forearm Wrist 2.6 ?2.9 15 ?15 Forearm - Wrist 10  L Sural  - Ankle (Calf)     Calf Ankle 3.8 ?4.4 13 ?6 Calf - Ankle 14  L Superficial peroneal - Ankle     Lat leg Ankle 4.0 ?4.4 6 ?6 Lat leg - Ankle 14  R Median - Orthodromic (Dig II, Mid palm)     Dig II Wrist 3.0 ?3.4 11 ?10 Dig II - Wrist 13  R Ulnar - Orthodromic, (Dig V, Mid palm)     Dig V Wrist 2.9 ?3.1 6 ?5 Dig V - Wrist 8011                 F  Wave    Nerve F Lat Ref.   ms ms  L Tibial - AH 55.7 ?56.0  R Ulnar - ADM 29.2 ?32.0         EMG full       EMG Summary Table    Spontaneous MUAP Recruitment  Muscle IA Fib PSW Fasc Other Amp Dur. Poly Pattern  R. Deltoid Normal None None None _______ Normal Normal Normal Normal  R. Biceps brachii Normal None None None _______ Normal Normal Normal Normal  R. Triceps brachii Normal None None None _______ Normal Normal Normal Normal  R. Pronator teres Normal None None None _______ Normal Normal Normal Normal  R. Opponens pollicis Normal None None None _______ Normal Normal Normal Normal  R. First dorsal interosseous Normal None None None _______ Normal Normal Normal Normal  L. Vastus medialis Normal None None None _______ Normal Normal Normal Normal  L. Tibialis anterior Normal None None None _______ Normal Normal Normal Normal  L. Gastrocnemius (Medial head) Normal None None None _______ Normal Normal Normal Normal  L. Extensor hallucis longus Normal None None None _______ Normal Normal Normal Normal  L. Iliopsoas Normal None None None _______ Normal Normal Normal Normal

## 2017-10-27 NOTE — Pre-Procedure Instructions (Signed)
Miranda Romero  10/27/2017      [id number] [address], [place] - [address] [place] [address] Phone: [phone] Fax: [phone]  [facility] [place] - [id number] [place] [address] Phone: [phone] Fax: [phone]  [id number] [place], [place] - [address] [facility] [address] [place] [address] Phone: [phone] Fax: [phone]    Your procedure is scheduled on Aug. 27, 2019 from 7:15AM-9:35AM  [facility]A" at 5:30AM  Call this number if you have problems the morning of surgery:  [phone]   Remember:  Do not eat or drink after midnight on Aug. 26th    Take these medicines the morning of surgery with A SIP OF WATER: AmLODipine (NORVASC), Fexofenadine (ALLEGRA), and Gabapentin (NEURONTIN)  If needed TraMADol (ULTRAM)   7 days before surgery (8/20), stop taking all Other Aspirin Products, Vitamins, Fish oils, and Herbal medications. Also stop all NSAIDS i.e. Advil, Ibuprofen, Motrin, Aleve, Anaprox, Naproxen, BC, Goody Powders, and all Supplements.    Do not wear jewelry, make-up or nail polish.  Do not wear lotions, powders, or perfumes, or deodorant.  Do not shave 48 hours prior to surgery.   Do not bring valuables to the hospital.  [facility] is not responsible for any belongings or valuables.  Contacts, dentures or bridgework may not be worn into surgery.  Leave your suitcase in the car.  After surgery it may be brought to your room.  For patients admitted to the hospital, discharge time will be determined by your treatment team.  Patients discharged the day of surgery will not be allowed to drive home.   Special instructions:   [facility]- Preparing For Surgery  Before surgery, you can play an important role. Because skin is not sterile, your skin needs to be as free of germs  as possible. You can reduce the number of germs on your skin by washing with CHG (chlorahexidine gluconate) Soap before surgery.  CHG is an antiseptic cleaner which kills germs and bonds with the skin to continue killing germs even after washing.    Oral Hygiene is also important to reduce your risk of infection.  Remember - BRUSH YOUR TEETH THE MORNING OF SURGERY WITH YOUR REGULAR TOOTHPASTE  Please do not use if you have an allergy to CHG or antibacterial soaps. If your skin becomes reddened/irritated stop using the CHG.  Do not shave (including legs and underarms) for at least 48 hours prior to first CHG shower. It is OK to shave your face.  Please follow these instructions carefully.   1. Shower the NIGHT BEFORE SURGERY and the MORNING OF SURGERY with CHG.   2. If you chose to wash your hair, wash your hair first as usual with your normal shampoo.  3. After you shampoo, rinse your hair and body thoroughly to remove the shampoo.  4. Use CHG as you would any other liquid soap. You can apply CHG directly to the skin and wash gently with a scrungie or a clean washcloth.   5. Apply the CHG Soap to your body ONLY FROM THE NECK DOWN.  Do not use on open wounds or open sores. Avoid contact with your eyes, ears, mouth and genitals (private parts). Wash Face and genitals (private parts)  with your normal soap.  6. Wash thoroughly, paying special  attention to the area where your surgery will be performed.  7. Thoroughly rinse your body with warm water from the neck down.  8. DO NOT shower/wash with your normal soap after using and rinsing off the CHG Soap.  9. Pat yourself dry with a CLEAN TOWEL.  10. Wear CLEAN PAJAMAS to bed the night before surgery, wear comfortable clothes the morning of surgery  11. Place CLEAN SHEETS on your bed the night of your first shower and DO NOT SLEEP WITH PETS.  Day of Surgery:  Do not apply any deodorants/lotions.  Please wear clean clothes to the  hospital/surgery center.   Remember to brush your teeth WITH YOUR REGULAR TOOTHPASTE.  Please read over the following fact sheets that you were given. Pain Booklet, Coughing and Deep Breathing, MRSA Information and Surgical Site Infection Prevention

## 2017-10-28 ENCOUNTER — Encounter (HOSPITAL_COMMUNITY): Payer: Self-pay

## 2017-10-28 ENCOUNTER — Other Ambulatory Visit: Payer: Self-pay

## 2017-10-28 ENCOUNTER — Encounter (HOSPITAL_COMMUNITY)
Admission: RE | Admit: 2017-10-28 | Discharge: 2017-10-28 | Disposition: A | Discharge status: Home / Self Care | Payer: Medicare Other | Source: Ambulatory Visit | Attending: Orthopaedic Surgery | Admitting: Orthopaedic Surgery

## 2017-10-28 DIAGNOSIS — Z01818 Encounter for other preprocedural examination: Secondary | ICD-10-CM | POA: Insufficient documentation

## 2017-10-28 DIAGNOSIS — Z01812 Encounter for preprocedural laboratory examination: Secondary | ICD-10-CM | POA: Insufficient documentation

## 2017-10-28 LAB — CBC
HCT: 39.1 % (ref 36.0–46.0)
Hemoglobin: 12.8 g/dL (ref 12.0–15.0)
MCH: 31.3 pg (ref 26.0–34.0)
MCHC: 32.7 g/dL (ref 30.0–36.0)
MCV: 95.6 fL (ref 78.0–100.0)
PLATELETS: 375 10*3/uL (ref 150–400)
RBC: 4.09 MIL/uL (ref 3.87–5.11)
RDW: 13.5 % (ref 11.5–15.5)
WBC: 7 10*3/uL (ref 4.0–10.5)

## 2017-10-28 LAB — BASIC METABOLIC PANEL
Anion gap: 9 (ref 5–15)
BUN: 13 mg/dL (ref 8–23)
CHLORIDE: 105 mmol/L (ref 98–111)
CO2: 26 mmol/L (ref 22–32)
Calcium: 9.2 mg/dL (ref 8.9–10.3)
Creatinine, Ser: 0.85 mg/dL (ref 0.44–1.00)
Glucose, Bld: 90 mg/dL (ref 70–99)
Potassium: 4.1 mmol/L (ref 3.5–5.1)
SODIUM: 140 mmol/L (ref 135–145)

## 2017-10-28 LAB — SURGICAL PCR SCREEN
MRSA, PCR: NEGATIVE
Staphylococcus aureus: POSITIVE — AB

## 2017-10-28 NOTE — Progress Notes (Signed)
PCP - Dr. Guerry Bruinichard Tisovec  Cardiologist - Denies  Chest x-ray - Denies  EKG - 10/28/17  Stress Test - Denies  ECHO - Denies  Cardiac Cath - Denies  AICD- Denies PM- Denies LOOP- Denies  Sleep Study - Yes- Positive CPAP - None  LABS- 10/28/17: CBC, BMP, PCR  ASA- Denies  Anesthesia-  No  Pt denies having chest pain, sob, or fever at this time. All instructions explained to the pt, with a verbal understanding of the material. Pt agrees to go over the instructions while at home for a better understanding. The opportunity to ask questions was provided.

## 2017-10-28 NOTE — Progress Notes (Signed)
Pt made aware of the nasal screening positive for Staph. [facility]

## 2017-11-02 ENCOUNTER — Other Ambulatory Visit (INDEPENDENT_AMBULATORY_CARE_PROVIDER_SITE_OTHER): Payer: Self-pay

## 2017-11-03 ENCOUNTER — Encounter (INDEPENDENT_AMBULATORY_CARE_PROVIDER_SITE_OTHER): Payer: Self-pay | Admitting: Orthopedic Surgery

## 2017-11-03 ENCOUNTER — Ambulatory Visit (INDEPENDENT_AMBULATORY_CARE_PROVIDER_SITE_OTHER): Payer: Medicare Other | Admitting: Orthopedic Surgery

## 2017-11-03 VITALS — BP 125/80 | HR 97 | Temp 98.3°F | Resp 18 | Ht 65.0 in | Wt 219.0 lb

## 2017-11-03 DIAGNOSIS — Z8669 Personal history of other diseases of the nervous system and sense organs: Secondary | ICD-10-CM

## 2017-11-03 DIAGNOSIS — M797 Fibromyalgia: Secondary | ICD-10-CM

## 2017-11-03 DIAGNOSIS — M1612 Unilateral primary osteoarthritis, left hip: Secondary | ICD-10-CM

## 2017-11-03 NOTE — Progress Notes (Signed)
Office Visit Note   Patient: Miranda Romero           Date of Birth: 1945/09/26           MRN: [id number] Visit Date: 11/03/2017              Chief Complaint: left hip pain  HPI: Miranda F Sifuentes, [age] y.o. female, has a history of pain and functional disability in the left hip(s) due to arthritis and patient has failed non-surgical conservative treatments for greater than 12 weeks to include NSAID's and/or analgesics, corticosteriod injections, flexibility and strengthening excercises, supervised PT with diminished ADL's post treatment, use of assistive devices, weight reduction as appropriate and activity modification.  Onset of symptoms was gradual starting 6 years ago with gradually worsening course since that time.The patient noted no past surgery on the left hip(s).  Patient currently rates pain in the left hip at 8 out of 10 with activity. Patient has night pain, worsening of pain with activity and weight bearing, trendelenberg gait, pain that interfers with activities of daily living and pain with passive range of motion. Patient has evidence of subchondral cysts, subchondral sclerosis and periarticular osteophytes by imaging studies. This condition presents safety issues increasing the risk of falls. There is no current active infection.  Patient Active Problem List   Diagnosis Date Noted  . Primary osteoarthritis of both hips 09/25/2016  . Fibromyalgia 04/28/2016  . Primary osteoarthritis of both knees 04/28/2016  . History of total knee replacement, bilateral 04/28/2016  . Primary osteoarthritis of both hands 04/28/2016  . Primary insomnia 04/28/2016  . History of sleep apnea 04/28/2016  . History of restless legs syndrome 04/28/2016  . Osteopenia of multiple sites 04/28/2016  . Hiatal hernia with gastroesophageal reflux 11/06/2015  . Incisional hernia 10/16/2010  . HYPERTENSION 05/14/2010  . OBSTRUCTIVE SLEEP APNEA 05/14/2010   Past Medical History:  Diagnosis Date  .  Arthritis   . Blood transfusion   . Fibromyalgia    Dr Corliss Skainseveshwar  . GERD (gastroesophageal reflux disease)   . Hiatal hernia with gastroesophageal reflux 11/06/2015  . History of hiatal hernia   . Hyperlipidemia   . Hypertension    Dr Wylene Simmerisovec LOV 4/13 on chart  . Seasonal allergies   . Sleep apnea    SEVERE  per sleep study 3/12 EPIC no CPAP   . Transfusion history    3 yrs ago s/p surgery for knee  . Umbilical hernia   . Weight decrease     Past Surgical History:  Procedure Laterality Date  . ANKLE SURGERY    . APPENDECTOMY  2014  . BALLOON DILATION N/A 06/05/2015   Procedure: BALLOON DILATION;  Surgeon: Willis ModenaWilliam Outlaw, MD;  Location: [facility];  Service: Endoscopy;  Laterality: N/A;  . CATARACT EXTRACTION Bilateral 05/2013  . CHOLECYSTECTOMY    . DILATION AND CURETTAGE OF UTERUS    . ESOPHAGOGASTRODUODENOSCOPY (EGD) WITH PROPOFOL N/A 06/05/2015   Procedure: ESOPHAGOGASTRODUODENOSCOPY (EGD) WITH PROPOFOL;  Surgeon: Willis ModenaWilliam Outlaw, MD;  Location: [facility];  Service: Endoscopy;  Laterality: N/A;  . HERNIA REPAIR  05/2013   due to appendectomy with mesh  . HIATAL HERNIA REPAIR N/A 11/06/2015   Procedure: LAPAROSCOPIC REPAIR HIATAL HERNIA, Nissen FUNDOPLICATION;  Surgeon: Claud KelpHaywood Ingram, MD;  Location: [facility];  Service: General;  Laterality: N/A;  . JOINT REPLACEMENT    . KNEE ARTHROPLASTY     bilateral  . knee infection     right knee hardware removed 11/12/  replaced 1/13  . VENTRAL HERNIA REPAIR  07/21/2011   Procedure: LAPAROSCOPIC VENTRAL HERNIA;  Surgeon: Ernestene Mention, MD;  Location: [facility];  Service: General;  Laterality: N/A;  Laparoscopic Repair Verntal Incisional Hernia and Mesh    No current facility-administered medications for this encounter.    Current Outpatient Medications  Medication Sig Dispense Refill Last Dose  . amLODipine (NORVASC) 10 MG tablet Take 10 mg by mouth daily.   Taking  . atorvastatin (LIPITOR) 10 MG tablet Take 10 mg by mouth daily.    Taking  . cholecalciferol (VITAMIN D-400) 400 UNITS TABS Take 400 Units by mouth daily with breakfast.    Taking  . Coenzyme Q10 (CO Q-10) 200 MG CAPS Take 200 mg by mouth daily.   Taking  . CRANBERRY PO Take 2 tablets by mouth 2 (two) times daily.    Taking  . DULoxetine (CYMBALTA) 30 MG capsule TAKE 1 CAPSULE BY MOUTH AT  BEDTIME (Patient taking differently: Take 30 mg by mouth at bedtime. ) 90 capsule 0 Taking  . fexofenadine (ALLEGRA) 180 MG tablet Take 180 mg by mouth daily with breakfast.    Taking  . gabapentin (NEURONTIN) 300 MG capsule TAKE 1 CAPSULE BY MOUTH TWO TIMES DAILY (Patient taking differently: Take 300 mg by mouth 2 (two) times daily. ) 180 capsule 0 Taking  . ibuprofen (ADVIL,MOTRIN) 200 MG tablet Take 400 mg by mouth 2 (two) times daily as needed (for pain.).    Taking  . Magnesium 500 MG TABS Take 1,000 mg by mouth 2 (two) times daily.    Taking  . Manganese 50 MG TABS Take 50 mg by mouth daily.   Taking  . Probiotic Product (PROBIOTIC DAILY) CAPS Take 1 tablet by mouth daily.   Taking  . Psyllium (METAMUCIL FIBER PO) Take 5 tablets by mouth 2 (two) times daily.    Taking  . senna (SENOKOT) 8.6 MG tablet Take 4 tablets by mouth daily.    Taking  . Tavaborole (KERYDIN) 5 % SOLN Apply 1 application topically daily. Apply to toes    Taking  . Thiamine HCl (VITAMIN B-1) 100 MG tablet Take 100 mg by mouth daily with breakfast.    Taking  . traMADol (ULTRAM) 50 MG tablet Take 1 tablet (50 mg total) by mouth 2 (two) times daily as needed. (Patient taking differently: Take 50 mg by mouth 2 (two) times daily as needed (for pain.). ) 60 tablet 0 Taking  . CALCIUM PO Take by mouth.   Taking  . mupirocin ointment (BACTROBAN) 2 % APPLY TO EACH NARE TWICE DAILY FOR 5 DAYS PRIOR TO SURGERY  0 Taking   Allergies  Allergen Reactions  . Sudafed [Pseudoephedrine Hcl] Other (See Comments)    hypertactive    Social History   Tobacco Use  . Smoking status: Never Smoker  . Smokeless  tobacco: Never Used  Substance Use Topics  . Alcohol use: Yes    Comment: socially    Family History  Problem Relation Age of Onset  . Lymphoma Mother      Review of Systems  Constitutional: Positive for activity change.  HENT: Negative for trouble swallowing.   Eyes: Negative for pain.  Respiratory: Negative for shortness of breath.   Cardiovascular: Positive for leg swelling.  Gastrointestinal: Positive for constipation.  Endocrine: Positive for cold intolerance and heat intolerance.  Genitourinary: Negative for difficulty urinating.  Musculoskeletal: Positive for joint swelling. Negative for gait problem.  Skin: Negative for rash.  Allergic/Immunologic: Negative for food allergies.  Neurological: Negative for numbness.  Hematological: Bruises/bleeds easily.  Psychiatric/Behavioral: Positive for sleep disturbance.    Objective:  Physical Exam  Constitutional: She is oriented to person, place, and time. She appears well-developed and well-nourished.  HENT:  Head: Normocephalic and atraumatic.  Eyes: Pupils are equal, round, and reactive to light. Conjunctivae and EOM are normal.  Neck: Neck supple. No thyromegaly present.  Cardiovascular: Normal rate, regular rhythm, normal heart sounds and intact distal pulses.  No murmur heard. Respiratory: Effort normal and breath sounds normal.  GI: Soft. Bowel sounds are normal. There is no tenderness.  Neurological: She is alert and oriented to person, place, and time.  Skin: Skin is warm and dry.  Psychiatric: She has a normal mood and affect. Her behavior is normal. Judgment and thought content normal.  Musculoskeletal: She has had decreased range of motion with internal and external rotation of the hip which does cause her pain.  She also has pain with forward flexion I can get her to about 100degrees in the sitting position.  Vital signs in last 24 hours: Temp:  [98.3 F (36.8 C)] 98.3 F (36.8 C) (08/21 1038) Pulse Rate:   [97] 97 (08/21 1038) Resp:  [18] 18 (08/21 1038) BP: (125)/(80) 125/80 (08/21 1038) Weight:  [99.3 kg] 99.3 kg (08/21 1038)   Estimated body mass index is 36.44 kg/m as calculated from the following:   Height as of 11/03/17: 5\' 5"  (1.651 m).   Weight as of 11/03/17: 99.3 kg.   Imaging Review Plain radiographs demonstrate moderate degenerative joint disease of the left hip(s). The bone quality appears to be good for age and reported activity level.    Preoperative templating of the joint replacement has been completed, documented, and submitted to the Operating Room personnel in order to optimize intra-operative equipment management.     Assessment/Plan:  End stage arthritis, left hip(s)  The patient history, physical examination, clinical judgement of the provider and imaging studies are consistent with end stage degenerative joint disease of the left hip(s) and total hip arthroplasty is deemed medically necessary. The treatment options including medical management, injection therapy, arthroscopy and arthroplasty were discussed at length. The risks and benefits of total hip arthroplasty were presented and reviewed. The risks due to aseptic loosening, infection, stiffness, dislocation/subluxation,  thromboembolic complications and other imponderables were discussed.  The patient acknowledged the explanation, agreed to proceed with the plan and consent was signed. Patient is being admitted for inpatient treatment for surgery, pain control, PT, OT, prophylactic antibiotics, VTE prophylaxis, progressive ambulation and ADL's and discharge planning.The patient is planning to be discharged home with home health services   Oris DroneBrian D. Aleda Granaetrarca, PA-C [facility] [phone]  11/03/2017 12:24 PM

## 2017-11-03 NOTE — H&P (Addendum)
TOTAL HIP ADMISSION H&P  Patient is admitted for left total hip arthroplasty.  Subjective:  Chief Complaint: left hip pain  HPI: Miranda Romero, 72 y.o. female, has a history of pain and functional disability in the left hip(s) due to arthritis and patient has failed non-surgical conservative treatments for greater than 12 weeks to include NSAID's and/or analgesics, corticosteriod injections, flexibility and strengthening excercises, supervised PT with diminished ADL's post treatment, use of assistive devices, weight reduction as appropriate and activity modification.  Onset of symptoms was gradual starting 6 years ago with gradually worsening course since that time.The patient noted no past surgery on the left hip(s).  Patient currently rates pain in the left hip at 8 out of 10 with activity. Patient has night pain, worsening of pain with activity and weight bearing, trendelenberg gait, pain that interfers with activities of daily living and pain with passive range of motion. Patient has evidence of subchondral cysts, subchondral sclerosis and periarticular osteophytes by imaging studies. This condition presents safety issues increasing the risk of falls. There is no current active infection.  Patient Active Problem List   Diagnosis Date Noted  . Primary osteoarthritis of both hips 09/25/2016  . Fibromyalgia 04/28/2016  . Primary osteoarthritis of both knees 04/28/2016  . History of total knee replacement, bilateral 04/28/2016  . Primary osteoarthritis of both hands 04/28/2016  . Primary insomnia 04/28/2016  . History of sleep apnea 04/28/2016  . History of restless legs syndrome 04/28/2016  . Osteopenia of multiple sites 04/28/2016  . Hiatal hernia with gastroesophageal reflux 11/06/2015  . Incisional hernia 10/16/2010  . HYPERTENSION 05/14/2010  . OBSTRUCTIVE SLEEP APNEA 05/14/2010   Past Medical History:  Diagnosis Date  . Arthritis   . Blood transfusion   . Fibromyalgia    Dr  Corliss Skains  . GERD (gastroesophageal reflux disease)   . Hiatal hernia with gastroesophageal reflux 11/06/2015  . History of hiatal hernia   . Hyperlipidemia   . Hypertension    Dr Wylene Simmer LOV 4/13 on chart  . Seasonal allergies   . Sleep apnea    SEVERE  per sleep study 3/12 EPIC no CPAP   . Transfusion history    3 yrs ago s/p surgery for knee  . Umbilical hernia   . Weight decrease     Past Surgical History:  Procedure Laterality Date  . ANKLE SURGERY    . APPENDECTOMY  2014  . BALLOON DILATION N/A 06/05/2015   Procedure: BALLOON DILATION;  Surgeon: Willis Modena, MD;  Location: [facility];  Service: Endoscopy;  Laterality: N/A;  . CATARACT EXTRACTION Bilateral 05/2013  . CHOLECYSTECTOMY    . DILATION AND CURETTAGE OF UTERUS    . ESOPHAGOGASTRODUODENOSCOPY (EGD) WITH PROPOFOL N/A 06/05/2015   Procedure: ESOPHAGOGASTRODUODENOSCOPY (EGD) WITH PROPOFOL;  Surgeon: Willis Modena, MD;  Location: [facility];  Service: Endoscopy;  Laterality: N/A;  . HERNIA REPAIR  05/2013   due to appendectomy with mesh  . HIATAL HERNIA REPAIR N/A 11/06/2015   Procedure: LAPAROSCOPIC REPAIR HIATAL HERNIA, [place]issen FUNDOPLICATION;  Surgeon: Claud Kelp, MD;  Location: [facility];  Service: General;  Laterality: N/A;  . JOINT REPLACEMENT    . KNEE ARTHROPLASTY     bilateral  . knee infection     right knee hardware removed 11/12/   replaced 1/13  . VENTRAL HERNIA REPAIR  07/21/2011   Procedure: LAPAROSCOPIC VENTRAL HERNIA;  Surgeon: Ernestene Mention, MD;  Location: [facility];  Service: General;  Laterality: N/A;  Laparoscopic Repair Verntal Incisional  Hernia and Mesh    No current facility-administered medications for this encounter.    Current Outpatient Medications  Medication Sig Dispense Refill Last Dose  . amLODipine (NORVASC) 10 MG tablet Take 10 mg by mouth daily.   Taking  . atorvastatin (LIPITOR) 10 MG tablet Take 10 mg by mouth daily.   Taking  . cholecalciferol (VITAMIN D-400) 400 UNITS TABS  Take 400 Units by mouth daily with breakfast.    Taking  . Coenzyme Q10 (CO Q-10) 200 MG CAPS Take 200 mg by mouth daily.   Taking  . CRANBERRY PO Take 2 tablets by mouth 2 (two) times daily.    Taking  . DULoxetine (CYMBALTA) 30 MG capsule TAKE 1 CAPSULE BY MOUTH AT  BEDTIME (Patient taking differently: Take 30 mg by mouth at bedtime. ) 90 capsule 0 Taking  . fexofenadine (ALLEGRA) 180 MG tablet Take 180 mg by mouth daily with breakfast.    Taking  . gabapentin (NEURONTIN) 300 MG capsule TAKE 1 CAPSULE BY MOUTH TWO TIMES DAILY (Patient taking differently: Take 300 mg by mouth 2 (two) times daily. ) 180 capsule 0 Taking  . ibuprofen (ADVIL,MOTRIN) 200 MG tablet Take 400 mg by mouth 2 (two) times daily as needed (for pain.).    Taking  . Magnesium 500 MG TABS Take 1,000 mg by mouth 2 (two) times daily.    Taking  . Manganese 50 MG TABS Take 50 mg by mouth daily.   Taking  . Probiotic Product (PROBIOTIC DAILY) CAPS Take 1 tablet by mouth daily.   Taking  . Psyllium (METAMUCIL FIBER PO) Take 5 tablets by mouth 2 (two) times daily.    Taking  . senna (SENOKOT) 8.6 MG tablet Take 4 tablets by mouth daily.    Taking  . Tavaborole (KERYDIN) 5 % SOLN Apply 1 application topically daily. Apply to toes    Taking  . Thiamine HCl (VITAMIN B-1) 100 MG tablet Take 100 mg by mouth daily with breakfast.    Taking  . traMADol (ULTRAM) 50 MG tablet Take 1 tablet (50 mg total) by mouth 2 (two) times daily as needed. (Patient taking differently: Take 50 mg by mouth 2 (two) times daily as needed (for pain.). ) 60 tablet 0 Taking  . CALCIUM PO Take by mouth.   Taking  . mupirocin ointment (BACTROBAN) 2 % APPLY TO EACH NARE TWICE DAILY FOR 5 DAYS PRIOR TO SURGERY  0 Taking   Allergies  Allergen Reactions  . Sudafed [Pseudoephedrine Hcl] Other (See Comments)    hypertactive    Social History   Tobacco Use  . Smoking status: Never Smoker  . Smokeless tobacco: Never Used  Substance Use Topics  . Alcohol use: Yes      Comment: socially    Family History  Problem Relation Age of Onset  . Lymphoma Mother      Review of Systems  Constitutional: Positive for activity change.  HENT: Negative for trouble swallowing.   Eyes: Negative for pain.  Respiratory: Negative for shortness of breath.   Cardiovascular: Positive for leg swelling.  Gastrointestinal: Positive for constipation.  Endocrine: Positive for cold intolerance and heat intolerance.  Genitourinary: Negative for difficulty urinating.  Musculoskeletal: Positive for joint swelling. Negative for gait problem.  Skin: Negative for rash.  Allergic/Immunologic: Negative for food allergies.  Neurological: Negative for numbness.  Hematological: Bruises/bleeds easily.  Psychiatric/Behavioral: Positive for sleep disturbance.    Objective:  Physical Exam  Constitutional: She is oriented to person, place,  and time. She appears well-developed and well-nourished.  HENT:  Head: Normocephalic and atraumatic.  Eyes: Pupils are equal, round, and reactive to light. Conjunctivae and EOM are normal.  Neck: Neck supple. No thyromegaly present.  Cardiovascular: Normal rate, regular rhythm, normal heart sounds and intact distal pulses.  No murmur heard. Respiratory: Effort normal and breath sounds normal.  GI: Soft. Bowel sounds are normal. There is no tenderness.  Neurological: She is alert and oriented to person, place, and time.  Skin: Skin is warm and dry.  Psychiatric: She has a normal mood and affect. Her behavior is normal. Judgment and thought content normal.  Musculoskeletal: She has had decreased range of motion with internal and external rotation of the hip which does cause her pain.  She also has pain with forward flexion I can get her to about 100degrees in the sitting position.  Vital signs in last 24 hours: Temp:  [98.3 F (36.8 C)] 98.3 F (36.8 C) (08/21 1038) Pulse Rate:  [97] 97 (08/21 1038) Resp:  [18] 18 (08/21 1038) BP: (125)/(80)  125/80 (08/21 1038) Weight:  [99.3 kg] 99.3 kg (08/21 1038)   Estimated body mass index is 36.44 kg/m as calculated from the following:   Height as of 11/03/17: 5\' 5"  (1.651 m).   Weight as of 11/03/17: 99.3 kg.   Imaging Review Plain radiographs demonstrate moderate degenerative joint disease of the left hip(s). The bone quality appears to be good for age and reported activity level.    Preoperative templating of the joint replacement has been completed, documented, and submitted to the Operating Room personnel in order to optimize intra-operative equipment management.     Assessment/Plan:  End stage arthritis, left hip(s)  The patient history, physical examination, clinical judgement of the provider and imaging studies are consistent with end stage degenerative joint disease of the left hip(s) and total hip arthroplasty is deemed medically necessary. The treatment options including medical management, injection therapy, arthroscopy and arthroplasty were discussed at length. The risks and benefits of total hip arthroplasty were presented and reviewed. The risks due to aseptic loosening, infection, stiffness, dislocation/subluxation,  thromboembolic complications and other imponderables were discussed.  The patient acknowledged the explanation, agreed to proceed with the plan and consent was signed. Patient is being admitted for inpatient treatment for surgery, pain control, PT, OT, prophylactic antibiotics, VTE prophylaxis, progressive ambulation and ADL's and discharge planning.The patient is planning to be discharged home with home health services   Oris DroneBrian D. Aleda Granaetrarca, PA-C [facility] [phone]  11/03/2017 12:24 PM

## 2017-11-08 MED ORDER — ACETAMINOPHEN 10 MG/ML IV SOLN
1000.0000 mg | Freq: Once | INTRAVENOUS | Status: AC
  Administered 2017-11-09: 1000 mg via INTRAVENOUS
  Filled 2017-11-08: qty 100

## 2017-11-08 MED ORDER — SODIUM CHLORIDE 0.9 % IV SOLN: INTRAVENOUS | Status: DC

## 2017-11-08 MED ORDER — TRANEXAMIC ACID 1000 MG/10ML IV SOLN
2000.0000 mg | INTRAVENOUS | Status: AC
  Administered 2017-11-09: 2000 mg via TOPICAL
  Filled 2017-11-08: qty 20

## 2017-11-08 MED ORDER — CEFAZOLIN SODIUM-DEXTROSE 2-4 GM/100ML-% IV SOLN
2.0000 g | INTRAVENOUS | Status: AC
  Administered 2017-11-09: 2 g via INTRAVENOUS
  Filled 2017-11-08: qty 100

## 2017-11-08 NOTE — Anesthesia Preprocedure Evaluation (Addendum)
Anesthesia Evaluation  Patient identified by MRN, date of birth, ID band Patient awake    Reviewed: Allergy & Precautions, NPO status , Patient's Chart, lab work & pertinent test results  Airway Mallampati: II  TM Distance: >3 FB Neck ROM: Full    Dental no notable dental hx. (+) Teeth Intact, Dental Advisory Given   Pulmonary sleep apnea and Continuous Positive Airway Pressure Ventilation ,    Pulmonary exam normal breath sounds clear to auscultation       Cardiovascular Exercise Tolerance: Good hypertension, Pt. on medications Normal cardiovascular exam Rhythm:Regular Rate:Normal     Neuro/Psych  Neuromuscular disease negative psych ROS   GI/Hepatic Neg liver ROS, hiatal hernia, GERD  ,  Endo/Other  Morbid obesity  Renal/GU negative Renal ROS     Musculoskeletal  (+) Arthritis , Fibromyalgia -  Abdominal (+) + obese,   Peds  Hematology negative hematology ROS (+)   Anesthesia Other Findings   Reproductive/Obstetrics                            Lab Results  Component Value Date   WBC 7.0 10/28/2017   HGB 12.8 10/28/2017   HCT 39.1 10/28/2017   MCV 95.6 10/28/2017   PLT 375 10/28/2017    Anesthesia Physical Anesthesia Plan  ASA: III  Anesthesia Plan: Spinal   Post-op Pain Management:    Induction:   PONV Risk Score and Plan: Treatment may vary due to age or medical condition  Airway Management Planned: Natural Airway and Nasal Cannula  Additional Equipment:   Intra-op Plan:   Post-operative Plan: Extubation in OR  Informed Consent: I have reviewed the patients History and Physical, chart, labs and discussed the procedure including the risks, benefits and alternatives for the proposed anesthesia with the patient or authorized representative who has indicated his/her understanding and acceptance.   Dental advisory given and Dental Advisory Given  Plan Discussed with:  CRNA  Anesthesia Plan Comments:        Anesthesia Quick Evaluation

## 2017-11-09 ENCOUNTER — Inpatient Hospital Stay (HOSPITAL_COMMUNITY)
Admission: RE | Admit: 2017-11-09 | Discharge: 2017-11-11 | DRG: 470 | Disposition: A | Discharge status: Home Health | Payer: Medicare Other | Attending: Orthopaedic Surgery | Admitting: Orthopaedic Surgery

## 2017-11-09 ENCOUNTER — Inpatient Hospital Stay (HOSPITAL_COMMUNITY): Payer: Medicare Other | Admitting: Anesthesiology

## 2017-11-09 ENCOUNTER — Inpatient Hospital Stay (HOSPITAL_COMMUNITY): Payer: Medicare Other

## 2017-11-09 ENCOUNTER — Encounter (HOSPITAL_COMMUNITY): Payer: Self-pay | Admitting: Anesthesiology

## 2017-11-09 ENCOUNTER — Other Ambulatory Visit: Payer: Self-pay

## 2017-11-09 ENCOUNTER — Encounter (HOSPITAL_COMMUNITY)
Admission: RE | Disposition: A | Discharge status: Home Health | Payer: Self-pay | Source: Home / Self Care | Attending: Orthopaedic Surgery

## 2017-11-09 DIAGNOSIS — Z96649 Presence of unspecified artificial hip joint: Secondary | ICD-10-CM

## 2017-11-09 DIAGNOSIS — K219 Gastro-esophageal reflux disease without esophagitis: Secondary | ICD-10-CM | POA: Diagnosis present

## 2017-11-09 DIAGNOSIS — E785 Hyperlipidemia, unspecified: Secondary | ICD-10-CM | POA: Diagnosis present

## 2017-11-09 DIAGNOSIS — G4733 Obstructive sleep apnea (adult) (pediatric): Secondary | ICD-10-CM | POA: Diagnosis present

## 2017-11-09 DIAGNOSIS — M1612 Unilateral primary osteoarthritis, left hip: Secondary | ICD-10-CM

## 2017-11-09 DIAGNOSIS — I10 Essential (primary) hypertension: Secondary | ICD-10-CM | POA: Diagnosis present

## 2017-11-09 DIAGNOSIS — Z96653 Presence of artificial knee joint, bilateral: Secondary | ICD-10-CM | POA: Diagnosis present

## 2017-11-09 DIAGNOSIS — Z96642 Presence of left artificial hip joint: Secondary | ICD-10-CM

## 2017-11-09 DIAGNOSIS — Z01818 Encounter for other preprocedural examination: Secondary | ICD-10-CM

## 2017-11-09 DIAGNOSIS — Z79899 Other long term (current) drug therapy: Secondary | ICD-10-CM | POA: Diagnosis not present

## 2017-11-09 DIAGNOSIS — M25552 Pain in left hip: Secondary | ICD-10-CM | POA: Diagnosis present

## 2017-11-09 DIAGNOSIS — M160 Bilateral primary osteoarthritis of hip: Principal | ICD-10-CM | POA: Diagnosis present

## 2017-11-09 DIAGNOSIS — M797 Fibromyalgia: Secondary | ICD-10-CM | POA: Diagnosis present

## 2017-11-09 DIAGNOSIS — Z6836 Body mass index (BMI) 36.0-36.9, adult: Secondary | ICD-10-CM

## 2017-11-09 DIAGNOSIS — M8589 Other specified disorders of bone density and structure, multiple sites: Secondary | ICD-10-CM | POA: Diagnosis present

## 2017-11-09 HISTORY — PX: TOTAL HIP ARTHROPLASTY: SHX124

## 2017-11-09 LAB — CBC WITH DIFFERENTIAL/PLATELET
ABS IMMATURE GRANULOCYTES: 0 10*3/uL (ref 0.0–0.1)
BASOS ABS: 0.1 10*3/uL (ref 0.0–0.1)
BASOS PCT: 1 %
EOS ABS: 0.2 10*3/uL (ref 0.0–0.7)
Eosinophils Relative: 3 %
HCT: 39.6 % (ref 36.0–46.0)
Hemoglobin: 12.9 g/dL (ref 12.0–15.0)
IMMATURE GRANULOCYTES: 0 %
Lymphocytes Relative: 26 %
Lymphs Abs: 1.9 10*3/uL (ref 0.7–4.0)
MCH: 30.5 pg (ref 26.0–34.0)
MCHC: 32.6 g/dL (ref 30.0–36.0)
MCV: 93.6 fL (ref 78.0–100.0)
Monocytes Absolute: 0.5 10*3/uL (ref 0.1–1.0)
Monocytes Relative: 7 %
NEUTROS ABS: 4.5 10*3/uL (ref 1.7–7.7)
NEUTROS PCT: 63 %
PLATELETS: 289 10*3/uL (ref 150–400)
RBC: 4.23 MIL/uL (ref 3.87–5.11)
RDW: 13.5 % (ref 11.5–15.5)
WBC: 7.1 10*3/uL (ref 4.0–10.5)

## 2017-11-09 LAB — COMPREHENSIVE METABOLIC PANEL
ALT: 18 U/L (ref 0–44)
AST: 21 U/L (ref 15–41)
Albumin: 3.6 g/dL (ref 3.5–5.0)
Alkaline Phosphatase: 57 U/L (ref 38–126)
Anion gap: 11 (ref 5–15)
BUN: 15 mg/dL (ref 8–23)
CHLORIDE: 105 mmol/L (ref 98–111)
CO2: 26 mmol/L (ref 22–32)
CREATININE: 0.91 mg/dL (ref 0.44–1.00)
Calcium: 9.4 mg/dL (ref 8.9–10.3)
Glucose, Bld: 114 mg/dL — ABNORMAL HIGH (ref 70–99)
POTASSIUM: 3.5 mmol/L (ref 3.5–5.1)
SODIUM: 142 mmol/L (ref 135–145)
Total Bilirubin: 0.8 mg/dL (ref 0.3–1.2)
Total Protein: 6.7 g/dL (ref 6.5–8.1)

## 2017-11-09 LAB — TYPE AND SCREEN
ABO/RH(D): O POS
ANTIBODY SCREEN: NEGATIVE

## 2017-11-09 LAB — PROTIME-INR
INR: 0.99
PROTHROMBIN TIME: 13 s (ref 11.4–15.2)

## 2017-11-09 LAB — APTT: aPTT: 33 seconds (ref 24–36)

## 2017-11-09 LAB — ABO/RH: ABO/RH(D): O POS

## 2017-11-09 SURGERY — ARTHROPLASTY, HIP, TOTAL,POSTERIOR APPROACH
Anesthesia: Spinal | Laterality: Left

## 2017-11-09 MED ORDER — MEPERIDINE HCL 50 MG/ML IJ SOLN: 6.2500 mg | INTRAMUSCULAR | Status: DC | PRN

## 2017-11-09 MED ORDER — FENTANYL CITRATE (PF) 250 MCG/5ML IJ SOLN
INTRAMUSCULAR | Status: AC
  Filled 2017-11-09: qty 5

## 2017-11-09 MED ORDER — SODIUM CHLORIDE 0.9 % IV SOLN
INTRAVENOUS | Status: DC | PRN
  Administered 2017-11-09: 50 ug/min via INTRAVENOUS

## 2017-11-09 MED ORDER — CHLORHEXIDINE GLUCONATE 4 % EX LIQD: 60.0000 mL | Freq: Once | CUTANEOUS | Status: DC

## 2017-11-09 MED ORDER — MENTHOL 3 MG MT LOZG: 1.0000 | LOZENGE | OROMUCOSAL | Status: DC | PRN

## 2017-11-09 MED ORDER — MUPIROCIN 2 % EX OINT: TOPICAL_OINTMENT | Freq: Every day | CUTANEOUS | Status: DC

## 2017-11-09 MED ORDER — MAGNESIUM OXIDE 400 (241.3 MG) MG PO TABS
400.0000 mg | ORAL_TABLET | Freq: Two times a day (BID) | ORAL | Status: DC
  Administered 2017-11-09 – 2017-11-11 (×3): 400 mg via ORAL
  Filled 2017-11-09 (×3): qty 1

## 2017-11-09 MED ORDER — DOCUSATE SODIUM 100 MG PO CAPS
100.0000 mg | ORAL_CAPSULE | Freq: Two times a day (BID) | ORAL | Status: DC
  Administered 2017-11-09 – 2017-11-11 (×4): 100 mg via ORAL
  Filled 2017-11-09 (×4): qty 1

## 2017-11-09 MED ORDER — PROPOFOL 500 MG/50ML IV EMUL
INTRAVENOUS | Status: DC | PRN
  Administered 2017-11-09: 75 ug/kg/min via INTRAVENOUS

## 2017-11-09 MED ORDER — METHOCARBAMOL 1000 MG/10ML IJ SOLN
500.0000 mg | Freq: Four times a day (QID) | INTRAVENOUS | Status: DC | PRN
  Filled 2017-11-09: qty 5

## 2017-11-09 MED ORDER — BUPIVACAINE IN DEXTROSE 0.75-8.25 % IT SOLN
INTRATHECAL | Status: DC | PRN
  Administered 2017-11-09: 2 mL via INTRATHECAL

## 2017-11-09 MED ORDER — AMLODIPINE BESYLATE 10 MG PO TABS
10.0000 mg | ORAL_TABLET | Freq: Every day | ORAL | Status: DC
  Administered 2017-11-10 – 2017-11-11 (×2): 10 mg via ORAL
  Filled 2017-11-09 (×2): qty 1

## 2017-11-09 MED ORDER — LACTATED RINGERS IV SOLN
INTRAVENOUS | Status: DC | PRN
  Administered 2017-11-09 (×2): via INTRAVENOUS

## 2017-11-09 MED ORDER — RISAQUAD PO CAPS
1.0000 | ORAL_CAPSULE | Freq: Every day | ORAL | Status: DC
  Administered 2017-11-11: 1 via ORAL
  Filled 2017-11-09: qty 1

## 2017-11-09 MED ORDER — METHOCARBAMOL 500 MG PO TABS
ORAL_TABLET | ORAL | Status: AC
  Filled 2017-11-09: qty 1

## 2017-11-09 MED ORDER — METHOCARBAMOL 500 MG PO TABS
500.0000 mg | ORAL_TABLET | Freq: Four times a day (QID) | ORAL | Status: DC | PRN
  Administered 2017-11-09: 500 mg via ORAL

## 2017-11-09 MED ORDER — DULOXETINE HCL 30 MG PO CPEP
30.0000 mg | ORAL_CAPSULE | Freq: Every day | ORAL | Status: DC
  Administered 2017-11-09 – 2017-11-10 (×2): 30 mg via ORAL
  Filled 2017-11-09 (×2): qty 1

## 2017-11-09 MED ORDER — BISACODYL 10 MG RE SUPP: 10.0000 mg | Freq: Every day | RECTAL | Status: DC | PRN

## 2017-11-09 MED ORDER — 0.9 % SODIUM CHLORIDE (POUR BTL) OPTIME
TOPICAL | Status: DC | PRN
  Administered 2017-11-09: 1000 mL

## 2017-11-09 MED ORDER — PSYLLIUM 95 % PO PACK
1.0000 | PACK | Freq: Two times a day (BID) | ORAL | Status: DC
  Filled 2017-11-09 (×5): qty 1

## 2017-11-09 MED ORDER — ATORVASTATIN CALCIUM 10 MG PO TABS
10.0000 mg | ORAL_TABLET | Freq: Every day | ORAL | Status: DC
  Administered 2017-11-11: 10 mg via ORAL
  Filled 2017-11-09: qty 1

## 2017-11-09 MED ORDER — SODIUM CHLORIDE 0.9 % IV SOLN
75.0000 mL/h | INTRAVENOUS | Status: DC
  Administered 2017-11-09: 75 mL/h via INTRAVENOUS

## 2017-11-09 MED ORDER — OXYCODONE HCL 5 MG PO TABS
5.0000 mg | ORAL_TABLET | ORAL | Status: DC | PRN
  Administered 2017-11-09 – 2017-11-11 (×3): 10 mg via ORAL
  Filled 2017-11-09 (×3): qty 2

## 2017-11-09 MED ORDER — ONDANSETRON HCL 4 MG/2ML IJ SOLN: 4.0000 mg | Freq: Four times a day (QID) | INTRAMUSCULAR | Status: DC | PRN

## 2017-11-09 MED ORDER — METOCLOPRAMIDE HCL 5 MG PO TABS: 5.0000 mg | ORAL_TABLET | Freq: Three times a day (TID) | ORAL | Status: DC | PRN

## 2017-11-09 MED ORDER — LORATADINE 10 MG PO TABS
10.0000 mg | ORAL_TABLET | Freq: Every day | ORAL | Status: DC
  Administered 2017-11-11: 10 mg via ORAL
  Filled 2017-11-09: qty 1

## 2017-11-09 MED ORDER — MIDAZOLAM HCL 2 MG/2ML IJ SOLN
INTRAMUSCULAR | Status: AC
  Filled 2017-11-09: qty 2

## 2017-11-09 MED ORDER — DIPHENHYDRAMINE HCL 12.5 MG/5ML PO ELIX: 12.5000 mg | ORAL_SOLUTION | ORAL | Status: DC | PRN

## 2017-11-09 MED ORDER — PHENOL 1.4 % MT LIQD: 1.0000 | OROMUCOSAL | Status: DC | PRN

## 2017-11-09 MED ORDER — HYDROCODONE-ACETAMINOPHEN 7.5-325 MG PO TABS
1.0000 | ORAL_TABLET | Freq: Once | ORAL | Status: AC | PRN
  Administered 2017-11-09: 1 via ORAL

## 2017-11-09 MED ORDER — BUPIVACAINE-EPINEPHRINE (PF) 0.25% -1:200000 IJ SOLN
INTRAMUSCULAR | Status: AC
  Filled 2017-11-09: qty 30

## 2017-11-09 MED ORDER — HYDROCODONE-ACETAMINOPHEN 7.5-325 MG PO TABS
ORAL_TABLET | ORAL | Status: AC
  Filled 2017-11-09: qty 1

## 2017-11-09 MED ORDER — CHOLECALCIFEROL 10 MCG (400 UNIT) PO TABS
400.0000 [IU] | ORAL_TABLET | Freq: Every day | ORAL | Status: DC
  Administered 2017-11-11: 400 [IU] via ORAL
  Filled 2017-11-09: qty 1

## 2017-11-09 MED ORDER — ONDANSETRON HCL 4 MG PO TABS: 4.0000 mg | ORAL_TABLET | Freq: Four times a day (QID) | ORAL | Status: DC | PRN

## 2017-11-09 MED ORDER — CO Q-10 200 MG PO CAPS: 200.0000 mg | ORAL_CAPSULE | Freq: Every day | ORAL | Status: DC

## 2017-11-09 MED ORDER — MAGNESIUM HYDROXIDE 400 MG/5ML PO SUSP: 30.0000 mL | Freq: Every day | ORAL | Status: DC | PRN

## 2017-11-09 MED ORDER — PROPOFOL 10 MG/ML IV BOLUS
INTRAVENOUS | Status: AC
  Filled 2017-11-09: qty 20

## 2017-11-09 MED ORDER — ASPIRIN 81 MG PO CHEW
81.0000 mg | CHEWABLE_TABLET | Freq: Two times a day (BID) | ORAL | Status: DC
  Administered 2017-11-09 – 2017-11-11 (×4): 81 mg via ORAL
  Filled 2017-11-09 (×4): qty 1

## 2017-11-09 MED ORDER — CEFAZOLIN SODIUM-DEXTROSE 1-4 GM/50ML-% IV SOLN
1.0000 g | Freq: Four times a day (QID) | INTRAVENOUS | Status: AC
  Administered 2017-11-09 (×2): 1 g via INTRAVENOUS
  Filled 2017-11-09 (×2): qty 50

## 2017-11-09 MED ORDER — MIDAZOLAM HCL 5 MG/5ML IJ SOLN
INTRAMUSCULAR | Status: DC | PRN
  Administered 2017-11-09: 2 mg via INTRAVENOUS

## 2017-11-09 MED ORDER — HYDROMORPHONE HCL 1 MG/ML IJ SOLN: 0.2500 mg | INTRAMUSCULAR | Status: DC | PRN

## 2017-11-09 MED ORDER — TRAMADOL HCL 50 MG PO TABS: 50.0000 mg | ORAL_TABLET | Freq: Three times a day (TID) | ORAL | Status: DC | PRN

## 2017-11-09 MED ORDER — ACETAMINOPHEN 10 MG/ML IV SOLN
1000.0000 mg | Freq: Four times a day (QID) | INTRAVENOUS | Status: AC
  Administered 2017-11-09 – 2017-11-10 (×4): 1000 mg via INTRAVENOUS
  Filled 2017-11-09 (×4): qty 100

## 2017-11-09 MED ORDER — CALCIUM 75 MG PO TABS: 1.0000 | ORAL_TABLET | Freq: Once | ORAL | Status: DC

## 2017-11-09 MED ORDER — BUPIVACAINE-EPINEPHRINE (PF) 0.25% -1:200000 IJ SOLN
INTRAMUSCULAR | Status: DC | PRN
  Administered 2017-11-09: 30 mL via PERINEURAL

## 2017-11-09 MED ORDER — FENTANYL CITRATE (PF) 100 MCG/2ML IJ SOLN
INTRAMUSCULAR | Status: DC | PRN
  Administered 2017-11-09: 50 ug via INTRAVENOUS

## 2017-11-09 MED ORDER — ACETAMINOPHEN 10 MG/ML IV SOLN: 1000.0000 mg | Freq: Once | INTRAVENOUS | Status: DC | PRN

## 2017-11-09 MED ORDER — ALUM & MAG HYDROXIDE-SIMETH 200-200-20 MG/5ML PO SUSP: 30.0000 mL | ORAL | Status: DC | PRN

## 2017-11-09 MED ORDER — SENNA 8.6 MG PO TABS
4.0000 | ORAL_TABLET | Freq: Every day | ORAL | Status: DC
  Administered 2017-11-11: 34.4 mg via ORAL
  Filled 2017-11-09: qty 4

## 2017-11-09 MED ORDER — TAVABOROLE 5 % EX SOLN: 1.0000 "application " | Freq: Every day | CUTANEOUS | Status: DC

## 2017-11-09 MED ORDER — VITAMIN B-1 100 MG PO TABS
100.0000 mg | ORAL_TABLET | Freq: Every day | ORAL | Status: DC
  Administered 2017-11-11: 100 mg via ORAL
  Filled 2017-11-09: qty 1

## 2017-11-09 MED ORDER — GABAPENTIN 300 MG PO CAPS
300.0000 mg | ORAL_CAPSULE | Freq: Two times a day (BID) | ORAL | Status: DC
  Administered 2017-11-09 – 2017-11-11 (×4): 300 mg via ORAL
  Filled 2017-11-09 (×4): qty 1

## 2017-11-09 MED ORDER — METOCLOPRAMIDE HCL 5 MG/ML IJ SOLN: 5.0000 mg | Freq: Three times a day (TID) | INTRAMUSCULAR | Status: DC | PRN

## 2017-11-09 MED ORDER — ACETAMINOPHEN 325 MG PO TABS
325.0000 mg | ORAL_TABLET | Freq: Four times a day (QID) | ORAL | Status: DC | PRN
  Administered 2017-11-10: 650 mg via ORAL
  Filled 2017-11-09 (×2): qty 2

## 2017-11-09 MED ORDER — KETOROLAC TROMETHAMINE 15 MG/ML IJ SOLN
7.5000 mg | Freq: Four times a day (QID) | INTRAMUSCULAR | Status: AC
  Administered 2017-11-09 – 2017-11-10 (×3): 7.5 mg via INTRAVENOUS
  Filled 2017-11-09 (×3): qty 1

## 2017-11-09 MED ORDER — ONDANSETRON HCL 4 MG/2ML IJ SOLN: 4.0000 mg | Freq: Once | INTRAMUSCULAR | Status: DC | PRN

## 2017-11-09 MED ORDER — MAGNESIUM CITRATE PO SOLN: 1.0000 | Freq: Once | ORAL | Status: DC | PRN

## 2017-11-09 MED ORDER — LACTATED RINGERS IV SOLN: INTRAVENOUS | Status: DC

## 2017-11-09 MED ORDER — ONDANSETRON HCL 4 MG/2ML IJ SOLN
INTRAMUSCULAR | Status: DC | PRN
  Administered 2017-11-09: 4 mg via INTRAVENOUS

## 2017-11-09 SURGICAL SUPPLY — 62 items
ARTICULEZE HEAD (Hips) ×3 IMPLANT
BAG DECANTER FOR FLEXI CONT (MISCELLANEOUS) ×2 IMPLANT
BLADE SAW SAG 73X25 THK (BLADE) ×1
BLADE SAW SGTL 73X25 THK (BLADE) ×2 IMPLANT
COVER SURGICAL LIGHT HANDLE (MISCELLANEOUS) ×3 IMPLANT
DECANTER SPIKE VIAL GLASS SM (MISCELLANEOUS) ×3 IMPLANT
DRAPE INCISE IOBAN 66X45 STRL (DRAPES) ×2 IMPLANT
DRAPE ORTHO SPLIT 77X108 STRL (DRAPES) ×6
DRAPE SURG ORHT 6 SPLT 77X108 (DRAPES) ×2 IMPLANT
DRSG AQUACEL AG ADV 3.5X10 (GAUZE/BANDAGES/DRESSINGS) ×2 IMPLANT
DRSG MEPILEX BORDER 4X12 (GAUZE/BANDAGES/DRESSINGS) ×3 IMPLANT
DURAPREP 26ML APPLICATOR (WOUND CARE) ×6 IMPLANT
ELECT BLADE 4.0 EZ CLEAN MEGAD (MISCELLANEOUS) ×3
ELECT REM PT RETURN 9FT ADLT (ELECTROSURGICAL) ×3
ELECTRODE BLDE 4.0 EZ CLN MEGD (MISCELLANEOUS) IMPLANT
ELECTRODE REM PT RTRN 9FT ADLT (ELECTROSURGICAL) ×1 IMPLANT
ELIMINATOR HOLE APEX DEPUY (Hips) ×2 IMPLANT
EVACUATOR 1/8 PVC DRAIN (DRAIN) IMPLANT
FACESHIELD WRAPAROUND (MASK) ×9 IMPLANT
FACESHIELD WRAPAROUND OR TEAM (MASK) ×3 IMPLANT
GLOVE BIOGEL PI IND STRL 8 (GLOVE) ×2 IMPLANT
GLOVE BIOGEL PI IND STRL 8.5 (GLOVE) ×1 IMPLANT
GLOVE BIOGEL PI INDICATOR 8 (GLOVE) ×4
GLOVE BIOGEL PI INDICATOR 8.5 (GLOVE) ×2
GLOVE ECLIPSE 8.0 STRL XLNG CF (GLOVE) ×6 IMPLANT
GLOVE ECLIPSE 8.5 STRL (GLOVE) ×6 IMPLANT
GOWN STRL REUS W/ TWL LRG LVL3 (GOWN DISPOSABLE) ×2 IMPLANT
GOWN STRL REUS W/TWL 2XL LVL3 (GOWN DISPOSABLE) ×6 IMPLANT
GOWN STRL REUS W/TWL LRG LVL3 (GOWN DISPOSABLE) ×6
HANDPIECE INTERPULSE COAX TIP (DISPOSABLE)
HEAD ARTICULEZE (Hips) IMPLANT
IMMOBILIZER KNEE 22 UNIV (SOFTGOODS) ×3 IMPLANT
KIT BASIN OR (CUSTOM PROCEDURE TRAY) ×3 IMPLANT
KIT TURNOVER KIT B (KITS) ×3 IMPLANT
LINER NEUTRAL 52X36X52 PLUS 4 (Liner) ×2 IMPLANT
MANIFOLD NEPTUNE II (INSTRUMENTS) ×3 IMPLANT
NEEDLE 22X1 1/2 (OR ONLY) (NEEDLE) ×3 IMPLANT
NS IRRIG 1000ML POUR BTL (IV SOLUTION) ×3 IMPLANT
PACK TOTAL JOINT (CUSTOM PROCEDURE TRAY) ×3 IMPLANT
PAD ARMBOARD 7.5X6 YLW CONV (MISCELLANEOUS) ×3 IMPLANT
PIN SECTOR W/GRIP ACE CUP 52MM (Hips) ×2 IMPLANT
SCREW 6.5MMX30MM (Screw) ×2 IMPLANT
SET HNDPC FAN SPRY TIP SCT (DISPOSABLE) IMPLANT
STAPLER VISISTAT 35W (STAPLE) IMPLANT
STEM FEM CMNTLSS LG AML 13.5 (Hips) ×2 IMPLANT
SUCTION FRAZIER HANDLE 10FR (MISCELLANEOUS) ×2
SUCTION TUBE FRAZIER 10FR DISP (MISCELLANEOUS) ×1 IMPLANT
SUT BONE WAX W31G (SUTURE) ×2 IMPLANT
SUT ETHIBOND NAB CT1 #1 30IN (SUTURE) ×9 IMPLANT
SUT MNCRL AB 3-0 PS2 18 (SUTURE) ×3 IMPLANT
SUT VIC AB 0 CT1 27 (SUTURE) ×9
SUT VIC AB 0 CT1 27XBRD ANBCTR (SUTURE) ×2 IMPLANT
SUT VIC AB 1 CT1 27 (SUTURE) ×6
SUT VIC AB 1 CT1 27XBRD ANBCTR (SUTURE) ×2 IMPLANT
SUT VIC AB 2-0 CT1 27 (SUTURE) ×3
SUT VIC AB 2-0 CT1 TAPERPNT 27 (SUTURE) ×1 IMPLANT
SYR CONTROL 10ML LL (SYRINGE) ×3 IMPLANT
TOWEL OR 17X24 6PK STRL BLUE (TOWEL DISPOSABLE) ×3 IMPLANT
TOWEL OR 17X26 10 PK STRL BLUE (TOWEL DISPOSABLE) ×3 IMPLANT
TRAY CATH 16FR W/PLASTIC CATH (SET/KITS/TRAYS/PACK) IMPLANT
WATER STERILE IRR 1000ML POUR (IV SOLUTION) ×1 IMPLANT
WRAP KNEE MAXI GEL POST OP (GAUZE/BANDAGES/DRESSINGS) ×3 IMPLANT

## 2017-11-09 NOTE — Progress Notes (Signed)
PATIENT ID:      [name]  MRN:     [id number] DOB/AGE:    1945-07-17 / 72 y.o.       OPERATIVE REPORT    DATE OF PROCEDURE:  11/09/2017       PREOPERATIVE DIAGNOSIS:END STAGE LEFT HIP OSTEOARTHRITIS                                                       Estimated body mass index is 36.44 kg/m as calculated from the following:   Height as of 11/03/17: 5\' 5"  (1.651 m).   Weight as of 11/03/17: 99.3 kg.     POSTOPERATIVE DIAGNOSIS: END STAGE  LEFT HIP OSTEOARTHRITIS                                                                     Estimated body mass index is 36.44 kg/m as calculated from the following:   Height as of 11/03/17: 5\' 5"  (1.651 m).   Weight as of 11/03/17: 99.3 kg.     PROCEDURE:  Procedure(s): LEFT TOTAL HIP ARTHROPLASTY      SURGEON:  Norlene CampbellPeter Shia Eber, MD    ASSISTANT:   Jacqualine CodeBrian Petrarca, PA-C   (Present and scrubbed throughout the case, critical for assistance with exposure, retraction, instrumentation, and closure.)          ANESTHESIA: spinal and IV sedation     DRAINS: none :      TOURNIQUET TIME: * No tourniquets in log *    COMPLICATIONS:  None   CONDITION:  stable  PROCEDURE IN DETAIL: [id number]  [name] W [name] 11/09/2017, 9:16 AM  Patient ID: [place] F [name], female   DOB: 1945-07-17, 72 y.o.   MRN: [id number]

## 2017-11-09 NOTE — Progress Notes (Signed)
PHARMACIST - PHYSICIAN ORDER COMMUNICATION  CONCERNING: P&T Medication Policy on Herbal Medications  DESCRIPTION:  This patient's order for:  Co-enzyme q10 and low dose calcium  has been noted.  This product(s) is classified as an "herbal" or natural product. Due to a lack of definitive safety studies or FDA approval, nonstandard manufacturing practices, plus the potential risk of unknown drug-drug interactions while on inpatient medications, the Pharmacy and Therapeutics Committee does not permit the use of "herbal" or natural products of this type within [facility].   ACTION TAKEN: The pharmacy department is unable to verify this order at this time and your patient has been informed of this safety policy. Please reevaluate patient's clinical condition at discharge and address if the herbal or natural product(s) should be resumed at that time.

## 2017-11-09 NOTE — Anesthesia Procedure Notes (Signed)
Spinal  Patient location during procedure: OR Start time: 11/09/2017 7:18 AM End time: 11/09/2017 7:22 AM Staffing Anesthesiologist: Trevor IhaHouser, Athina Fahey A, MD Performed: anesthesiologist  Preanesthetic Checklist Completed: patient identified, surgical consent, pre-op evaluation, timeout performed, IV checked, risks and benefits discussed and monitors and equipment checked Spinal Block Patient position: sitting Prep: site prepped and draped and DuraPrep Patient monitoring: heart rate, cardiac monitor, continuous pulse ox and blood pressure Approach: midline Location: L3-4 Injection technique: single-shot Needle Needle type: Pencan  Needle gauge: 24 G Needle length: 10 cm Needle insertion depth: 7 cm Assessment Sensory level: T4 Additional Notes 1 attempt.   Pt tolerated procedure well

## 2017-11-09 NOTE — Evaluation (Signed)
Physical Therapy Evaluation Patient Details Name: Miranda Romero MRN: [id number] DOB: Jul 27, 1945 Today's Date: 11/09/2017   History of Present Illness  Pt is a 72 y/o female s/p elective L THA, posterior approach. PMH includes fibromyalgia, HTN, sleep apnea, and knee surgery per pt.   Clinical Impression  Pt is s/p surgery above with deficits below. Pt tolerated gait training well with use of RW. Required min to min guard A for mobility tasks. Educated about posterior hip precautions and how to maintain during mobility. Will continue to follow acutely to maximize functional mobility independence and safety.     Follow Up Recommendations Follow surgeon's recommendation for DC plan and follow-up therapies;Supervision for mobility/OOB    Equipment Recommendations  Rolling walker with 5" wheels    Recommendations for Other Services OT consult     Precautions / Restrictions Precautions Precautions: Posterior Hip Precaution Booklet Issued: Yes (comment) Precaution Comments: Reviewed posterior hip precautions with pt.  Required Braces or Orthoses: Knee Immobilizer - Left Knee Immobilizer - Left: Other (comment)(On when in bed ) Restrictions Weight Bearing Restrictions: Yes LLE Weight Bearing: Weight bearing as tolerated      Mobility  Bed Mobility Overal bed mobility: Needs Assistance Bed Mobility: Supine to Sit     Supine to sit: Mod assist     General bed mobility comments: Mod A for LLE assist and trunk elevation. Verbal cues to maintain precautions throughout bed mobility.   Transfers Overall transfer level: Needs assistance Equipment used: Rolling walker (2 wheeled) Transfers: Sit to/from Stand Sit to Stand: Min assist         General transfer comment: Min A for lift assist and steadying. Verbal cues for safe hand placement and to kick out LLE to maintain hip precautions.   Ambulation/Gait Ambulation/Gait assistance: Min guard Gait Distance (Feet): 50  Feet Assistive device: Rolling walker (2 wheeled) Gait Pattern/deviations: Step-to pattern;Step-through pattern;Decreased step length - left;Decreased step length - right;Decreased weight shift to left;Antalgic Gait velocity: Decreased    General Gait Details: Slow, antalgic gait. Able to progress to step through patter with increased distance. Verbal cues for sequencing using RW and cues to maintain precautions when turning.   Stairs            Wheelchair Mobility    Modified Rankin (Stroke Patients Only)       Balance Overall balance assessment: Needs assistance Sitting-balance support: No upper extremity supported;Feet supported Sitting balance-Leahy Scale: Good     Standing balance support: Bilateral upper extremity supported;During functional activity Standing balance-Leahy Scale: Poor Standing balance comment: Reliant on BUE support                              Pertinent Vitals/Pain Pain Assessment: 0-10 Pain Score: 6  Pain Location: L hip  Pain Descriptors / Indicators: Aching;Operative site guarding Pain Intervention(s): Limited activity within patient's tolerance;Monitored during session;Repositioned    Home Living Family/patient expects to be discharged to:: Private residence Living Arrangements: Spouse/significant other Available Help at Discharge: Family;Available 24 hours/day Type of Home: House Home Access: Stairs to enter Entrance Stairs-Rails: Right Entrance Stairs-Number of Steps: 3 Home Layout: One level Home Equipment: Bedside commode;Walker - standard;Cane - single point      Prior Function Level of Independence: Independent with assistive device(s)         Comments: REports she was using cane for ambulation      Hand Dominance  Extremity/Trunk Assessment   Upper Extremity Assessment Upper Extremity Assessment: Defer to OT evaluation    Lower Extremity Assessment Lower Extremity Assessment: LLE  deficits/detail LLE Deficits / Details: Reports some numbness at incision area. Deficits consistent with post op pain and weakness.     Cervical / Trunk Assessment Cervical / Trunk Assessment: Normal  Communication   Communication: No difficulties  Cognition Arousal/Alertness: Awake/alert Behavior During Therapy: WFL for tasks assessed/performed Overall Cognitive Status: Within Functional Limits for tasks assessed                                        General Comments General comments (skin integrity, edema, etc.): Pt's husband present during session.     Exercises Total Joint Exercises Ankle Circles/Pumps: AROM;Both;20 reps   Assessment/Plan    PT Assessment Patient needs continued PT services  PT Problem List Decreased strength;Decreased range of motion;Decreased balance;Decreased mobility;Decreased knowledge of use of DME;Decreased knowledge of precautions;Pain       PT Treatment Interventions DME instruction;Gait training;Stair training;Functional mobility training;Therapeutic activities;Therapeutic exercise;Balance training;Patient/family education    PT Goals (Current goals can be found in the Care Plan section)  Acute Rehab PT Goals Patient Stated Goal: to be able to walk more  PT Goal Formulation: With patient Time For Goal Achievement: 11/23/17 Potential to Achieve Goals: Good    Frequency 7X/week   Barriers to discharge        Co-evaluation               AM-PAC PT "6 Clicks" Daily Activity  Outcome Measure Difficulty turning over in bed (including adjusting bedclothes, sheets and blankets)?: A Lot Difficulty moving from lying on back to sitting on the side of the bed? : Unable Difficulty sitting down on and standing up from a chair with arms (e.g., wheelchair, bedside commode, etc,.)?: Unable Help needed moving to and from a bed to chair (including a wheelchair)?: A Little Help needed walking in hospital room?: A Little Help needed  climbing 3-5 steps with a railing? : A Lot 6 Click Score: 12    End of Session Equipment Utilized During Treatment: Gait belt Activity Tolerance: Patient tolerated treatment well Patient left: in chair;with call bell/phone within reach;with family/visitor present Nurse Communication: Mobility status PT Visit Diagnosis: Muscle weakness (generalized) (M62.81);Other abnormalities of gait and mobility (R26.89);Unsteadiness on feet (R26.81);Pain Pain - Right/Left: Left Pain - part of body: Hip    Time: 1355-1431 PT Time Calculation (min) (ACUTE ONLY): 36 min   Charges:   PT Evaluation $PT Eval Low Complexity: 1 Low PT Treatments $Gait Training: 8-22 mins        Gladys DammeBrittany Breken Nazari, PT, DPT  Acute Rehabilitation Services  Pager: [phone]   Lehman PromBrittany S Ginelle Bays 11/09/2017, 2:37 PM

## 2017-11-09 NOTE — Anesthesia Procedure Notes (Signed)
Procedure Name: MAC Date/Time: 11/09/2017 7:17 AM Performed by: Neldon Newport, CRNA Pre-anesthesia Checklist: Patient being monitored, Timeout performed, Suction available, Emergency Drugs available and Patient identified Patient Re-evaluated:Patient Re-evaluated prior to induction Oxygen Delivery Method: Simple face mask Placement Confirmation: positive ETCO2 Dental Injury: Teeth and Oropharynx as per pre-operative assessment

## 2017-11-09 NOTE — H&P (Signed)
The recent History & Physical has been reviewed. I have personally examined the patient today. There is no interval change to the documented History & Physical. The patient would like to proceed with the procedure.  Miranda Romero W [name] 11/09/2017,  7:09 AM

## 2017-11-09 NOTE — Transfer of Care (Signed)
Immediate Anesthesia Transfer of Care Note  Patient: Miranda Romero  Procedure(s) Performed: LEFT TOTAL HIP ARTHROPLASTY (Left )  Patient Location: PACU  Anesthesia Type:MAC and Spinal  Level of Consciousness: awake, alert  and oriented  Airway & Oxygen Therapy: Patient Spontanous Breathing and Patient connected to nasal cannula oxygen  Post-op Assessment: Report given to RN, Post -op Vital signs reviewed and stable and Patient moving all extremities X 4  Post vital signs: Reviewed and stable  Last Vitals:  Vitals Value Taken Time  BP 120/88 11/09/2017  9:42 AM  Temp    Pulse 89 11/09/2017  9:43 AM  Resp 18 11/09/2017  9:43 AM  SpO2 100 % 11/09/2017  9:43 AM  Vitals shown include unvalidated device data.  Last Pain:  Vitals:   11/09/17 0643  PainSc: 0-No pain      Patients Stated Pain Goal: 3 (21/03/12 8118)  Complications: No apparent anesthesia complications

## 2017-11-09 NOTE — Op Note (Signed)
NAME: Pursley, Miranda F. MEDICAL RECORD EA:[id number] ACCOUNT [id number] DATE OF BIRTH:1945-09-01 FACILITY: [facility] LOCATION: [place], [place]  OPERATIVE REPORT  DATE OF PROCEDURE:  11/09/2017  PREOPERATIVE DIAGNOSIS:  End-stage osteoarthritis, left hip.  POSTOPERATIVE DIAGNOSIS:  End-stage osteoarthritis, left hip.  PROCEDURE:  Left total hip replacement.  SURGEON:  Joni Fears, MD  ASSISTANT:  Biagio Borg, PA-C  ANESTHESIA:  IV sedation and spinal.  COMPLICATIONS:  None.  COMPONENTS:  DePuy AML large stature 13.5 mm femoral component, a +5 mm neck length, 36 mm outer diameter metallic hip ball, 52 mm outer diameter metallic acetabular component with a single 30 mm acetabular screw, a 52 mm outer diameter polyethylene  bearing +4 with a 10-degree posterior lip and a central hole eliminator.  DESCRIPTION OF PROCEDURE:  Ms. Knodel was met with her husband in the holding area and identified the left hip as the appropriate operative site and marked it accordingly.  The patient was then transported to room #7.  Anesthesia performed a spinal  anesthetic without problem.  The patient was carefully placed on the operating table in the supine position.  Nursing staff inserted a Foley catheter.  Urine was clear.  The patient was then placed carefully in the lateral decubitus position with the left side up and secured to the operating table with the Innomed hip system.  The left lower extremity was then prepped with chlorhexidine scrub and DuraPrep x2 from the iliac crest about the mid leg.  Sterile draping was performed.  A timeout was called.  A routine Southern incision was utilized and via sharp dissection carried down through subcutaneous tissue.  There was abundant adipose tissue that was carefully incised and retracted.  The IT band was identified and incised.  Manual dissection of the  muscle was then performed.  The short external rotators were  identified and carefully incised from their attachment to the posterior aspect of the greater trochanter.  Tendinous structures were tagged.  Retractors were placed more deeply.  Care was taken  not to the damage the sciatic nerve, which was carefully and routinely palpated throughout the procedure.  The hip capsule was then incised.  There was a small hip effusion.  I could easily dislocate the head posteriorly.  The head was misshapen with large areas of articular cartilage loss.  Using the calcar guide, the head was then osteotomized at the head and neck junction.  A center hole was then made in the piriformis fossa, and reaming was performed sequentially to 13 mm to accept a 13.5 component.  Rasping was performed sequentially to a large stature 13.5 component.  The neck cut was about a fingerbreadth proximal to  the lesser trochanter.  Retractors were then carefully placed about the acetabulum.  The labrum was sharply excised.  There was a nice capsule.  Reaming was performed sequentially to 51 mm to accept a 52 component.  Nice bleeding throughout the acetabulum.  I trialed the 50 and a 52 component.  I had a nice rim fit on the 52, but it would not completely seat.  The final Gription 3 acetabular  component was then impacted with a very nice fit using the external guide.  I then inserted the trial polyethylene component and then the 13.5 large stature femoral component.  I trialed several neck lengths and felt that the +5 reestablished leg  lengths.  It was perfectly stable.  The trial components were then removed, and the acetabulum was irrigated with saline solution.  I inserted a single acetabular screw after appropriate drilling and measuring.  The screw length was 30 mm.  Apex hole eliminator was inserted followed by the Marathon polyethylene liner +4 with a 10 degree posterior lip.  We then carefully impacted the 13.5 femoral component onto the calcar.  We again trialed a +5 neck  length and felt that it was the appropriate size.  This was removed after dislocating the hip.  The joint was again irrigated with saline solution.  The  final metallic hip ball +97 with a 5 mm neck length was then impacted after cleaning the [facility] taper neck.  The acetabulum was inspected and cleared of any loose material.  The head was then reduced.  Through a full range of motion, we could not  dislocate the hip.  We then injected the deep capsule with 0.25% Marcaine with epinephrine.  We applied topical tranexamic acid under compression for over 5 minutes.  We had a nice dry field.  The capsule was then closed anatomically with a running 0 Ethibond.  The short external rotators closed with a similar material.  The IT band was closed with a running #1 Vicryl.  The subcu was closed in several layers with 0 and 2-0 Vicryl and then 3-0  Monocryl.  Skin closed with skin clips.  A sterile bulky dressing was applied.  The patient was then placed supine on the operating table and transferred to the operating room stretcher without complications.  LN/NUANCE  D:11/09/2017 T:11/09/2017 JOB:[id number]

## 2017-11-09 NOTE — Progress Notes (Signed)
Orthopedic Tech Progress Note Patient Details:  Miranda Romero 02/12/1946 [id number]  Patient ID: Miranda Romero, female   DOB: 02/12/1946, 72 y.o.   MRN: [id number]   Nikki DomCrawford, Rane Dumm Pt stated that she does not need trapeze bar patient helper; RN notified

## 2017-11-09 NOTE — Anesthesia Postprocedure Evaluation (Signed)
Anesthesia Post Note  Patient: [place] Moncure  Procedure(s) Performed: LEFT TOTAL HIP ARTHROPLASTY (Left )     Patient location during evaluation: PACU Anesthesia Type: Spinal Level of consciousness: oriented and awake and alert Pain management: pain level controlled Vital Signs Assessment: post-procedure vital signs reviewed and stable Respiratory status: spontaneous breathing, respiratory function stable and patient connected to nasal cannula oxygen Cardiovascular status: blood pressure returned to baseline and stable Postop Assessment: no headache, no backache and no apparent nausea or vomiting Anesthetic complications: no    Last Vitals:  Vitals:   11/09/17 1202 11/09/17 1608  BP: 126/82 127/66  Pulse: 72 82  Resp:  16  Temp: 36.6 C 36.6 C  SpO2: 98% 91%    Last Pain:  Vitals:   11/09/17 1758  TempSrc:   PainSc: 0-No pain                 Trevor IhaStephen A Houser

## 2017-11-10 ENCOUNTER — Encounter (HOSPITAL_COMMUNITY): Payer: Self-pay | Admitting: Orthopaedic Surgery

## 2017-11-10 ENCOUNTER — Other Ambulatory Visit: Payer: Self-pay

## 2017-11-10 LAB — BASIC METABOLIC PANEL
Anion gap: 8 (ref 5–15)
BUN: 10 mg/dL (ref 8–23)
CALCIUM: 8.2 mg/dL — AB (ref 8.9–10.3)
CO2: 27 mmol/L (ref 22–32)
Chloride: 106 mmol/L (ref 98–111)
Creatinine, Ser: 0.79 mg/dL (ref 0.44–1.00)
GFR calc Af Amer: >60 mL/min (ref >60)
GLUCOSE: 129 mg/dL — AB (ref 70–99)
Potassium: 3.7 mmol/L (ref 3.5–5.1)
Sodium: 141 mmol/L (ref 135–145)

## 2017-11-10 LAB — CBC
HEMATOCRIT: 31.8 % — AB (ref 36.0–46.0)
Hemoglobin: 10.3 g/dL — ABNORMAL LOW (ref 12.0–15.0)
MCH: 31 pg (ref 26.0–34.0)
MCHC: 32.4 g/dL (ref 30.0–36.0)
MCV: 95.8 fL (ref 78.0–100.0)
PLATELETS: 228 10*3/uL (ref 150–400)
RBC: 3.32 MIL/uL — ABNORMAL LOW (ref 3.87–5.11)
RDW: 13.7 % (ref 11.5–15.5)
WBC: 6.6 10*3/uL (ref 4.0–10.5)

## 2017-11-10 NOTE — Evaluation (Signed)
Occupational Therapy Evaluation Patient Details Name: Miranda Romero MRN: [id number] DOB: [birth year] Today's Date: 11/10/2017    History of Present Illness Pt is a 72 y.o. female s/p elective L THA (posterior approach) on 11/09/17. PMH includes fibromyalgia, HTN, sleep apnea, and prior knee sx per pt.    Clinical Impression   PTA patient reports independent with ADLs (given increased time) and used SPC for mobility, limited by pain.  She currently requires setup assist for UB ADL seated, min assist for LB ADL, min guard for toilet transfers, and min guard for grooming standing. She has been educated on precautions (able to recall 3/3 with min verbal cueing), safety, mobility, ADL compensatory techniques, AE and DME. Patient will benefit from continued OT services while admitted in order to maximize safety and independence prior to returning home with spouses 24/7 support.  No follow up OT recommended.  Will continue to follow.      Follow Up Recommendations  No OT follow up;Supervision/Assistance - 24 hour    Equipment Recommendations  None recommended by OT    Recommendations for Other Services       Precautions / Restrictions Precautions Precautions: Posterior Hip;Fall Precaution Comments: pt able to recall 3/3 precautions with increased time and minimal cueing  Restrictions Weight Bearing Restrictions: Yes LLE Weight Bearing: Weight bearing as tolerated      Mobility Bed Mobility Overal bed mobility: Needs Assistance Bed Mobility: Supine to Sit     Supine to sit: Supervision     General bed mobility comments: seated OOB in recliner upon entry  Transfers Overall transfer level: Needs assistance Equipment used: Rolling walker (2 wheeled) Transfers: Sit to/from Stand Sit to Stand: Min guard         General transfer comment: good recall of hand placement, verbal cueing for L LE extension to maintain hip precautions during transitions     Balance Overall balance  assessment: Needs assistance Sitting-balance support: No upper extremity supported;Feet supported Sitting balance-Leahy Scale: Good     Standing balance support: No upper extremity supported;During functional activity Standing balance-Leahy Scale: Fair Standing balance comment: no UE support while washing hands, min guard for safety                           ADL either performed or assessed with clinical judgement   ADL Overall ADL's : Needs assistance/impaired     Grooming: Min guard;Standing   Upper Body Bathing: Set up;Sitting   Lower Body Bathing: Minimal assistance;With adaptive equipment;Sit to/from stand Lower Body Bathing Details (indicate cue type and reason): educated on using long handled sponge for LB bathing, having spouse assist as needed Upper Body Dressing : Set up;Sitting   Lower Body Dressing: Minimal assistance;Sit to/from stand;Cueing for compensatory techniques;With adaptive equipment;Cueing for safety Lower Body Dressing Details (indicate cue type and reason): educated on techniques and AE for hip precautions, min cueing to recall use of AE and min guard in standing  Toilet Transfer: Min guard;Ambulation;BSC;RW(3:1 over toilet)   Toileting- Clothing Manipulation and Hygiene: Min guard;Sit to/from stand       Functional mobility during ADLs: Min guard;Rolling walker General ADL Comments: increased time and effort for all activities     Vision   Vision Assessment?: No apparent visual deficits     Perception     Praxis      Pertinent Vitals/Pain Pain Assessment: Faces Faces Pain Scale: Hurts little more Pain Location: L hip  Pain Descriptors /  Indicators: Aching;Operative site guarding Pain Intervention(s): Limited activity within patient's tolerance;Repositioned     Hand Dominance     Extremity/Trunk Assessment Upper Extremity Assessment Upper Extremity Assessment: Overall WFL for tasks assessed   Lower Extremity  Assessment Lower Extremity Assessment: Defer to PT evaluation LLE Deficits / Details: s/p hip sx    Cervical / Trunk Assessment Cervical / Trunk Assessment: Normal   Communication Communication Communication: No difficulties   Cognition Arousal/Alertness: Awake/alert Behavior During Therapy: WFL for tasks assessed/performed Overall Cognitive Status: Within Functional Limits for tasks assessed                                     General Comments  husband present and supportive       Shoulder Instructions      Home Living Family/patient expects to be discharged to:: Private residence Living Arrangements: Spouse/significant other Available Help at Discharge: Family;Available 24 hours/day Type of Home: House Home Access: Stairs to enter Entergy CorporationEntrance Stairs-Number of Steps: 3 Entrance Stairs-Rails: Right Home Layout: One level     Bathroom Shower/Tub: Producer, television/film/videoWalk-in shower   Bathroom Toilet: Handicapped height     Home Equipment: Bedside commode;Walker - standard;Cane - single point          Prior Functioning/Environment Level of Independence: Independent with assistive device(s)        Comments: using SPC for mobility, able to complete ADLs with increased time         OT Problem List: Decreased activity tolerance;Impaired balance (sitting and/or standing);Decreased safety awareness;Decreased knowledge of use of DME or AE;Decreased knowledge of precautions;Pain      OT Treatment/Interventions: Self-care/ADL training;Energy conservation;DME and/or AE instruction;Therapeutic activities;Patient/family education;Balance training    OT Goals(Current goals can be found in the care plan section) Acute Rehab OT Goals Patient Stated Goal: to be able to get back to sewing OT Goal Formulation: With patient Time For Goal Achievement: 11/24/17 Potential to Achieve Goals: Good  OT Frequency: Min 2X/week   Barriers to D/C:            Co-evaluation               AM-PAC PT "6 Clicks" Daily Activity     Outcome Measure Help from another person eating meals?: None Help from another person taking care of personal grooming?: A Little Help from another person toileting, which includes using toliet, bedpan, or urinal?: A Little Help from another person bathing (including washing, rinsing, drying)?: A Little Help from another person to put on and taking off regular upper body clothing?: None Help from another person to put on and taking off regular lower body clothing?: A Little 6 Click Score: 20   End of Session Equipment Utilized During Treatment: Gait belt;Rolling walker  Activity Tolerance: Patient tolerated treatment well Patient left: in chair;with call bell/phone within reach;with family/visitor present  OT Visit Diagnosis: Other abnormalities of gait and mobility (R26.89);Pain Pain - Right/Left: Left Pain - part of body: Hip                Time: [id number] OT Time Calculation (min): 31 min Charges:  OT General Charges $OT Visit: 1 Visit OT Evaluation $OT Eval Moderate Complexity: 1 Mod OT Treatments $Self Care/Home Management : 8-22 mins  Chancy Milroyhristie S Ahlivia Salahuddin, OTR/L  Pager [phone]   Chancy MilroyChristie S Safal Halderman 11/10/2017, 1:44 PM

## 2017-11-10 NOTE — Progress Notes (Addendum)
Physical Therapy Treatment & Discharge Patient Details Name: Miranda Romero MRN: [id number] DOB: Nov 21, 1945 Today's Date: 11/10/2017    History of Present Illness Pt is a 72 y.o. female s/p elective L THA (posterior approach) on 11/09/17. PMH includes fibromyalgia, HTN, sleep apnea, and prior knee sx per pt.    PT Comments    Pt progressing well with mobility. Ambulating with RW and supervision. Simulated car transfer to ensure pt will be able to get in and out of raised height passenger seat when discharging home. Pt has met short-term acute PT goals. Pt and husband have no further questions or concerns. Encouraged continued ambulation with supervision from family and/or nursing staff during hospital admission (RN aware). Husband will be present to provide support upon return home. Recommend follow-up with OP PT services. Will d/c acute PT.     Follow Up Recommendations  Follow surgeon's recommendation for DC plan and follow-up therapies;Supervision for mobility/OOB     Equipment Recommendations  Rolling walker with 5" wheels;3in1 (PT)    Recommendations for Other Services       Precautions / Restrictions Precautions Precautions: Posterior Hip;Fall Precaution Comments: pt able to recall 3/3 precautions with increased time and minimal cueing  Restrictions Weight Bearing Restrictions: Yes LLE Weight Bearing: Weight bearing as tolerated    Mobility  Bed Mobility Overal bed mobility: Modified Independent Bed Mobility: Supine to Sit;Sit to Supine     Supine to sit: Modified independent (Device/Increase time)     General bed mobility comments: Increased time and effort, but no physical assist required  Transfers Overall transfer level: Modified independent Equipment used: Rolling walker (2 wheeled) Transfers: Sit to/from Stand Sit to Stand: Modified independent (Device/Increase time)         General transfer comment: Simulated transferring into/out of higher car seat;  cues for technique/set-up. Supervision for safety with this. Pt mod indep sit<>stand with RW from normal surface heights  Ambulation/Gait Ambulation/Gait assistance: Supervision Gait Distance (Feet): 500 Feet Assistive device: Rolling walker (2 wheeled) Gait Pattern/deviations: Step-through pattern;Decreased stride length;Decreased weight shift to left Gait velocity: Decreased Gait velocity interpretation: 1.31 - 2.62 ft/sec, indicative of limited community Metallurgist Rankin (Stroke Patients Only)       Balance Overall balance assessment: Needs assistance Sitting-balance support: No upper extremity supported;Feet supported Sitting balance-Leahy Scale: Good     Standing balance support: No upper extremity supported;During functional activity Standing balance-Leahy Scale: Fair Standing balance comment: no UE support while washing hands, min guard for safety                            Cognition Arousal/Alertness: Awake/alert Behavior During Therapy: WFL for tasks assessed/performed Overall Cognitive Status: Within Functional Limits for tasks assessed                                        Exercises      General Comments General comments (skin integrity, edema, etc.): Husband present.       Pertinent Vitals/Pain Pain Assessment: Faces Faces Pain Scale: Hurts little more Pain Location: L hip  Pain Descriptors / Indicators: Aching;Operative site guarding Pain Intervention(s): Monitored during session;Limited activity within patient's tolerance    Home Living Family/patient expects to be discharged to::  Private residence Living Arrangements: Spouse/significant other Available Help at Discharge: Family;Available 24 hours/day Type of Home: House Home Access: Stairs to enter Entrance Stairs-Rails: Right Home Layout: One level Home Equipment: Bedside commode;Walker - standard;Cane  - single point      Prior Function Level of Independence: Independent with assistive device(s)      Comments: using SPC for mobility, able to complete ADLs with increased time    PT Goals (current goals can now be found in the care plan section) Acute Rehab PT Goals Patient Stated Goal: to be able to get back to sewing Progress towards PT goals: Goals met/education completed, patient discharged from PT    Frequency    7X/week      PT Plan Current plan remains appropriate    Co-evaluation              AM-PAC PT "6 Clicks" Daily Activity  Outcome Measure  Difficulty turning over in bed (including adjusting bedclothes, sheets and blankets)?: None Difficulty moving from lying on back to sitting on the side of the bed? : None Difficulty sitting down on and standing up from a chair with arms (e.g., wheelchair, bedside commode, etc,.)?: None Help needed moving to and from a bed to chair (including a wheelchair)?: A Little Help needed walking in hospital room?: A Little Help needed climbing 3-5 steps with a railing? : A Little 6 Click Score: 21    End of Session Equipment Utilized During Treatment: Gait belt Activity Tolerance: Patient tolerated treatment well Patient left: with family/visitor present;in bed;with call bell/phone within reach;with nursing/sitter in room Nurse Communication: Mobility status PT Visit Diagnosis: Muscle weakness (generalized) (M62.81);Other abnormalities of gait and mobility (R26.89);Unsteadiness on feet (R26.81);Pain Pain - Right/Left: Left Pain - part of body: Hip     Time: 1594-5859 PT Time Calculation (min) (ACUTE ONLY): 25 min  Charges:  $Gait Training: 8-22 mins $Therapeutic Activity: 8-22 mins                    Mabeline Caras, PT, DPT Acute Rehab Services  Pager: [place] 11/10/2017, 2:29 PM

## 2017-11-10 NOTE — Progress Notes (Signed)
Physical Therapy Treatment Patient Details Name: Miranda Romero MRN: [id number] DOB: 1945-05-22 Today's Date: 11/10/2017    History of Present Illness Pt is a 72 y.o. female s/p elective L THA (posterior approach) on 11/09/17. PMH includes fibromyalgia, HTN, sleep apnea, and prior knee sx per pt.    PT Comments    Pt progressing well with mobility. Amb further distance and initiated stair training with min guard for balance. Reviewed precautions, positioning, and therex. Will plan for car transfer training next session.   Follow Up Recommendations  Follow surgeon's recommendation for DC plan and follow-up therapies;Supervision for mobility/OOB     Equipment Recommendations  Rolling walker with 5" wheels;3in1 (PT)    Recommendations for Other Services       Precautions / Restrictions Precautions Precautions: Posterior Hip;Fall Precaution Comments: Pt able to recall 1/3 precautions prior to session. Husband able to recall 3/3 Restrictions Weight Bearing Restrictions: Yes LLE Weight Bearing: Weight bearing as tolerated    Mobility  Bed Mobility Overal bed mobility: Needs Assistance Bed Mobility: Supine to Sit     Supine to sit: Supervision     General bed mobility comments: Supervision with bed flat  Transfers Overall transfer level: Needs assistance Equipment used: Rolling walker (2 wheeled) Transfers: Sit to/from Stand Sit to Stand: Min guard         General transfer comment: Good technique standing from bed and toilet  Ambulation/Gait Ambulation/Gait assistance: Min guard Gait Distance (Feet): 150 Feet Assistive device: Rolling walker (2 wheeled) Gait Pattern/deviations: Step-through pattern;Decreased stride length;Decreased weight shift to left Gait velocity: Decreased Gait velocity interpretation: <1.8 ft/sec, indicate of risk for recurrent falls General Gait Details: Improved amb speed this session. Cues for heel-to-toe gait pattern and sequencing with  RW   Stairs Stairs: Yes Stairs assistance: Min guard Stair Management: One rail Right Number of Stairs: 4 General stair comments: Ascend/descended 4 steps with single UE support on rail; min guard for balance. Husband present for education   Wheelchair Mobility    Modified Rankin (Stroke Patients Only)       Balance Overall balance assessment: Needs assistance Sitting-balance support: No upper extremity supported;Feet supported Sitting balance-Leahy Scale: Good     Standing balance support: Bilateral upper extremity supported;During functional activity;Single extremity supported Standing balance-Leahy Scale: Fair Standing balance comment: Can static stand with no UE support; dynamic stability requires UE support                            Cognition Arousal/Alertness: Awake/alert Behavior During Therapy: WFL for tasks assessed/performed Overall Cognitive Status: Within Functional Limits for tasks assessed                                        Exercises General Exercises - Lower Extremity Long Arc Quad: AROM;Both;10 reps;Seated    General Comments General comments (skin integrity, edema, etc.): Husband present during session      Pertinent Vitals/Pain Pain Assessment: No/denies pain    Home Living                      Prior Function            PT Goals (current goals can now be found in the care plan section) Acute Rehab PT Goals Patient Stated Goal: to be able to walk more  PT Goal Formulation: With  patient Time For Goal Achievement: 11/23/17 Potential to Achieve Goals: Good Progress towards PT goals: Progressing toward goals    Frequency    7X/week      PT Plan Current plan remains appropriate    Co-evaluation              AM-PAC PT "6 Clicks" Daily Activity  Outcome Measure  Difficulty turning over in bed (including adjusting bedclothes, sheets and blankets)?: None Difficulty moving from lying on  back to sitting on the side of the bed? : A Little Difficulty sitting down on and standing up from a chair with arms (e.g., wheelchair, bedside commode, etc,.)?: Unable Help needed moving to and from a bed to chair (including a wheelchair)?: A Little Help needed walking in hospital room?: A Little Help needed climbing 3-5 steps with a railing? : A Little 6 Click Score: 17    End of Session Equipment Utilized During Treatment: Gait belt Activity Tolerance: Patient tolerated treatment well Patient left: in chair;with call bell/phone within reach;with family/visitor present Nurse Communication: Mobility status PT Visit Diagnosis: Muscle weakness (generalized) (M62.81);Other abnormalities of gait and mobility (R26.89);Unsteadiness on feet (R26.81);Pain Pain - Right/Left: Left Pain - part of body: Hip     Time: 0912-0936 PT Time Calculation (min) (ACUTE ONLY): 24 min  Charges:  $Gait Training: 23-37 mins                     Ina Homes, PT, DPT Acute Rehab Services  Pager: [phone]  Malachy Chamber 11/10/2017, 9:55 AM

## 2017-11-10 NOTE — Plan of Care (Signed)

## 2017-11-10 NOTE — Progress Notes (Signed)
PATIENT ID: Miranda Romero        MRN:  [id number]          DOB/AGE: 1945/06/28 / 72 y.o.    Norlene Campbell, MD   Jacqualine Code, PA-C [address] [place], [place]  [address]                             [phone]   PROGRESS NOTE  Subjective:  negative for Chest Pain  negative for Shortness of Breath  negative for Nausea/Vomiting   negative for Calf Pain    Tolerating Diet: yes         Patient reports pain as moderate.     Comfortable night  Objective: Vital signs in last 24 hours:    Patient Vitals for the past 24 hrs:  BP Temp Temp src Pulse Resp SpO2  11/10/17 0442 128/68 98 F (36.7 C) Oral 82 16 98 %  11/10/17 0016 119/73 99.6 F (37.6 C) Oral 94 16 96 %  11/09/17 2203 137/70 99.6 F (37.6 C) Oral 74 16 96 %  11/09/17 1608 127/66 97.9 F (36.6 C) Oral 82 16 91 %  11/09/17 1202 126/82 97.8 F (36.6 C) - 72 - 98 %  11/09/17 1145 126/77 - - 65 12 100 %  11/09/17 1130 123/71 - - 70 14 99 %  11/09/17 1115 102/86 - - 76 18 100 %  11/09/17 1100 133/63 - - 75 17 98 %  11/09/17 1045 126/71 - - 77 15 100 %  11/09/17 1030 123/82 - - 83 20 99 %  11/09/17 1015 133/74 - - 76 19 100 %  11/09/17 1000 - - - 77 (!) 23 99 %  11/09/17 0957 121/86 - - 85 18 93 %  11/09/17 0945 - - - 81 15 99 %  11/09/17 0942 120/88 (!) 97.3 F (36.3 C) - 92 16 100 %      Intake/Output from previous day:   08/27 0701 - 08/28 0700 In: 2353.5 [P.O.:60; I.V.:1903.7] Out: 1750 [Urine:1200]   Intake/Output this shift:   No intake/output data recorded.   Intake/Output      08/27 0701 - 08/28 0700 08/28 0701 - 08/29 0700   P.O. 60    I.V. 1903.7    IV Piggyback 389.8    Total Intake 2353.5    Urine 1200    Blood 550    Total Output 1750    Net +603.5            LABORATORY DATA: Recent Labs    11/09/17 0631 11/10/17 0410  WBC 7.1 6.6  HGB 12.9 10.3*  HCT 39.6 31.8*  PLT 289 228   Recent Labs    11/09/17 0631 11/10/17 0410  NA 142 141  K 3.5 3.7  CL 105 106  CO2  26 27  BUN 15 10  CREATININE 0.91 0.79  GLUCOSE 114* 129*  CALCIUM 9.4 8.2*   Lab Results  Component Value Date   INR 0.99 11/09/2017   INR 1.07 05/07/2010    Recent Radiographic Studies :  Dg Chest 2 View  Result Date: 11/09/2017 CLINICAL DATA:  72 year old female with preop for hip replacement. EXAM: CHEST - 2 VIEW COMPARISON:  Chest radiograph dated 11/04/2015 FINDINGS: The heart size and mediastinal contours are within normal limits. Both lungs are clear. The visualized skeletal structures are unremarkable. IMPRESSION: No active cardiopulmonary disease. Electronically Signed   By: Elgie Collard  M.D.   On: 11/09/2017 06:45   Dg Hip Port Unilat With Pelvis 1v Left  Result Date: 11/09/2017 CLINICAL DATA:  Total left hip arthroplasty EXAM: DG HIP (WITH OR WITHOUT PELVIS) 1V PORT LEFT COMPARISON:  None. FINDINGS: Two views of a new left hip arthroplasty show location and no convincing periprosthetic fracture (an incomplete lucency over the greater trochanter is likely soft tissue gas, as seen more superior to the greater trochanter). IMPRESSION: Left hip arthroplasty without complicating feature. Electronically Signed   By: Marnee SpringJonathon  Watts M.D.   On: 11/09/2017 10:48     Examination:  General appearance: alert, cooperative and no distress  Wound Exam: clean, dry, intact   Drainage:  Scant/small amount Serosanguinous exudate  Motor Exam: EHL, FHL, Anterior Tibial and Posterior Tibial Intact  Sensory Exam: Superficial Peroneal, Deep Peroneal and Tibial normal  Vascular Exam: Normal  Assessment:    1 Day Post-Op  Procedure(s) (LRB): LEFT TOTAL HIP ARTHROPLASTY (Left)  ADDITIONAL DIAGNOSIS:  Active Problems:   Osteoarthritis of left hip   S/P hip replacement, left  Acute Blood Loss Anemia-mild and asymptomatic   Plan: Physical Therapy as ordered Weight Bearing as Tolerated (WBAT)  DVT Prophylaxis:  Aspirin, Foot Pumps and TED hose  DISCHARGE PLAN: Home  DISCHARGE  NEEDS: HHPT, Walker and 3-in-1 comode seat  Doing well-comfortable with present meds. OOB with PT, foley out, hope for D/C in am   Jacqualine CodeBrian Petrarca, Cordelia Poche-C [facility]  11/10/2017 7:51 AM  Patient ID: Miranda Romero, female   DOB: 08/08/1945, 72 y.o.   MRN: [id number]

## 2017-11-11 LAB — BASIC METABOLIC PANEL
ANION GAP: 7 (ref 5–15)
BUN: 10 mg/dL (ref 8–23)
CHLORIDE: 106 mmol/L (ref 98–111)
CO2: 28 mmol/L (ref 22–32)
Calcium: 8.3 mg/dL — ABNORMAL LOW (ref 8.9–10.3)
Creatinine, Ser: 0.73 mg/dL (ref 0.44–1.00)
GFR calc Af Amer: >60 mL/min (ref >60)
GFR calc non Af Amer: >60 mL/min (ref >60)
GLUCOSE: 122 mg/dL — AB (ref 70–99)
POTASSIUM: 3.6 mmol/L (ref 3.5–5.1)
Sodium: 141 mmol/L (ref 135–145)

## 2017-11-11 LAB — CBC
HEMATOCRIT: 29.6 % — AB (ref 36.0–46.0)
Hemoglobin: 9.6 g/dL — ABNORMAL LOW (ref 12.0–15.0)
MCH: 31 pg (ref 26.0–34.0)
MCHC: 32.4 g/dL (ref 30.0–36.0)
MCV: 95.5 fL (ref 78.0–100.0)
Platelets: 214 10*3/uL (ref 150–400)
RBC: 3.1 MIL/uL — AB (ref 3.87–5.11)
RDW: 14 % (ref 11.5–15.5)
WBC: 9.3 10*3/uL (ref 4.0–10.5)

## 2017-11-11 MED ORDER — OXYCODONE HCL 5 MG PO TABS: 5.0000 mg | ORAL_TABLET | ORAL | 0 refills | Status: DC | PRN

## 2017-11-11 MED ORDER — ACETAMINOPHEN 325 MG PO TABS: 325.0000 mg | ORAL_TABLET | Freq: Four times a day (QID) | ORAL | Status: DC | PRN

## 2017-11-11 MED ORDER — ASPIRIN 81 MG PO CHEW: 81.0000 mg | CHEWABLE_TABLET | Freq: Two times a day (BID) | ORAL | Status: AC

## 2017-11-11 MED ORDER — METHOCARBAMOL 500 MG PO TABS: 500.0000 mg | ORAL_TABLET | Freq: Three times a day (TID) | ORAL | 0 refills | Status: DC | PRN

## 2017-11-11 NOTE — Progress Notes (Signed)
Occupational Therapy Treatment and Discharge  Patient Details Name: Miranda Romero MRN: [id number] DOB: Feb 11, 1946 Today's Date: 11/11/2017    History of present illness Pt is a 72 y.o. female s/p elective L THA (posterior approach) on 11/09/17. PMH includes fibromyalgia, HTN, sleep apnea, and prior knee sx per pt.    OT comments  Patient progressing well.  Recalled and adhered to hip precautions throughout session without cueing.  Demonstrates ability to complete toilet transfers with modified independence and simulated shower transfers with min guard for safety.  Patient's spouse is supportive and plans to assist as needed.  They purchased AE Hip Kit yesterday, and verbalize understanding of use.  At this time, patient and spouse have all education needed and all goals have been met (or spouse is able to assist with tasks).  OT signing off.    Follow Up Recommendations  No OT follow up;Supervision/Assistance - 24 hour    Equipment Recommendations  None recommended by OT    Recommendations for Other Services      Precautions / Restrictions Precautions Precautions: Posterior Hip;Fall Precaution Comments: pt able to recall 3/3 precautions  Restrictions Weight Bearing Restrictions: Yes LLE Weight Bearing: Weight bearing as tolerated       Mobility Bed Mobility               General bed mobility comments: seated OOB in recliner upon entry  Transfers Overall transfer level: Modified independent Equipment used: Rolling walker (2 wheeled) Transfers: Sit to/from Stand Sit to Stand: Modified independent (Device/Increase time)         General transfer comment: good recall of technique, precautions and safety     Balance Overall balance assessment: Needs assistance Sitting-balance support: No upper extremity supported;Feet supported Sitting balance-Leahy Scale: Good     Standing balance support: No upper extremity supported;During functional activity Standing  balance-Leahy Scale: Fair Standing balance comment: supervision for safety, within BOS                           ADL either performed or assessed with clinical judgement   ADL Overall ADL's : Needs assistance/impaired             Lower Body Bathing: Supervison/ safety;With adaptive equipment;Sit to/from stand Lower Body Bathing Details (indicate cue type and reason): reviewed safety, using AE and having spouse assist as needed     Lower Body Dressing: Minimal assistance;Sit to/from stand;Cueing for compensatory techniques;With adaptive equipment;Cueing for safety Lower Body Dressing Details (indicate cue type and reason): reviewed compensatory techniques and safety with LB dressing, using AE as needed for precautions  Toilet Transfer: Ambulation;RW;Modified Independent(simulated to recliner )       Tub/ Shower Transfer: Walk-in shower;Min guard;Ambulation;3 in 1;Rolling walker;Cueing for safety Tub/Shower Transfer Details (indicate cue type and reason): spouse able to assist, educated on reverse step over threshold with RW; min guard for safety Functional mobility during ADLs: Rolling walker;Supervision/safety General ADL Comments: increased time and effort, good recall and adherance to precautions      Vision       Perception     Praxis      Cognition Arousal/Alertness: Awake/alert Behavior During Therapy: WFL for tasks assessed/performed Overall Cognitive Status: Within Functional Limits for tasks assessed  Exercises     Shoulder Instructions       General Comments spouse present     Pertinent Vitals/ Pain       Pain Assessment: Faces Faces Pain Scale: Hurts little more Pain Location: L hip  Pain Descriptors / Indicators: Aching;Operative site guarding Pain Intervention(s): Limited activity within patient's tolerance;Repositioned  Home Living                                           Prior Functioning/Environment              Frequency  Min 2X/week        Progress Toward Goals  OT Goals(current goals can now be found in the care plan section)  Progress towards OT goals: Goals met/education completed, patient discharged from OT  Acute Rehab OT Goals Patient Stated Goal: to be able to get back to sewing OT Goal Formulation: With patient Time For Goal Achievement: 11/24/17 Potential to Achieve Goals: Good ADL Goals Pt Will Perform Grooming: with modified independence Pt Will Perform Lower Body Dressing: with caregiver independent in assisting;with min assist;with adaptive equipment Pt Will Transfer to Toilet: with modified independence(RW) Pt Will Perform Tub/Shower Transfer: with min guard assist;3 in 1;rolling walker;ambulating;Shower transfer  Plan Discharge plan remains appropriate;Frequency remains appropriate    Co-evaluation                 AM-PAC PT "6 Clicks" Daily Activity     Outcome Measure   Help from another person eating meals?: None Help from another person taking care of personal grooming?: None Help from another person toileting, which includes using toliet, bedpan, or urinal?: None Help from another person bathing (including washing, rinsing, drying)?: A Little Help from another person to put on and taking off regular upper body clothing?: None Help from another person to put on and taking off regular lower body clothing?: A Little 6 Click Score: 22    End of Session Equipment Utilized During Treatment: Gait belt;Rolling walker  OT Visit Diagnosis: Other abnormalities of gait and mobility (R26.89);Pain Pain - Right/Left: Left Pain - part of body: Hip   Activity Tolerance Patient tolerated treatment well   Patient Left in chair;with call bell/phone within reach;with family/visitor present   Nurse Communication Mobility status        Time: 1010-1022 OT Time Calculation (min): 12 min  Charges: OT  General Charges $OT Visit: 1 Visit OT Treatments $Self Care/Home Management : 8-22 mins  Delight Stare, OTR/L  Pager [place] 11/11/2017, 10:32 AM

## 2017-11-11 NOTE — Discharge Summary (Signed)
Miranda Campbell, MD   Jacqualine Code, PA-C [address], [place], [place]  [address]                             [phone]  PATIENT ID: Miranda Romero        MRN:  [id number]          DOB/AGE: 73-20-1947 / 72 y.o.    DISCHARGE SUMMARY  ADMISSION DATE:    11/09/2017 DISCHARGE DATE:   11/11/2017   ADMISSION DIAGNOSIS: LEFT HIP OSTEOARTHRITIS    DISCHARGE DIAGNOSIS:  LEFT HIP OSTEOARTHRITIS    ADDITIONAL DIAGNOSIS: Active Problems:   Osteoarthritis of left hip   S/P hip replacement, left  Past Medical History:  Diagnosis Date  . Arthritis   . Blood transfusion   . Fibromyalgia    Dr Corliss Skains  . GERD (gastroesophageal reflux disease)   . Hiatal hernia with gastroesophageal reflux 11/06/2015  . History of hiatal hernia   . Hyperlipidemia   . Hypertension    Dr Wylene Simmer LOV 4/13 on chart  . Seasonal allergies   . Sleep apnea    SEVERE  per sleep study 3/12 EPIC no CPAP   . Transfusion history    3 yrs ago s/p surgery for knee  . Umbilical hernia   . Weight decrease     PROCEDURE: Procedure(s): LEFT TOTAL HIP ARTHROPLASTY  on 11/09/2017  CONSULTS: none    HISTORY: Miranda F Luca, 72 y.o. female, has a history of pain and functional disability in the left hip(s) due to arthritis and patient has failed non-surgical conservative treatments for greater than 12 weeks to include NSAID's and/or analgesics, corticosteriod injections, flexibility and strengthening excercises, supervised PT with diminished ADL's post treatment, use of assistive devices, weight reduction as appropriate and activity modification.  Onset of symptoms was gradual starting 6 years ago with gradually worsening course since that time.The patient noted no past surgery on the left hip(s).  Patient currently rates pain in the left hip at 8 out of 10 with activity. Patient has night pain, worsening of pain with activity and weight bearing, trendelenberg gait, pain that interfers with activities of  daily living and pain with passive range of motion. Patient has evidence of subchondral cysts, subchondral sclerosis and periarticular osteophytes by imaging studies. This condition presents safety issues increasing the risk of falls.There is no current active infection.  HOSPITAL COURSE:  Miranda F Dudash is a 72 y.o. admitted on 11/09/2017 and found to have a diagnosis of LEFT HIP OSTEOARTHRITIS.  After appropriate laboratory studies were obtained  they were taken to the operating room on 11/09/2017 and underwent  Procedure(s): LEFT TOTAL HIP ARTHROPLASTY  .   They were given perioperative antibiotics:  Anti-infectives (From admission, onward)   Start     Dose/Rate Route Frequency Ordered Stop   11/09/17 1400  ceFAZolin (ANCEF) IVPB 1 g/50 mL premix     1 g 100 mL/hr over 30 Minutes Intravenous Every 6 hours 11/09/17 1201 11/10/17 0849   11/09/17 0630  ceFAZolin (ANCEF) IVPB 2g/100 mL premix     2 g 200 mL/hr over 30 Minutes Intravenous To [facility] 11/08/17 1044 11/09/17 0749    .  Tolerated the procedure well.  Placed with a foley intraoperatively.     Toradol was given post op.  POD #1, allowed out of bed to a chair.  PT for ambulation and exercise program.  Foley D/C'd in morning.  IV saline locked.  O2 discontionued.  POD #2, continued PT and ambulation.  .  The remainder of the hospital course was dedicated to ambulation and strengthening.   The patient was discharged on 2 Days Post-Op in  Stable condition.  Blood products given:none  DIAGNOSTIC STUDIES: Recent vital signs:  Patient Vitals for the past 24 hrs:  BP Temp Temp src Pulse Resp SpO2 Height Weight  11/11/17 0500 123/65 98.4 F (36.9 C) Oral 90 18 99 % - -  11/11/17 0254 - - - - - - 5\' 5"  (1.651 m) 99.3 kg  11/10/17 2015 114/68 98.2 F (36.8 C) Oral 98 18 98 % - -  11/10/17 1401 122/60 99 F (37.2 C) Oral 69 17 92 % - -       Recent laboratory studies: Recent Labs    11/09/17 0631 11/10/17 0410  11/11/17 0523  WBC 7.1 6.6 9.3  HGB 12.9 10.3* 9.6*  HCT 39.6 31.8* 29.6*  PLT 289 228 214   Recent Labs    11/09/17 0631 11/10/17 0410 11/11/17 0523  NA 142 141 141  K 3.5 3.7 3.6  CL 105 106 106  CO2 26 27 28   BUN 15 10 10   CREATININE 0.91 0.79 0.73  GLUCOSE 114* 129* 122*  CALCIUM 9.4 8.2* 8.3*   Lab Results  Component Value Date   INR 0.99 11/09/2017   INR 1.07 05/07/2010     Recent Radiographic Studies :  Dg Chest 2 View  Result Date: 11/09/2017 CLINICAL DATA:  72 year old female with preop for hip replacement. EXAM: CHEST - 2 VIEW COMPARISON:  Chest radiograph dated 11/04/2015 FINDINGS: The heart size and mediastinal contours are within normal limits. Both lungs are clear. The visualized skeletal structures are unremarkable. IMPRESSION: No active cardiopulmonary disease. Electronically Signed   By: Elgie Collard M.D.   On: 11/09/2017 06:45   Dg Hip Port Unilat With Pelvis 1v Left  Result Date: 11/09/2017 CLINICAL DATA:  Total left hip arthroplasty EXAM: DG HIP (WITH OR WITHOUT PELVIS) 1V PORT LEFT COMPARISON:  None. FINDINGS: Two views of a new left hip arthroplasty show location and no convincing periprosthetic fracture (an incomplete lucency over the greater trochanter is likely soft tissue gas, as seen more superior to the greater trochanter). IMPRESSION: Left hip arthroplasty without complicating feature. Electronically Signed   By: Marnee Spring M.D.   On: 11/09/2017 10:48    DISCHARGE INSTRUCTIONS: Discharge Instructions    Call MD / Call 911   Complete by:  As directed    If you experience chest pain or shortness of breath, CALL 911 and be transported to the hospital emergency room.  If you develope a fever above 101 F, pus (white drainage) or increased drainage or redness at the wound, or calf pain, call your surgeon's office.   Change dressing   Complete by:  As directed    DO NOT CHANGE THE DRESSING   Constipation Prevention   Complete by:  As  directed    Drink plenty of fluids.  Prune juice may be helpful.  You may use a stool softener, such as Colace (over the counter) 100 mg twice a day.  Use MiraLax (over the counter) for constipation as needed.   Diet general   Complete by:  As directed    Discharge instructions   Complete by:  As directed    INSTRUCTIONS AFTER JOINT REPLACEMENT   Remove items at home which could result in a fall. This includes throw rugs  or furniture in walking pathways ICE to the affected joint every three hours while awake for 30 minutes at a time, for at least the first 3-5 days, and then as needed for pain and swelling.  Continue to use ice for pain and swelling. You may notice swelling that will progress down to the foot and ankle.  This is normal after surgery.  Elevate your leg when you are not up walking on it.   Continue to use the breathing machine you got in the hospital (incentive spirometer) which will help keep your temperature down.  It is common for your temperature to cycle up and down following surgery, especially at night when you are not up moving around and exerting yourself.  The breathing machine keeps your lungs expanded and your temperature down.   DIET:  As you were doing prior to hospitalization, we recommend a well-balanced diet.  DRESSING / WOUND CARE / SHOWERING  Keep the surgical dressing until follow up.  The dressing is water proof, so you can shower without any extra covering.  IF THE DRESSING FALLS OFF or the wound gets wet inside, change the dressing with sterile gauze.  Please use good hand washing techniques before changing the dressing.  Do not use any lotions or creams on the incision until instructed by your surgeon.    ACTIVITY  Increase activity slowly as tolerated, but follow the weight bearing instructions below.   No driving for 6 weeks or until further direction given by your physician.  You cannot drive while taking narcotics.  No lifting or carrying greater  than 10 lbs. until further directed by your surgeon. Avoid periods of inactivity such as sitting longer than an hour when not asleep. This helps prevent blood clots.  You may return to work once you are authorized by your doctor.     WEIGHT BEARING   Weight bearing as tolerated with assist device (walker, cane, etc) as directed, use it as long as suggested by your surgeon or therapist, typically at least 4-6 weeks.   EXERCISES  Results after joint replacement surgery are often greatly improved when you follow the exercise, range of motion and muscle strengthening exercises prescribed by your doctor. Safety measures are also important to protect the joint from further injury. Any time any of these exercises cause you to have increased pain or swelling, decrease what you are doing until you are comfortable again and then slowly increase them. If you have problems or questions, call your caregiver or physical therapist for advice.   Rehabilitation is important following a joint replacement. After just a few days of immobilization, the muscles of the leg can become weakened and shrink (atrophy).  These exercises are designed to build up the tone and strength of the thigh and leg muscles and to improve motion. Often times heat used for twenty to thirty minutes before working out will loosen up your tissues and help with improving the range of motion but do not use heat for the first two weeks following surgery (sometimes heat can increase post-operative swelling).   These exercises can be done on a training (exercise) mat, on a table or on a bed. Use whatever works the best and is most comfortable for you.    Use music or television while you are exercising so that the exercises are a pleasant break in your day. This will make your life better with the exercises acting as a break in your routine that you can look forward to.  Perform all exercises about fifteen times, three times per day or as directed.   You should exercise both the operative leg and the other leg as well.   Exercises include:  Quad Sets - Tighten up the muscle on the front of the thigh (Quad) and hold for 5-10 seconds.   Straight Leg Raises - With your knee straight (if you were given a brace, keep it on), lift the leg to 60 degrees, hold for 3 seconds, and slowly lower the leg.  Perform this exercise against resistance later as your leg gets stronger.  Leg Slides: Lying on your back, slowly slide your foot toward your buttocks, bending your knee up off the floor (only go as far as is comfortable). Then slowly slide your foot back down until your leg is flat on the floor again.  Angel Wings: Lying on your back spread your legs to the side as far apart as you can without causing discomfort.  Hamstring Strength:  Lying on your back, push your heel against the floor with your leg straight by tightening up the muscles of your buttocks.  Repeat, but this time bend your knee to a comfortable angle, and push your heel against the floor.  You may put a pillow under the heel to make it more comfortable if necessary.   A rehabilitation program following joint replacement surgery can speed recovery and prevent re-injury in the future due to weakened muscles. Contact your doctor or a physical therapist for more information on knee rehabilitation.    CONSTIPATION  Constipation is defined medically as fewer than three stools per week and severe constipation as less than one stool per week.  Even if you have a regular bowel pattern at home, your normal regimen is likely to be disrupted due to multiple reasons following surgery.  Combination of anesthesia, postoperative narcotics, change in appetite and fluid intake all can affect your bowels.   YOU MUST use at least one of the following options; they are listed in order of increasing strength to get the job done.  They are all available over the counter, and you may need to use some, POSSIBLY  even all of these options:    Drink plenty of fluids (prune juice may be helpful) and high fiber foods Colace 100 mg by mouth twice a day  Senokot for constipation as directed and as needed Dulcolax (bisacodyl), take with full glass of water  Miralax (polyethylene glycol) once or twice a day as needed.  If you have tried all these things and are unable to have a bowel movement in the first 3-4 days after surgery call either your surgeon or your primary doctor.    If you experience loose stools or diarrhea, hold the medications until you stool forms back up.  If your symptoms do not get better within 1 week or if they get worse, check with your doctor.  If you experience "the worst abdominal pain ever" or develop nausea or vomiting, please contact the office immediately for further recommendations for treatment.   ITCHING:  If you experience itching with your medications, try taking only a single pain pill, or even half a pain pill at a time.  You can also use Benadryl over the counter for itching or also to help with sleep.   TED HOSE STOCKINGS:  Use stockings on both legs until for at least 2 weeks or as directed by physician office. They may be removed at night for sleeping.  MEDICATIONS:  See  your medication summary on the "After Visit Summary" that nursing will review with you.  You may have some home medications which will be placed on hold until you complete the course of blood thinner medication.  It is important for you to complete the blood thinner medication as prescribed.  PRECAUTIONS:  If you experience chest pain or shortness of breath - call 911 immediately for transfer to the hospital emergency department.   If you develop a fever greater that 101 F, purulent drainage from wound, increased redness or drainage from wound, foul odor from the wound/dressing, or calf pain - CONTACT YOUR SURGEON.                                                   FOLLOW-UP APPOINTMENTS:  If you do not  already have a post-op appointment, please call the office for an appointment to be seen by your surgeon.  Guidelines for how soon to be seen are listed in your "After Visit Summary", but are typically between 1-4 weeks after surgery.  OTHER INSTRUCTIONS:   Knee Replacement:  Do not place pillow under knee, focus on keeping the knee straight while resting. CPM instructions: 0-90 degrees, 2 hours in the morning, 2 hours in the afternoon, and 2 hours in the evening. Place foam block, curve side up under heel at all times except when in CPM or when walking.  DO NOT modify, tear, cut, or change the foam block in any way.  MAKE SURE YOU:  Understand these instructions.  Get help right away if you are not doing well or get worse.    Thank you for letting us be a part of your medical care team.  It is a privilege we respect greatly.  We hope these instructions will help you stay on track for a fast and full recovery!   Driving restrictions   Complete by:  As directed    No driving for 6 weeks   Follow the hip precautions as taught in Physical Therapy   Complete by:  As directed    Increase activity slowly as tolerated   Complete by:  As directed    Lifting restrictions   Complete by:  As directed    No lifting for 6 weeks   Patient may shower   Complete by:  As directed    You may shower over the brown dressing   TED hose   Complete by:  As directed    Use stockings (TED hose) for 3 weeks on left leg.  You may remove them at night for sleeping.   Weight bearing as tolerated   Complete by:  As directed    Laterality:  left   Extremity:  Lower      DISCHARGE MEDICATIONS:   Allergies as of 11/11/2017      Reactions   Sudafed [pseudoephedrine Hcl] Other (See Comments)   hypertactive      Medication List    STOP taking these medications   ibuprofen 200 MG tablet Commonly known as:  ADVIL,MOTRIN   mupirocin ointment 2 % Commonly known as:  BACTROBAN   traMADol 50 MG  tablet Commonly known as:  ULTRAM     TAKE these medications   acetaminophen 325 MG tablet Commonly known as:  TYLENOL Take 1-2 tablets (325-650 mg total) by mouth every 6 (six)  hours as needed for mild pain (pain score 1-3 or temp > 100.5).   amLODipine 10 MG tablet Commonly known as:  NORVASC Take 10 mg by mouth daily.   aspirin 81 MG chewable tablet Chew 1 tablet (81 mg total) by mouth 2 (two) times daily.   atorvastatin 10 MG tablet Commonly known as:  LIPITOR Take 10 mg by mouth daily.   CALCIUM PO Take by mouth.   Co Q-10 200 MG Caps Take 200 mg by mouth daily.   CRANBERRY PO Take 2 tablets by mouth 2 (two) times daily.   DULoxetine 30 MG capsule Commonly known as:  CYMBALTA TAKE 1 CAPSULE BY MOUTH AT  BEDTIME   fexofenadine 180 MG tablet Commonly known as:  ALLEGRA Take 180 mg by mouth daily with breakfast.   gabapentin 300 MG capsule Commonly known as:  NEURONTIN TAKE 1 CAPSULE BY MOUTH TWO TIMES DAILY What changed:  See the new instructions.   KERYDIN 5 % Soln Generic drug:  Tavaborole Apply 1 application topically daily. Apply to toes   Magnesium 500 MG Tabs Take 1,000 mg by mouth 2 (two) times daily.   Manganese 50 MG Tabs Take 50 mg by mouth daily.   METAMUCIL FIBER PO Take 5 tablets by mouth 2 (two) times daily.   methocarbamol 500 MG tablet Commonly known as:  ROBAXIN Take 1 tablet (500 mg total) by mouth every 8 (eight) hours as needed for muscle spasms.   oxyCODONE 5 MG immediate release tablet Commonly known as:  Oxy IR/ROXICODONE Take 1-2 tablets (5-10 mg total) by mouth every 4 (four) hours as needed for moderate pain or severe pain.   PROBIOTIC DAILY Caps Take 1 tablet by mouth daily.   senna 8.6 MG tablet Commonly known as:  SENOKOT Take 4 tablets by mouth daily.   thiamine 100 MG tablet Commonly known as:  VITAMIN B-1 Take 100 mg by mouth daily with breakfast.   VITAMIN D-400 400 units Tabs tablet Generic drug:   cholecalciferol Take 400 Units by mouth daily with breakfast.            Durable Medical Equipment  (From admission, onward)         Start     Ordered   11/09/17 1202  DME Walker rolling  Once    Question:  Patient needs a walker to treat with the following condition  Answer:  S/P total hip arthroplasty   11/09/17 1201   11/09/17 1202  DME 3 n 1  Once     11/09/17 1201   11/09/17 1202  DME Bedside commode  Once    Question:  Patient needs a bedside commode to treat with the following condition  Answer:  S/P total hip arthroplasty   11/09/17 1201           Discharge Care Instructions  (From admission, onward)         Start     Ordered   11/11/17 0000  Weight bearing as tolerated    Question Answer Comment  Laterality left   Extremity Lower      11/11/17 0920   11/11/17 0000  Change dressing    Comments:  DO NOT CHANGE THE DRESSING   11/11/17 0920          FOLLOW UP VISIT:    DISPOSITION:   Home  CONDITION:  Stable   Arlys JohnBrian D. Aleda Granaetrarca, PA-C [facility] [phone]  11/11/2017 9:21 AM

## 2017-11-11 NOTE — Plan of Care (Signed)
  Problem: Education: Goal: Knowledge of General Education information will improve Description: Including pain rating scale, medication(s)/side effects and non-pharmacologic comfort measures Outcome: Adequate for Discharge   Problem: Health Behavior/Discharge Planning: Goal: Ability to manage health-related needs will improve Outcome: Adequate for Discharge   Problem: Clinical Measurements: Goal: Ability to maintain clinical measurements within normal limits will improve Outcome: Adequate for Discharge Goal: Will remain free from infection Outcome: Adequate for Discharge Goal: Diagnostic test results will improve Outcome: Adequate for Discharge Goal: Respiratory complications will improve Outcome: Adequate for Discharge Goal: Cardiovascular complication will be avoided Outcome: Adequate for Discharge   Problem: Activity: Goal: Risk for activity intolerance will decrease Outcome: Adequate for Discharge   Problem: Nutrition: Goal: Adequate nutrition will be maintained Outcome: Adequate for Discharge   Problem: Coping: Goal: Level of anxiety will decrease Outcome: Adequate for Discharge   Problem: Elimination: Goal: Will not experience complications related to bowel motility Outcome: Adequate for Discharge Goal: Will not experience complications related to urinary retention Outcome: Adequate for Discharge   Problem: Pain Managment: Goal: General experience of comfort will improve Outcome: Adequate for Discharge   Problem: Safety: Goal: Ability to remain free from injury will improve Outcome: Adequate for Discharge   Problem: Skin Integrity: Goal: Risk for impaired skin integrity will decrease Outcome: Adequate for Discharge   Problem: Education: Goal: Knowledge of the prescribed therapeutic regimen will improve Outcome: Adequate for Discharge Goal: Understanding of discharge needs will improve Outcome: Adequate for Discharge Goal: Individualized Educational  Video(s) Outcome: Adequate for Discharge   Problem: Activity: Goal: Ability to avoid complications of mobility impairment will improve Outcome: Adequate for Discharge Goal: Ability to tolerate increased activity will improve Outcome: Adequate for Discharge   Problem: Clinical Measurements: Goal: Postoperative complications will be avoided or minimized Outcome: Adequate for Discharge   Problem: Pain Management: Goal: Pain level will decrease with appropriate interventions Outcome: Adequate for Discharge   

## 2017-11-11 NOTE — Progress Notes (Signed)
Patient ambulated around entire unit during this shift with her spouse and a walker.  Patient tolerated walk well.

## 2017-11-11 NOTE — Progress Notes (Signed)
Subjective: 2 Days Post-Op Procedure(s) (LRB): LEFT TOTAL HIP ARTHROPLASTY (Left) Patient reports pain as mild and moderate.    Objective: Vital signs in last 24 hours: Temp:  [98.2 F (36.8 C)-99 F (37.2 C)] 98.4 F (36.9 C) (08/29 0500) Pulse Rate:  [69-98] 90 (08/29 0500) Resp:  [17-18] 18 (08/29 0500) BP: (114-123)/(60-68) 123/65 (08/29 0500) SpO2:  [92 %-99 %] 99 % (08/29 0500) Weight:  [99.3 kg] 99.3 kg (08/29 0254)  Intake/Output from previous day: 08/28 0701 - 08/29 0700 In: 960 [P.O.:960] Out: -  Intake/Output this shift: No intake/output data recorded.  Recent Labs    11/09/17 0631 11/10/17 0410 11/11/17 0523  HGB 12.9 10.3* 9.6*   Recent Labs    11/10/17 0410 11/11/17 0523  WBC 6.6 9.3  RBC 3.32* 3.10*  HCT 31.8* 29.6*  PLT 228 214   Recent Labs    11/10/17 0410 11/11/17 0523  NA 141 141  K 3.7 3.6  CL 106 106  CO2 27 28  BUN 10 10  CREATININE 0.79 0.73  GLUCOSE 129* 122*  CALCIUM 8.2* 8.3*   Recent Labs    11/09/17 0631  INR 0.99    Neurovascular intact Sensation intact distally Intact pulses distally Dorsiflexion/Plantar flexion intact Incision: scant drainage    Assessment/Plan: 2 Days Post-Op Procedure(s) (LRB): LEFT TOTAL HIP ARTHROPLASTY (Left) Up with therapy Discharge home with home health    Jacqualine CodeBrian Petrarca 11/11/2017, 9:05 AM

## 2017-11-11 NOTE — Care Management Note (Signed)
Case Management Note  Patient Details  Name: Miranda Romero MRN: [id number] Date of Birth: 1946/03/01  Subjective/Objective:   72 yr old female s/p left total hip arthroplasty.                 Action/Plan:  Patient was preoperatively setup with [facility], no changes. She has DME and will have support at discharge.   Expected Discharge Date:  11/11/17               Expected Discharge Plan:  Home w Home Health Services  In-House Referral:  NA  Discharge planning Services  CM Consult  Post Acute Care Choice:  Durable Medical Equipment, Home Health Choice offered to:  Patient  DME Arranged:  3-N-1, Walker rolling DME Agency:  [employer]  HH Arranged:  PT HH Agency[facility]  [facility]  Status of Service:  Completed, signed off  If discussed at [employer] Meetings, dates discussed:    Additional Comments:  Durenda GuthrieBrady, Nichola Cieslinski Naomi, RN 11/11/2017, 10:49 AM

## 2017-11-12 ENCOUNTER — Telehealth (INDEPENDENT_AMBULATORY_CARE_PROVIDER_SITE_OTHER): Payer: Self-pay

## 2017-11-12 NOTE — Telephone Encounter (Signed)
[facility] for [facility]  [phone]

## 2017-11-13 ENCOUNTER — Other Ambulatory Visit: Payer: Self-pay | Admitting: Physician Assistant

## 2017-11-13 ENCOUNTER — Other Ambulatory Visit: Payer: Self-pay | Admitting: Rheumatology

## 2017-11-16 NOTE — Telephone Encounter (Signed)
Last Visit: 07/07/17 Next Visit: 04/25/18  Okay to refill per Dr. Corliss Skains

## 2017-11-16 NOTE — Telephone Encounter (Signed)
Spoke with Loraine Leriche for 1wk/1 and 2wk/2 orders for PT

## 2017-11-16 NOTE — Telephone Encounter (Signed)
Last Visit: 07/07/17 Next Visit: 04/25/18 UDS: 03/30/17 Narcotic Agreement: 04/05/17  Patient advised she is due to update UDS. Patient states she will be in the office to see Dr. Cleophas Dunker on 11/22/17 and will update at that time.   Okay to refill Tramadol?

## 2017-11-22 ENCOUNTER — Encounter (INDEPENDENT_AMBULATORY_CARE_PROVIDER_SITE_OTHER): Payer: Self-pay | Admitting: Orthopaedic Surgery

## 2017-11-22 ENCOUNTER — Ambulatory Visit (INDEPENDENT_AMBULATORY_CARE_PROVIDER_SITE_OTHER): Payer: Medicare Other | Admitting: Orthopaedic Surgery

## 2017-11-22 VITALS — BP 114/73 | HR 91 | Ht 65.0 in | Wt 219.0 lb

## 2017-11-22 DIAGNOSIS — Z96642 Presence of left artificial hip joint: Secondary | ICD-10-CM

## 2017-11-22 NOTE — Progress Notes (Signed)
Office Visit Note   Patient: Miranda Romero           Date of Birth: 1945/10/05           MRN: [id number] Visit Date: 11/22/2017              Requested by: Gaspar Garbe, MD [address] [place], [place] [address] PCP: Wylene Simmer Adelfa Koh, MD   Assessment & Plan: Visit Diagnoses:  1. Status post left hip replacement     Plan: 2 weeks status post primary left total hip replacement doing very well.  Only taking 2 oxycodone a day.  Independent with a walker.  Home health therapy has released her.  Mrs. Sylvia would like to do her exercises on her own and wean herself from the walker.  It with a 2 aspirin a day and return to see me in 2 weeks  Follow-Up Instructions: Return in about 2 weeks (around 12/06/2017).   Orders:  No orders of the defined types were placed in this encounter.  No orders of the defined types were placed in this encounter.     Procedures: No procedures performed   Clinical Data: No additional findings.   Subjective: Chief Complaint  Patient presents with  . Post-op Follow-up    11/09/17 L HIP SURGERY DOING GREAT JUST A LITTLE PAIN. PTTOLD PT SHE IS DOING GREAT.  No related fever or chills or shortness of breath or chest pain.  No calf pain.  Wearing support stockings.  Taking 2 aspirin a day.  Minimal discomfort compared to her preop status  HPI  Review of Systems  Constitutional: Negative for fatigue and fever.  HENT: Negative for ear pain.   Eyes: Negative for pain.  Respiratory: Negative for cough and shortness of breath.   Cardiovascular: Positive for leg swelling.  Gastrointestinal: Negative for constipation and diarrhea.  Genitourinary: Negative for difficulty urinating.  Musculoskeletal: Negative for back pain and neck pain.  Skin: Negative for rash.  Allergic/Immunologic: Negative for food allergies.  Neurological: Positive for weakness. Negative for numbness.  Hematological: Bruises/bleeds easily.  Psychiatric/Behavioral:  Negative for sleep disturbance.     Objective: Vital Signs: BP 114/73 (BP Location: Left Arm, Patient Position: Sitting, Cuff Size: Normal)   Pulse 91   Ht 5\' 5"  (1.651 m)   Wt 219 lb (99.3 kg)   LMP 09/20/2000 (Exact Date)   BMI 36.44 kg/m   Physical Exam  Ortho Exam awake alert and oriented x3.  Comfortable sitting dressing removed from left hip.  Wound clean and dry.  Clips removed and Steri-Strips applied.  No drainage.  No calf pain.  Neurovascular exam intact.  Had postop films in recovery room several weeks ago with excellent position.  Specialty Comments:  No specialty comments available.  Imaging: No results found.   PMFS History: Patient Active Problem List   Diagnosis Date Noted  . Osteoarthritis of left hip 11/09/2017  . S/P hip replacement, left 11/09/2017  . Primary osteoarthritis of both hips 09/25/2016  . Fibromyalgia 04/28/2016  . Primary osteoarthritis of both knees 04/28/2016  . History of total knee replacement, bilateral 04/28/2016  . Primary osteoarthritis of both hands 04/28/2016  . Primary insomnia 04/28/2016  . History of sleep apnea 04/28/2016  . History of restless legs syndrome 04/28/2016  . Osteopenia of multiple sites 04/28/2016  . Hiatal hernia with gastroesophageal reflux 11/06/2015  . Incisional hernia 10/16/2010  . HYPERTENSION 05/14/2010  . OBSTRUCTIVE SLEEP APNEA 05/14/2010   Past Medical History:  Diagnosis Date  . Arthritis   . Blood transfusion   . Fibromyalgia    Dr Corliss Skains  . GERD (gastroesophageal reflux disease)   . Hiatal hernia with gastroesophageal reflux 11/06/2015  . History of hiatal hernia   . Hyperlipidemia   . Hypertension    Dr Wylene Simmer LOV 4/13 on chart  . Seasonal allergies   . Sleep apnea    SEVERE  per sleep study 3/12 EPIC no CPAP   . Transfusion history    3 yrs ago s/p surgery for knee  . Umbilical hernia   . Weight decrease     Family History  Problem Relation Age of Onset  . Lymphoma Mother      Past Surgical History:  Procedure Laterality Date  . ANKLE SURGERY    . APPENDECTOMY  2014  . BALLOON DILATION N/A 06/05/2015   Procedure: BALLOON DILATION;  Surgeon: Willis Modena, MD;  Location: [facility];  Service: Endoscopy;  Laterality: N/A;  . CATARACT EXTRACTION Bilateral 05/2013  . CHOLECYSTECTOMY    . DILATION AND CURETTAGE OF UTERUS    . ESOPHAGOGASTRODUODENOSCOPY (EGD) WITH PROPOFOL N/A 06/05/2015   Procedure: ESOPHAGOGASTRODUODENOSCOPY (EGD) WITH PROPOFOL;  Surgeon: Willis Modena, MD;  Location: [facility];  Service: Endoscopy;  Laterality: N/A;  . HERNIA REPAIR  05/2013   due to appendectomy with mesh  . HIATAL HERNIA REPAIR N/A 11/06/2015   Procedure: LAPAROSCOPIC REPAIR HIATAL HERNIA, Nissen FUNDOPLICATION;  Surgeon: Claud Kelp, MD;  Location: [facility];  Service: General;  Laterality: N/A;  . JOINT REPLACEMENT    . KNEE ARTHROPLASTY Bilateral   . knee infection Right    knee hardware removed 11/12/   replaced 1/13  . TOTAL HIP ARTHROPLASTY Left 11/09/2017   Procedure: LEFT TOTAL HIP ARTHROPLASTY;  Surgeon: Valeria Batman, MD;  Location: [facility];  Service: Orthopedics;  Laterality: Left;  Marland Kitchen VENTRAL HERNIA REPAIR  07/21/2011   Procedure: LAPAROSCOPIC VENTRAL HERNIA;  Surgeon: Ernestene Mention, MD;  Location: [facility];  Service: General;  Laterality: N/A;  Laparoscopic Repair Verntal Incisional Hernia and Mesh   Social History   Occupational History  . Occupation: Doctor, general practice Retired  Tobacco Use  . Smoking status: Never Smoker  . Smokeless tobacco: Never Used  Substance and Sexual Activity  . Alcohol use: Yes    Comment: socially  . Drug use: No  . Sexual activity: Yes    Partners: Male    Birth control/protection: Post-menopausal

## 2017-12-10 ENCOUNTER — Encounter (INDEPENDENT_AMBULATORY_CARE_PROVIDER_SITE_OTHER): Payer: Self-pay | Admitting: Orthopaedic Surgery

## 2017-12-10 ENCOUNTER — Ambulatory Visit (INDEPENDENT_AMBULATORY_CARE_PROVIDER_SITE_OTHER): Payer: Medicare Other | Admitting: Orthopaedic Surgery

## 2017-12-10 VITALS — BP 112/73 | HR 95 | Ht 65.0 in

## 2017-12-10 DIAGNOSIS — Z96642 Presence of left artificial hip joint: Secondary | ICD-10-CM

## 2017-12-10 NOTE — Progress Notes (Signed)
Office Visit Note   Patient: Miranda Romero           Date of Birth: [birth year]           MRN: [id number] Visit Date: 12/10/2017              Requested by: Gaspar Garbe, MD [address] [place], [place] [address] PCP: Gaspar Garbe, MD   Assessment & Plan: Visit Diagnoses:  1. Status post left hip replacement     Plan: 1 month status post primary left total hip replacement and doing well.  No pain meds in 2 weeks.  Urged to continue exercises.  Office 3 months.  Okay to discontinue support stockings and aspirin  Follow-Up Instructions: Return in about 3 months (around 03/11/2018).   Orders:  No orders of the defined types were placed in this encounter.  No orders of the defined types were placed in this encounter.     Procedures: No procedures performed   Clinical Data: No additional findings.   Subjective: Chief Complaint  Patient presents with  . Follow-up    11/09/17 L HIP SURGERY THINGS GOING WELL, NO NUMBNESS OR TINGLING    HPI  Review of Systems  Constitutional: Negative for fatigue and fever.  HENT: Negative for ear pain.   Eyes: Negative for pain.  Respiratory: Negative for cough and shortness of breath.   Cardiovascular: Negative for leg swelling.  Gastrointestinal: Negative for constipation and diarrhea.  Genitourinary: Negative for difficulty urinating.  Musculoskeletal: Negative for back pain and neck pain.  Skin: Negative for rash.  Allergic/Immunologic: Negative for food allergies.  Neurological: Negative for weakness and numbness.  Hematological: Bruises/bleeds easily.  Psychiatric/Behavioral: Negative for sleep disturbance.     Objective: Vital Signs: BP 112/73 (BP Location: Right Arm, Patient Position: Sitting, Cuff Size: Normal)   Pulse 95   Ht 5\' 5"  (1.651 m)   LMP 09/20/2000 (Exact Date)   BMI 36.44 kg/m   Physical Exam  Ortho Exam awake alert and oriented x3.  Comfortable sitting.  Minimal limp in reference  to left lower extremity.  Leg lengths appear to be symmetrical.  Neurovascular exam intact.  No issues with the left hip incision.  Distal edema.  Specialty Comments:  No specialty comments available.  Imaging: No results found.   PMFS History: Patient Active Problem List   Diagnosis Date Noted  . Osteoarthritis of left hip 11/09/2017  . S/P hip replacement, left 11/09/2017  . Primary osteoarthritis of both hips 09/25/2016  . Fibromyalgia 04/28/2016  . Primary osteoarthritis of both knees 04/28/2016  . History of total knee replacement, bilateral 04/28/2016  . Primary osteoarthritis of both hands 04/28/2016  . Primary insomnia 04/28/2016  . History of sleep apnea 04/28/2016  . History of restless legs syndrome 04/28/2016  . Osteopenia of multiple sites 04/28/2016  . Hiatal hernia with gastroesophageal reflux 11/06/2015  . Incisional hernia 10/16/2010  . HYPERTENSION 05/14/2010  . OBSTRUCTIVE SLEEP APNEA 05/14/2010   Past Medical History:  Diagnosis Date  . Arthritis   . Blood transfusion   . Fibromyalgia    Dr Corliss Skains  . GERD (gastroesophageal reflux disease)   . Hiatal hernia with gastroesophageal reflux 11/06/2015  . History of hiatal hernia   . Hyperlipidemia   . Hypertension    Dr Wylene Simmer LOV 4/13 on chart  . Seasonal allergies   . Sleep apnea    SEVERE  per sleep study 3/12 EPIC no CPAP   . Transfusion history  3 yrs ago s/p surgery for knee  . Umbilical hernia   . Weight decrease     Family History  Problem Relation Age of Onset  . Lymphoma Mother     Past Surgical History:  Procedure Laterality Date  . ANKLE SURGERY    . APPENDECTOMY  2014  . BALLOON DILATION N/A 06/05/2015   Procedure: BALLOON DILATION;  Surgeon: Willis Modena, MD;  Location: [facility];  Service: Endoscopy;  Laterality: N/A;  . CATARACT EXTRACTION Bilateral 05/2013  . CHOLECYSTECTOMY    . DILATION AND CURETTAGE OF UTERUS    . ESOPHAGOGASTRODUODENOSCOPY (EGD) WITH PROPOFOL N/A  06/05/2015   Procedure: ESOPHAGOGASTRODUODENOSCOPY (EGD) WITH PROPOFOL;  Surgeon: Willis Modena, MD;  Location: [facility];  Service: Endoscopy;  Laterality: N/A;  . HERNIA REPAIR  05/2013   due to appendectomy with mesh  . HIATAL HERNIA REPAIR N/A 11/06/2015   Procedure: LAPAROSCOPIC REPAIR HIATAL HERNIA, Nissen FUNDOPLICATION;  Surgeon: Claud Kelp, MD;  Location: [facility];  Service: General;  Laterality: N/A;  . JOINT REPLACEMENT    . KNEE ARTHROPLASTY Bilateral   . knee infection Right    knee hardware removed 11/12/   replaced 1/13  . TOTAL HIP ARTHROPLASTY Left 11/09/2017   Procedure: LEFT TOTAL HIP ARTHROPLASTY;  Surgeon: Valeria Batman, MD;  Location: [facility];  Service: Orthopedics;  Laterality: Left;  Marland Kitchen VENTRAL HERNIA REPAIR  07/21/2011   Procedure: LAPAROSCOPIC VENTRAL HERNIA;  Surgeon: Ernestene Mention, MD;  Location: [facility];  Service: General;  Laterality: N/A;  Laparoscopic Repair Verntal Incisional Hernia and Mesh   Social History   Occupational History  . Occupation: Doctor, general practice Retired  Tobacco Use  . Smoking status: Never Smoker  . Smokeless tobacco: Never Used  Substance and Sexual Activity  . Alcohol use: Yes    Comment: socially  . Drug use: No  . Sexual activity: Yes    Partners: Male    Birth control/protection: Post-menopausal

## 2017-12-13 NOTE — Addendum Note (Signed)
Addended by: Thornell Mule R on: 12/13/2017 08:34 AM   Modules accepted: Orders

## 2018-01-05 ENCOUNTER — Other Ambulatory Visit: Payer: Self-pay | Admitting: Rheumatology

## 2018-01-05 NOTE — Telephone Encounter (Signed)
Last Visit: 07/07/17 Next Visit: 04/25/18  Okay to refill per Dr. Deveshwar 

## 2018-03-14 ENCOUNTER — Ambulatory Visit (INDEPENDENT_AMBULATORY_CARE_PROVIDER_SITE_OTHER): Payer: Medicare Other | Admitting: Orthopaedic Surgery

## 2018-03-14 ENCOUNTER — Encounter (INDEPENDENT_AMBULATORY_CARE_PROVIDER_SITE_OTHER): Payer: Self-pay | Admitting: Orthopaedic Surgery

## 2018-03-14 VITALS — BP 121/74 | HR 90 | Ht 65.0 in | Wt 229.0 lb

## 2018-03-14 DIAGNOSIS — Z96642 Presence of left artificial hip joint: Secondary | ICD-10-CM | POA: Diagnosis not present

## 2018-03-14 NOTE — Progress Notes (Signed)
left total hip replacement  11/09/17 and still having pain first get up. Taking Rx Tramadol bid or as needed.

## 2018-03-14 NOTE — Progress Notes (Signed)
Office Visit Note   Patient: Miranda Romero           Date of Birth: 03-19-1945           MRN: [id number] Visit Date: 03/14/2018              Requested by: Gaspar Garbeisovec, Richard W, MD [address] [place], [place] [id number] PCP: Wylene Simmerisovec, Adelfa Kohichard W, MD   Assessment & Plan: Visit Diagnoses:  1. Status post left hip replacement     Plan: 4 months status post primary left total hip replacement doing well except for a very active fibromyalgia.  Taking tramadol.  Relates that her left hip is "much better".  We will continue with exercises and return in 3 months  Follow-Up Instructions: Return in about 3 months (around 06/13/2018).   Orders:  No orders of the defined types were placed in this encounter.  No orders of the defined types were placed in this encounter.     Procedures: No procedures performed   Clinical Data: No additional findings.   Subjective: Chief Complaint  Patient presents with  . Left Hip - Routine Post Op, Pain  4 months status post primary left total hip replacement and doing well.  Much better preoperative status.  Fibromyalgia is very active.  Seeing Dr. Corliss Skainseveshwar and taking tramadol.  No related fever or chills  HPI  Review of Systems   Objective: Vital Signs: BP 121/74   Pulse 90   Ht 5\' 5"  (1.651 m)   Wt 229 lb (103.9 kg)   LMP 09/20/2000 (Exact Date)   BMI 38.11 kg/m   Physical Exam Constitutional:      Appearance: She is well-developed.  Eyes:     Pupils: Pupils are equal, round, and reactive to light.  Pulmonary:     Effort: Pulmonary effort is normal.  Skin:    General: Skin is warm and dry.  Neurological:     Mental Status: She is alert and oriented to person, place, and time.  Psychiatric:        Behavior: Behavior normal.     Ortho Exam alert and oriented x3.  Comfortable sitting.  Leg lengths appear to be symmetrical.  Neurovascular exam intact.  No pain with range of motion of left hip  Specialty Comments:  No  specialty comments available.  Imaging: No results found.   PMFS History: Patient Active Problem List   Diagnosis Date Noted  . Osteoarthritis of left hip 11/09/2017  . S/P hip replacement, left 11/09/2017  . Primary osteoarthritis of both hips 09/25/2016  . Fibromyalgia 04/28/2016  . Primary osteoarthritis of both knees 04/28/2016  . History of total knee replacement, bilateral 04/28/2016  . Primary osteoarthritis of both hands 04/28/2016  . Primary insomnia 04/28/2016  . History of sleep apnea 04/28/2016  . History of restless legs syndrome 04/28/2016  . Osteopenia of multiple sites 04/28/2016  . Hiatal hernia with gastroesophageal reflux 11/06/2015  . Incisional hernia 10/16/2010  . HYPERTENSION 05/14/2010  . OBSTRUCTIVE SLEEP APNEA 05/14/2010   Past Medical History:  Diagnosis Date  . Arthritis   . Blood transfusion   . Fibromyalgia    Dr Corliss Skainseveshwar  . GERD (gastroesophageal reflux disease)   . Hiatal hernia with gastroesophageal reflux 11/06/2015  . History of hiatal hernia   . Hyperlipidemia   . Hypertension    Dr Wylene Simmerisovec LOV 4/13 on chart  . Seasonal allergies   . Sleep apnea    SEVERE  per sleep study 3/12  EPIC no CPAP   . Transfusion history    3 yrs ago s/p surgery for knee  . Umbilical hernia   . Weight decrease     Family History  Problem Relation Age of Onset  . Lymphoma Mother     Past Surgical History:  Procedure Laterality Date  . ANKLE SURGERY    . APPENDECTOMY  2014  . BALLOON DILATION N/A 06/05/2015   Procedure: BALLOON DILATION;  Surgeon: Willis ModenaWilliam Outlaw, MD;  Location: [facility];  Service: Endoscopy;  Laterality: N/A;  . CATARACT EXTRACTION Bilateral 05/2013  . CHOLECYSTECTOMY    . DILATION AND CURETTAGE OF UTERUS    . ESOPHAGOGASTRODUODENOSCOPY (EGD) WITH PROPOFOL N/A 06/05/2015   Procedure: ESOPHAGOGASTRODUODENOSCOPY (EGD) WITH PROPOFOL;  Surgeon: Willis ModenaWilliam Outlaw, MD;  Location: [facility];  Service: Endoscopy;  Laterality: N/A;  .  HERNIA REPAIR  05/2013   due to appendectomy with mesh  . HIATAL HERNIA REPAIR N/A 11/06/2015   Procedure: LAPAROSCOPIC REPAIR HIATAL HERNIA, Nissen FUNDOPLICATION;  Surgeon: Claud KelpHaywood Ingram, MD;  Location: [facility];  Service: General;  Laterality: N/A;  . JOINT REPLACEMENT    . KNEE ARTHROPLASTY Bilateral   . knee infection Right    knee hardware removed 11/12/   replaced 1/13  . TOTAL HIP ARTHROPLASTY Left 11/09/2017   Procedure: LEFT TOTAL HIP ARTHROPLASTY;  Surgeon: Valeria BatmanWhitfield, Solita Macadam W, MD;  Location: [facility];  Service: Orthopedics;  Laterality: Left;  Marland Kitchen. VENTRAL HERNIA REPAIR  07/21/2011   Procedure: LAPAROSCOPIC VENTRAL HERNIA;  Surgeon: Ernestene MentionHaywood M Ingram, MD;  Location: [facility];  Service: General;  Laterality: N/A;  Laparoscopic Repair Verntal Incisional Hernia and Mesh   Social History   Occupational History  . Occupation: Doctor, general practicepeech Pathologist Retired  Tobacco Use  . Smoking status: Never Smoker  . Smokeless tobacco: Never Used  Substance and Sexual Activity  . Alcohol use: Yes    Comment: socially  . Drug use: No  . Sexual activity: Yes    Partners: Male    Birth control/protection: Post-menopausal     Valeria BatmanPeter W Lovette Merta, MD   Note - This record has been created using [employer] software.  Chart creation errors have been sought, but may not always  have been located. Such creation errors do not reflect on  the standard of medical care.

## 2018-03-15 ENCOUNTER — Other Ambulatory Visit: Payer: Self-pay | Admitting: Rheumatology

## 2018-03-15 NOTE — Telephone Encounter (Signed)
Last Visit: 07/07/17 Next Visit: 04/25/18  Okay to refill per Dr. Corliss Skainseveshwar

## 2018-04-11 NOTE — Progress Notes (Signed)
Office Visit Note  Patient: Miranda Romero             Date of Birth: [birth year]           MRN: [id number]             PCP: Gaspar Garbe, MD Referring: Gaspar Garbe, MD Visit Date: 04/25/2018 Occupation: @GUAROCC @  Subjective:  Arthritis (Bil arms, bil legs, bil knees, severe pain) and Fibromyalgia   History of Present Illness: Miranda Romero is a 73 y.o. female with history of fibromyalgia and osteoarthritis.  Patient states she underwent left total hip replacement in August 2019 which is doing well.  Her bilateral knee replacements are doing well.  She went to [place] and then to [place] and [place].  She states when she came back she started having generalized pain and discomfort.  She also feels very tired.  She describes pain in her upper and lower extremities and constant pain.  She states she does not like taking tramadol and takes only 1 tablet a day when she is in extreme pain.  She states tramadol causes constipation.  She is also reduced Cymbalta and gabapentin.  Activities of Daily Living:  Patient reports morning stiffness for 1 hours.   Patient Denies nocturnal pain.  Difficulty dressing/grooming: Denies Difficulty climbing stairs: REPORTS Difficulty getting out of chair: Reports Difficulty using hands for taps, buttons, cutlery, and/or writing: Denies  Review of Systems  Constitutional: Positive for fatigue. Negative for night sweats, weight gain and weight loss.  HENT: Negative for mouth sores, trouble swallowing, trouble swallowing, mouth dryness and nose dryness.   Eyes: Positive for dryness. Negative for pain, redness and visual disturbance.  Respiratory: Negative for cough, shortness of breath and difficulty breathing.   Cardiovascular: Positive for swelling in legs/feet. Negative for chest pain, palpitations, hypertension and irregular heartbeat.  Gastrointestinal: Positive for constipation. Negative for blood in stool and diarrhea.    Endocrine: Negative for increased urination.  Genitourinary: Negative for difficulty urinating and vaginal dryness.  Musculoskeletal: Positive for arthralgias, gait problem, joint pain, muscle weakness, morning stiffness and muscle tenderness. Negative for joint swelling, myalgias and myalgias.  Skin: Negative for color change, rash, hair loss, skin tightness, ulcers and sensitivity to sunlight.  Allergic/Immunologic: Negative for susceptible to infections.  Neurological: Positive for weakness. Negative for dizziness, memory loss and night sweats.  Hematological: Negative for bruising/bleeding tendency and swollen glands.  Psychiatric/Behavioral: Positive for sleep disturbance. Negative for depressed mood. The patient is not nervous/anxious.     PMFS History:  Patient Active Problem List   Diagnosis Date Noted  . Osteoarthritis of left hip 11/09/2017  . S/P hip replacement, left 11/09/2017  . Primary osteoarthritis of both hips 09/25/2016  . Fibromyalgia 04/28/2016  . Primary osteoarthritis of both knees 04/28/2016  . History of total knee replacement, bilateral 04/28/2016  . Primary osteoarthritis of both hands 04/28/2016  . Primary insomnia 04/28/2016  . History of sleep apnea 04/28/2016  . History of restless legs syndrome 04/28/2016  . Osteopenia of multiple sites 04/28/2016  . Hiatal hernia with gastroesophageal reflux 11/06/2015  . Incisional hernia 10/16/2010  . HYPERTENSION 05/14/2010  . OBSTRUCTIVE SLEEP APNEA 05/14/2010    Past Medical History:  Diagnosis Date  . Arthritis   . Blood transfusion   . Fibromyalgia    Dr Corliss Skains  . GERD (gastroesophageal reflux disease)   . Hiatal hernia with gastroesophageal reflux 11/06/2015  . History of hiatal hernia   . Hyperlipidemia   .  Hypertension    Dr Wylene Simmerisovec LOV 4/13 on chart  . Seasonal allergies   . Sleep apnea    SEVERE  per sleep study 3/12 EPIC no CPAP   . Transfusion history    3 yrs ago s/p surgery for knee  .  Umbilical hernia   . Weight decrease     Family History  Problem Relation Age of Onset  . Lymphoma Mother    Past Surgical History:  Procedure Laterality Date  . ANKLE SURGERY    . APPENDECTOMY  2014  . BALLOON DILATION N/A 06/05/2015   Procedure: BALLOON DILATION;  Surgeon: Willis ModenaWilliam Outlaw, MD;  Location: [facility];  Service: Endoscopy;  Laterality: N/A;  . CATARACT EXTRACTION Bilateral 05/2013  . CHOLECYSTECTOMY    . DILATION AND CURETTAGE OF UTERUS    . ESOPHAGOGASTRODUODENOSCOPY (EGD) WITH PROPOFOL N/A 06/05/2015   Procedure: ESOPHAGOGASTRODUODENOSCOPY (EGD) WITH PROPOFOL;  Surgeon: Willis ModenaWilliam Outlaw, MD;  Location: [facility];  Service: Endoscopy;  Laterality: N/A;  . HERNIA REPAIR  05/2013   due to appendectomy with mesh  . HIATAL HERNIA REPAIR N/A 11/06/2015   Procedure: LAPAROSCOPIC REPAIR HIATAL HERNIA, Nissen FUNDOPLICATION;  Surgeon: Claud KelpHaywood Ingram, MD;  Location: [facility];  Service: General;  Laterality: N/A;  . JOINT REPLACEMENT    . KNEE ARTHROPLASTY Bilateral   . knee infection Right    knee hardware removed 11/12/   replaced 1/13  . TOTAL HIP ARTHROPLASTY Left 11/09/2017   Procedure: LEFT TOTAL HIP ARTHROPLASTY;  Surgeon: Valeria BatmanWhitfield, Peter W, MD;  Location: [facility];  Service: Orthopedics;  Laterality: Left;  Marland Kitchen. VENTRAL HERNIA REPAIR  07/21/2011   Procedure: LAPAROSCOPIC VENTRAL HERNIA;  Surgeon: Ernestene MentionHaywood M Ingram, MD;  Location: [facility];  Service: General;  Laterality: N/A;  Laparoscopic Repair Verntal Incisional Hernia and Mesh   Social History   Social History Narrative   Lives at home with her husband   They have second home in [place] where they spend a lot of time   Right handed   Caffeine: 2 cups daily   Immunization History  Administered Date(s) Administered  . Influenza Whole 11/18/2009  . Influenza-Unspecified 11/29/2010, 01/18/2015, 11/30/2015  . Pneumococcal Polysaccharide-23 11/29/2007, 04/16/2008     Objective: Vital Signs: BP 139/88 (BP Location: Left Arm,  Patient Position: Sitting, Cuff Size: Normal)   Pulse (!) 104   Resp 18   Ht 5' 5.5" (1.664 m)   Wt 225 lb 6.4 oz (102.2 kg)   LMP 09/20/2000 (Exact Date)   BMI 36.94 kg/m    Physical Exam Vitals signs and nursing note reviewed.  Constitutional:      Appearance: She is well-developed.  HENT:     Head: Normocephalic and atraumatic.  Eyes:     Conjunctiva/sclera: Conjunctivae normal.  Neck:     Musculoskeletal: Normal range of motion.  Cardiovascular:     Rate and Rhythm: Normal rate and regular rhythm.     Heart sounds: Normal heart sounds.  Pulmonary:     Effort: Pulmonary effort is normal.     Breath sounds: Normal breath sounds.  Abdominal:     General: Bowel sounds are normal.     Palpations: Abdomen is soft.  Lymphadenopathy:     Cervical: No cervical adenopathy.  Skin:    General: Skin is warm and dry.     Capillary Refill: Capillary refill takes less than 2 seconds.  Neurological:     Mental Status: She is alert and oriented to person, place, and time.  Psychiatric:  Behavior: Behavior normal.      Musculoskeletal Exam: C-spine limited range of motion.  Thoracic and lumbar spine limited range of motion.  Shoulder joints elbow joints wrist joints with good range of motion.  She has DIP and PIP thickening.  Left total hip replacement was doing well.  She has bilateral total knee replacement which discomfort without any effusion.  Ankles and MTPs been good range of motion.  She has generalized hyperalgesia and positive tender points.  CDAI Exam: CDAI Score: Not documented Patient Global Assessment: Not documented; Provider Global Assessment: Not documented Swollen: Not documented; Tender: Not documented Joint Exam   Not documented   There is currently no information documented on the homunculus. Go to the Rheumatology activity and complete the homunculus joint exam.  Investigation: No additional findings.  Imaging: No results found.  Recent  Labs: Lab Results  Component Value Date   WBC 9.3 11/11/2017   HGB 9.6 (L) 11/11/2017   PLT 214 11/11/2017   NA 141 11/11/2017   K 3.6 11/11/2017   CL 106 11/11/2017   CO2 28 11/11/2017   GLUCOSE 122 (H) 11/11/2017   BUN 10 11/11/2017   CREATININE 0.73 11/11/2017   BILITOT 0.8 11/09/2017   ALKPHOS 57 11/09/2017   AST 21 11/09/2017   ALT 18 11/09/2017   PROT 6.7 11/09/2017   ALBUMIN 3.6 11/09/2017   CALCIUM 8.3 (L) 11/11/2017   GFRAA >60 11/11/2017  April 18, 2018 LDL 72, CBC normal, CMP normal, vitamin D 77.8, hemoglobin A1c 5.6, protein creatinine ratio normal.  DEXA obtained at [facility] on very third 2020, T score -1.1 in right total femoral Speciality Comments: No specialty comments available.  Procedures:  No procedures performed Allergies: Sudafed [pseudoephedrine hcl]   Assessment / Plan:     Visit Diagnoses: Fibromyalgia -patient continues to have a lot of pain and discomfort from fibromyalgia.  She is currently on gabapentin, cymbalta, tramadol. UDS: 1/15/2019narc agreement: 04/05/2017.  Will be obtained today.  I detailed discussion with the patient regarding regular exercise and good sleep hygiene.  I would encourage her to join water aerobics.  Primary osteoarthritis of both hands-she has ongoing discomfort in her hands.  Trochanteric bursitis of both hips -she continues to have some discomfort.  Status post total hip replacement, left - 10/2017 by Dr. Cleophas Dunker.  Doing well.  History of total knee replacement, bilateral-she has chronic pain but her replacement has been successful.  I have given her a prescription for diclofenac gel that can be used topically.  Side effects were discussed.  Her most recent labs to Dr. Deneen Harts office were normal.  Primary insomnia-good sleep hygiene was discussed.  Osteopenia of multiple sites -  DXA she is on calcium and vitamin D.  History of hypertension-blood pressure is controlled.  History of sleep  apnea - Dr. Lucia Gaskins.  History of restless legs syndrome  History of gastroesophageal reflux (GERD)  Frequent falls -she is using a cane now..  Medication monitoring encounter - Plan: Pain Mgmt, Profile 5 w/Conf, U, Pain Mgmt, Tramadol w/medMATCH, U   Orders: Orders Placed This Encounter  Procedures  . Pain Mgmt, Profile 5 w/Conf, U  . Pain Mgmt, Tramadol w/medMATCH, U   Meds ordered this encounter  Medications  . diclofenac sodium (VOLTAREN) 1 % GEL    Sig: Apply 2 to 4 grams topically to affected joint up to four times a day.    Dispense:  4 Tube    Refill:  2  . traMADol (  ULTRAM) 50 MG tablet    Sig: TAKE 1 TABLET BY MOUTH TWO  TIMES DAILY AS NEEDED    Dispense:  60 tablet    Refill:  0    Face-to-face time spent with patient was 30 minutes. Greater than 50% of time was spent in counseling and coordination of care.  Follow-Up Instructions: Return in about 6 months (around 10/24/2018) for FMS, OA.   Pollyann SavoyShaili Jerime Arif, MD  Note - This record has been created using Animal nutritionistDragon software.  Chart creation errors have been sought, but may not always  have been located. Such creation errors do not reflect on  the standard of medical care.

## 2018-04-25 ENCOUNTER — Encounter: Payer: Self-pay | Admitting: Rheumatology

## 2018-04-25 ENCOUNTER — Telehealth: Payer: Self-pay | Admitting: Rheumatology

## 2018-04-25 ENCOUNTER — Ambulatory Visit: Payer: Medicare Other | Admitting: Rheumatology

## 2018-04-25 VITALS — BP 139/88 | HR 104 | Resp 18 | Ht 65.5 in | Wt 225.4 lb

## 2018-04-25 DIAGNOSIS — M19042 Primary osteoarthritis, left hand: Secondary | ICD-10-CM

## 2018-04-25 DIAGNOSIS — Z8719 Personal history of other diseases of the digestive system: Secondary | ICD-10-CM

## 2018-04-25 DIAGNOSIS — M797 Fibromyalgia: Secondary | ICD-10-CM

## 2018-04-25 DIAGNOSIS — Z8669 Personal history of other diseases of the nervous system and sense organs: Secondary | ICD-10-CM

## 2018-04-25 DIAGNOSIS — M7061 Trochanteric bursitis, right hip: Secondary | ICD-10-CM | POA: Diagnosis not present

## 2018-04-25 DIAGNOSIS — F5101 Primary insomnia: Secondary | ICD-10-CM

## 2018-04-25 DIAGNOSIS — M19041 Primary osteoarthritis, right hand: Secondary | ICD-10-CM | POA: Diagnosis not present

## 2018-04-25 DIAGNOSIS — M8589 Other specified disorders of bone density and structure, multiple sites: Secondary | ICD-10-CM

## 2018-04-25 DIAGNOSIS — Z96642 Presence of left artificial hip joint: Secondary | ICD-10-CM | POA: Diagnosis not present

## 2018-04-25 DIAGNOSIS — Z5181 Encounter for therapeutic drug level monitoring: Secondary | ICD-10-CM

## 2018-04-25 DIAGNOSIS — R296 Repeated falls: Secondary | ICD-10-CM

## 2018-04-25 DIAGNOSIS — Z8679 Personal history of other diseases of the circulatory system: Secondary | ICD-10-CM

## 2018-04-25 DIAGNOSIS — Z96653 Presence of artificial knee joint, bilateral: Secondary | ICD-10-CM

## 2018-04-25 DIAGNOSIS — M7062 Trochanteric bursitis, left hip: Secondary | ICD-10-CM

## 2018-04-25 MED ORDER — TRAMADOL HCL 50 MG PO TABS: ORAL_TABLET | ORAL | 0 refills | Status: DC

## 2018-04-25 MED ORDER — DICLOFENAC SODIUM 1 % TD GEL: TRANSDERMAL | 2 refills | Status: DC

## 2018-04-25 NOTE — Telephone Encounter (Signed)
Patients husband requesting a referral for patient to [facility] for [facility] physical therapy. Please call if referral is sent, or if questions.

## 2018-04-25 NOTE — Addendum Note (Signed)
Addended by: Henriette Combs on: 04/25/2018 01:35 PM   Modules accepted: Orders

## 2018-04-25 NOTE — Telephone Encounter (Signed)
Ok to provide referral to PT

## 2018-04-25 NOTE — Telephone Encounter (Signed)
Referral placed and advised patient's husband.

## 2018-04-28 ENCOUNTER — Other Ambulatory Visit: Payer: Self-pay | Admitting: Rheumatology

## 2018-04-28 MED ORDER — GABAPENTIN 300 MG PO CAPS: ORAL_CAPSULE | ORAL | 0 refills | Status: DC

## 2018-04-28 NOTE — Telephone Encounter (Signed)
Last Visit: 04/25/18 Next Visit: 10/18/18  Okay to refill Gabapentin per Dr. Corliss Skainseveshwar Prescription for Tramadol has already bee refilled.

## 2018-04-30 LAB — PAIN MGMT, PROFILE 5 W/CONF, U
Amphetamines: NEGATIVE ng/mL (ref <500)
Barbiturates: NEGATIVE ng/mL (ref <300)
Benzodiazepines: NEGATIVE ng/mL (ref <100)
CREATININE: 92.1 mg/dL
Cocaine Metabolite: NEGATIVE ng/mL (ref <150)
MARIJUANA METABOLITE: NEGATIVE ng/mL (ref <20)
Methadone Metabolite: NEGATIVE ng/mL (ref <100)
OXIDANT: NEGATIVE ug/mL (ref <200)
OXYCODONE: NEGATIVE ng/mL (ref <100)
Opiates: NEGATIVE ng/mL (ref <100)
PH: 6.85 (ref 4.5–9.0)

## 2018-04-30 LAB — PAIN MGMT, TRAMADOL W/MEDMATCH, U
DESMETHYLTRAMADOL: 1854 ng/mL — AB (ref <100)
TRAMADOL: 7905 ng/mL — AB (ref <100)

## 2018-05-09 ENCOUNTER — Encounter

## 2018-05-09 ENCOUNTER — Other Ambulatory Visit: Payer: Self-pay | Admitting: Podiatry

## 2018-05-09 ENCOUNTER — Ambulatory Visit (INDEPENDENT_AMBULATORY_CARE_PROVIDER_SITE_OTHER): Payer: Medicare Other

## 2018-05-09 ENCOUNTER — Encounter: Payer: Self-pay | Admitting: Podiatry

## 2018-05-09 ENCOUNTER — Ambulatory Visit: Payer: Medicare Other | Admitting: Podiatry

## 2018-05-09 VITALS — BP 138/75 | HR 81 | Resp 16

## 2018-05-09 DIAGNOSIS — M79671 Pain in right foot: Secondary | ICD-10-CM

## 2018-05-09 DIAGNOSIS — M7661 Achilles tendinitis, right leg: Secondary | ICD-10-CM | POA: Diagnosis not present

## 2018-05-09 MED ORDER — TRIAMCINOLONE ACETONIDE 10 MG/ML IJ SUSP
10.0000 mg | Freq: Once | INTRAMUSCULAR | Status: AC
  Administered 2018-05-09: 10 mg

## 2018-05-09 NOTE — Progress Notes (Signed)
   Subjective:    Patient ID: Miranda Romero, female    DOB: 1945/07/26, 73 y.o.   MRN: [id number]  HPI    Review of Systems  All other systems reviewed and are negative.      Objective:   Physical Exam        Assessment & Plan:

## 2018-05-09 NOTE — Patient Instructions (Signed)

## 2018-05-12 ENCOUNTER — Encounter: Payer: Self-pay | Admitting: Gastroenterology

## 2018-05-12 NOTE — Progress Notes (Signed)
Subjective:   Patient ID: Miranda Romero, female   DOB: 73 y.o.   MRN: [id number]   HPI Patient presents stating she is developed severe discomfort in the back of her right heel and has tried reduced activity and has tried shoe gear modifications without relief and she cannot walk.  States that she does have a trip coming up and she is desperate for relief and patient does not smoke and likes to be active   Review of Systems  All other systems reviewed and are negative.       Objective:  Physical Exam Vitals signs and nursing note reviewed.  Constitutional:      Appearance: She is well-developed.  Pulmonary:     Effort: Pulmonary effort is normal.  Musculoskeletal: Normal range of motion.  Skin:    General: Skin is warm.  Neurological:     Mental Status: She is alert.     Neurovascular status intact muscle strength is adequate range of motion within normal limits with patient found to have exquisite posterior heel pain right lateral side at the insertion of the Achilles calcaneus with a central medial side not affected and healthy.  Patient had no equinus condition noted and appeared to have strong muscle strength.  Patient has good digital perfusion and is well oriented x3     Assessment:  Acute Achilles tendinitis right with inflammation fluid at the insertion Achilles to the calcaneus     Plan:  H&P condition reviewed and discussed careful injection explaining risk.  Patient is willing to accept risk of procedure today I did sterile prep of the area and I injected the posterior lateral aspect of the Achilles staying away from the central medial side with 3 mg dexamethasone Kenalog 5 mg Xylocaine.  I then went ahead and applied air fracture walker to completely immobilize and allow complete rest and advised on stretching exercises ice therapy.  Reappoint to recheck in several weeks  X-ray indicates spur formation with mild arthritic processes but no indications of stress  fracture or other advanced process

## 2018-05-23 ENCOUNTER — Encounter: Payer: Self-pay | Admitting: Podiatry

## 2018-05-23 ENCOUNTER — Ambulatory Visit: Payer: Medicare Other | Admitting: Podiatry

## 2018-05-23 DIAGNOSIS — M7661 Achilles tendinitis, right leg: Secondary | ICD-10-CM

## 2018-05-23 DIAGNOSIS — M79671 Pain in right foot: Secondary | ICD-10-CM

## 2018-05-25 NOTE — Progress Notes (Signed)
Subjective:   Patient ID: Miranda Romero, female   DOB: 73 y.o.   MRN: [id number]   HPI Patient states she is improved with mild discomfort still noted but quite a bit better than it was previously.   ROS      Objective:  Physical Exam  Neurovascular status intact with posterior pain that is improved but is still present upon deep palpation     Assessment:  Achilles tendinitis improving but still painful     Plan:  H&P condition reviewed and recommended continued stretching exercises wider shoes and ice therapy and if symptoms were to persist will need to consider other treatment options

## 2018-06-01 ENCOUNTER — Telehealth: Payer: Self-pay | Admitting: Podiatry

## 2018-06-01 DIAGNOSIS — M7661 Achilles tendinitis, right leg: Secondary | ICD-10-CM

## 2018-06-01 DIAGNOSIS — M79671 Pain in right foot: Secondary | ICD-10-CM

## 2018-06-01 NOTE — Telephone Encounter (Signed)
I saw Dr. Charlsie Merles last week and he was supposed to be setting me up for PT but I have not heard anything.

## 2018-06-02 ENCOUNTER — Ambulatory Visit: Payer: Medicare Other | Admitting: Gastroenterology

## 2018-06-02 NOTE — Telephone Encounter (Signed)
She should have pt for her conditioon

## 2018-06-02 NOTE — Addendum Note (Signed)
Addended by: Alphia Kava D on: 06/02/2018 03:37 PM   Modules accepted: Orders

## 2018-06-02 NOTE — Telephone Encounter (Signed)
I informed pt Dr. Charlsie Merles ordered PT at [facility] and they would call her today or tomorrow. Hand delivered PT rx to [facility].

## 2018-06-09 ENCOUNTER — Other Ambulatory Visit: Payer: Self-pay | Admitting: Rheumatology

## 2018-06-10 ENCOUNTER — Telehealth: Payer: Self-pay | Admitting: Rheumatology

## 2018-06-10 MED ORDER — GABAPENTIN 300 MG PO CAPS: ORAL_CAPSULE | ORAL | 0 refills | Status: DC

## 2018-06-10 NOTE — Telephone Encounter (Signed)
Patient called requesting prescription refill of Gabapentin sent to [facility].

## 2018-06-10 NOTE — Telephone Encounter (Signed)
Patient advised we sent 90 day supply to [facility] on 04/28/18. Patient will contact pharmacy and then call the office if she has any issues.

## 2018-06-10 NOTE — Telephone Encounter (Signed)
Patient states she contacted the pharmacy and they states they have never received the prescription. Resent the prescription to the pharmacy.   Last Visit: 04/25/18 Next Visit: 10/18/18  Okay to refill per Dr. Corliss Skains

## 2018-06-10 NOTE — Addendum Note (Signed)
Addended by: Henriette Combs on: 06/10/2018 01:26 PM   Modules accepted: Orders

## 2018-06-20 ENCOUNTER — Ambulatory Visit: Payer: Medicare Other | Admitting: Podiatry

## 2018-08-03 ENCOUNTER — Other Ambulatory Visit: Payer: Self-pay | Admitting: Rheumatology

## 2018-08-04 NOTE — Telephone Encounter (Signed)
Last Visit: 04/25/18 Next Visit: 10/18/18  Okay to refill per Dr. Corliss Skains

## 2018-08-31 ENCOUNTER — Other Ambulatory Visit: Payer: Self-pay | Admitting: Rheumatology

## 2018-08-31 NOTE — Telephone Encounter (Signed)
Last Visit: 04/25/2018 Next Visit: 10/18/2018  Okay to refill per Dr. Estanislado Pandy.

## 2018-09-13 ENCOUNTER — Other Ambulatory Visit: Payer: Self-pay | Admitting: Rheumatology

## 2018-10-04 NOTE — Progress Notes (Deleted)
Office Visit Note  Patient: Miranda Romero             Date of Birth: 16-Jun-1945           MRN: [id number]             PCP: Gaspar Garbeisovec, Richard W, MD Referring: Gaspar Garbeisovec, Richard W, MD Visit Date: 10/18/2018 Occupation: @GUAROCC @  Subjective:  No chief complaint on file.   History of Present Illness: Miranda Romero is a 73 y.o. female ***   Activities of Daily Living:  Patient reports morning stiffness for *** {minute/hour:19697}.   Patient {ACTIONS;DENIES/REPORTS:21021675::"Denies"} nocturnal pain.  Difficulty dressing/grooming: {ACTIONS;DENIES/REPORTS:21021675::"Denies"} Difficulty climbing stairs: {ACTIONS;DENIES/REPORTS:21021675::"Denies"} Difficulty getting out of chair: {ACTIONS;DENIES/REPORTS:21021675::"Denies"} Difficulty using hands for taps, buttons, cutlery, and/or writing: {ACTIONS;DENIES/REPORTS:21021675::"Denies"}  No Rheumatology ROS completed.   PMFS History:  Patient Active Problem List   Diagnosis Date Noted  . Osteoarthritis of left hip 11/09/2017  . S/P hip replacement, left 11/09/2017  . Primary osteoarthritis of both hips 09/25/2016  . Fibromyalgia 04/28/2016  . Primary osteoarthritis of both knees 04/28/2016  . History of total knee replacement, bilateral 04/28/2016  . Primary osteoarthritis of both hands 04/28/2016  . Primary insomnia 04/28/2016  . History of sleep apnea 04/28/2016  . History of restless legs syndrome 04/28/2016  . Osteopenia of multiple sites 04/28/2016  . Trochanteric bursitis of left hip 02/03/2016  . Hiatal hernia with gastroesophageal reflux 11/06/2015  . Hiatal hernia 03/25/2015  . Gastroesophageal reflux disease with esophagitis 01/17/2015  . Ligamentous laxity of knee 01/05/2011  . Knee effusion, right 11/10/2010  . Knee pain 11/10/2010  . Incisional hernia 10/16/2010  . HYPERTENSION 05/14/2010  . OBSTRUCTIVE SLEEP APNEA 05/14/2010  . Obesity 04/08/2009    Past Medical History:  Diagnosis Date  . Arthritis    . Blood transfusion   . Fibromyalgia    Dr Corliss Skainseveshwar  . GERD (gastroesophageal reflux disease)   . Hiatal hernia with gastroesophageal reflux 11/06/2015  . History of hiatal hernia   . Hyperlipidemia   . Hypertension    Dr Wylene Simmerisovec LOV 4/13 on chart  . Seasonal allergies   . Sleep apnea    SEVERE  per sleep study 3/12 EPIC no CPAP   . Transfusion history    3 yrs ago s/p surgery for knee  . Umbilical hernia   . Weight decrease     Family History  Problem Relation Age of Onset  . Lymphoma Mother    Past Surgical History:  Procedure Laterality Date  . ANKLE SURGERY    . APPENDECTOMY  2014  . BALLOON DILATION N/A 06/05/2015   Procedure: BALLOON DILATION;  Surgeon: Willis ModenaWilliam Outlaw, MD;  [facility];  Service: Endoscopy;  Laterality: N/A;  . CATARACT EXTRACTION Bilateral 05/2013  . CHOLECYSTECTOMY    . DILATION AND CURETTAGE OF UTERUS    . ESOPHAGOGASTRODUODENOSCOPY (EGD) WITH PROPOFOL N/A 06/05/2015   Procedure: ESOPHAGOGASTRODUODENOSCOPY (EGD) WITH PROPOFOL;  Surgeon: Willis ModenaWilliam Outlaw, MD;  [facility];  Service: Endoscopy;  Laterality: N/A;  . HERNIA REPAIR  05/2013   due to appendectomy with mesh  . HIATAL HERNIA REPAIR N/A 11/06/2015   Procedure: LAPAROSCOPIC REPAIR HIATAL HERNIA, Nissen FUNDOPLICATION;  Surgeon: Claud KelpHaywood Ingram, MD;  Location: [facility];  Service: General;  Laterality: N/A;  . JOINT REPLACEMENT    . KNEE ARTHROPLASTY Bilateral   . knee infection Right    knee hardware removed 11/12/   replaced 1/13  . TOTAL HIP ARTHROPLASTY Left 11/09/2017   Procedure: LEFT  TOTAL HIP ARTHROPLASTY;  Surgeon: Garald Balding, MD;  Location: [place];  Service: Orthopedics;  Laterality: Left;  Marland Kitchen VENTRAL HERNIA REPAIR  07/21/2011   Procedure: LAPAROSCOPIC VENTRAL HERNIA;  Surgeon: Adin Hector, MD;  Location: [facility];  Service: General;  Laterality: N/A;  Laparoscopic Repair Verntal Incisional Hernia and Mesh   Social History   Social History Narrative   Lives at  home with her husband   They have second home in [place] where they spend a lot of time   Right handed   Caffeine: 2 cups daily   Immunization History  Administered Date(s) Administered  . Influenza Whole 11/18/2009  . Influenza, High Dose Seasonal PF 01/05/2017, 12/09/2017  . Influenza-Unspecified 11/29/2010, 01/18/2015, 11/30/2015  . Pneumococcal Polysaccharide-23 11/29/2007, 04/16/2008  . Tdap 04/21/2018  . Zoster Recombinat (Shingrix) 10/18/2017     Objective: Vital Signs: LMP 09/20/2000 (Exact Date)    Physical Exam   Musculoskeletal Exam: ***  CDAI Exam: CDAI Score: - Patient Global: -; Provider Global: - Swollen: -; Tender: - Joint Exam   No joint exam has been documented for this visit   There is currently no information documented on the homunculus. Go to the Rheumatology activity and complete the homunculus joint exam.  Investigation: No additional findings.  Imaging: No results found.  Recent Labs: Lab Results  Component Value Date   WBC 9.3 11/11/2017   HGB 9.6 (L) 11/11/2017   PLT 214 11/11/2017   NA 141 11/11/2017   K 3.6 11/11/2017   CL 106 11/11/2017   CO2 28 11/11/2017   GLUCOSE 122 (H) 11/11/2017   BUN 10 11/11/2017   CREATININE 0.73 11/11/2017   BILITOT 0.8 11/09/2017   ALKPHOS 57 11/09/2017   AST 21 11/09/2017   ALT 18 11/09/2017   PROT 6.7 11/09/2017   ALBUMIN 3.6 11/09/2017   CALCIUM 8.3 (L) 11/11/2017   GFRAA >60 11/11/2017    Speciality Comments: No specialty comments available.  Procedures:  No procedures performed Allergies: Sudafed [pseudoephedrine hcl]   Assessment / Plan:     Visit Diagnoses: No diagnosis found.  Orders: No orders of the defined types were placed in this encounter.  No orders of the defined types were placed in this encounter.   Face-to-face time spent with patient was *** minutes. Greater than 50% of time was spent in counseling and coordination of care.  Follow-Up Instructions: No follow-ups on  file.   Earnestine Mealing, CMA  Note - This record has been created using Editor, commissioning.  Chart creation errors have been sought, but may not always  have been located. Such creation errors do not reflect on  the standard of medical care.

## 2018-10-10 ENCOUNTER — Telehealth: Payer: Self-pay | Admitting: Rheumatology

## 2018-10-10 NOTE — Progress Notes (Signed)
Virtual Visit via Telephone Note  I connected with Miranda Romero on 10/10/18 at 12:20 PM EDT by telephone and verified that I am speaking with the correct person using two identifiers.  Location: Patient: Home  Provider: Clinic This service was conducted via virtual visit.  The patient was located at home. I was located in my office.  Consent was obtained prior to the virtual visit and is aware of possible charges through their insurance for this visit.  The patient is an established patient.  Dr. Estanislado Pandy, MD conducted the virtual visit and Hazel Sams, PA-C acted as scribe during the service.  Office staff helped with scheduling follow up visits after the service was conducted.   I discussed the limitations, risks, security and privacy concerns of performing an evaluation and management service by telephone and the availability of in person appointments. I also discussed with the patient that there may be a patient responsible charge related to this service. The patient expressed understanding and agreed to proceed.  CC: Pain in multiple joints  History of Present Illness: Patient is a 73 year old female with a past medical history of fibromyalgia and osteoarthritis.  She takes Cymbalta 30 mg by mouth at bedtime, gabapentin 300 mg 1 capsule by mouth twice daily, and tramadol 50 mg 1 tablet by mouth twice daily as needed for pain relief.  She uses Voltaren gel topically as needed for pain relief.  She continues to have generalized muscle aches and muscle tenderness.  She has bilateral trochanteric bursitis.  She states her left hip replacement is doing well.  She has pain in both knee replacements.  She has been riding a stationary bike.   Review of Systems  Constitutional: Positive for malaise/fatigue. Negative for fever.  Eyes: Negative for photophobia, pain, discharge and redness.  Respiratory: Negative for cough, shortness of breath and wheezing.   Cardiovascular: Negative for chest  pain and palpitations.  Gastrointestinal: Negative for blood in stool, constipation and diarrhea.  Genitourinary: Negative for dysuria.  Musculoskeletal: Positive for joint pain and myalgias. Negative for back pain and neck pain.  Skin: Negative for rash.  Neurological: Negative for dizziness and headaches.  Psychiatric/Behavioral: Negative for depression. The patient has insomnia. The patient is not nervous/anxious.       Observations/Objective: Physical Exam  Constitutional: She is oriented to person, place, and time.  Neurological: She is alert and oriented to person, place, and time.  Psychiatric: Mood, memory, affect and judgment normal.   BP 103/73   Patient reports joint stiffness all day Patient reports nocturnal pain.  Difficulty dressing/grooming: Denies Difficulty climbing stairs: Reports Difficulty getting out of chair: Reports Difficulty using hands for taps, buttons, cutlery, and/or writing: Reports   Assessment and Plan: Visit Diagnoses: Fibromyalgia -She continues to have generalized muscle aches and muscle tenderness.  She states her pain is currently a 8/10. She has bilateral trochanteric bursitis. She experiences nocturnal pain. She was encouraged to perform stretching exercises. She has been riding a stationary bike for exercise. She takes Cymbalta 30 mg by mouth at bedtime, gabapentin 300 mg 1 capsule by mouth twice daily, and tramadol 50 mg 1 tablet by mouth twice daily as needed for pain relief.  She uses Voltaren gel topically as needed for pain relief.  She has chronic fatigue related to insomnia. She does not need any refills at this time. She will follow up in 6 months.   Medication monitoring encounter -She is taking tramadol 50 mg 1 tablet twice daily  as needed for pain relief.  UDS and narcotic agreement will need to be updated.  Future order for UDS was placed today.  Plan: Pain Mgmt, Profile 5 w/Conf, U, Pain Mgmt, Tramadol w/medMATCH, U   Primary  osteoarthritis of both hands-She has intermittent pain in both hands.  She has no joint swelling.  Joint protection and muscle strengthening were discussed.   Trochanteric bursitis of both hips -She has tenderness over bilateral trochanteric bursitis.  She was encouraged to wear stretching exercises regularly.   Status post total hip replacement, left - 10/2017 by Dr. Cleophas DunkerWhitfield.  Doing well.  History of total knee replacement, bilateral-She has intermittent pain in both knee replacements.    Primary insomnia-Good sleep hygiene was discussed.  Osteopenia of multiple sites -  DEXA 04/18/28 BMD 0.809 with T-score -1.1. She is taking on calcium and vitamin D.  Other medical conditions are listed as follows:   History of hypertension-blood pressure is controlled.  History of sleep apnea - Dr. Lucia GaskinsAhern.  History of restless legs syndrome  History of gastroesophageal reflux (GERD)  Frequent falls -She walks with a cane.     Follow Up Instructions: She will follow up in   I discussed the assessment and treatment plan with the patient. The patient was provided an opportunity to ask questions and all were answered. The patient agreed with the plan and demonstrated an understanding of the instructions.   The patient was advised to call back or seek an in-person evaluation if the symptoms worsen or if the condition fails to improve as anticipated.  I provided 25 minutes of non-face-to-face time during this encounter. Pollyann SavoyShaili Deveshwar, MD   Scribed by-  Gearldine Bienenstockaylor M Dale, PA-C

## 2018-10-10 NOTE — Telephone Encounter (Signed)
Patient has a 6 month follow up appt coming up, and would like to know if she really needs to keep it or not? Patient would rather not come in if she does not have to. Per patient, she is doing well with no problems. Patient knows she can do a virtual if this appt is necessary. Please call to advise.

## 2018-10-10 NOTE — Telephone Encounter (Signed)
Patient advised that we do need to do a follow up visit as we prescribe medication. Patient has been scheduled a telephone visit on 10/21/18.

## 2018-10-18 ENCOUNTER — Ambulatory Visit: Payer: Self-pay | Admitting: Rheumatology

## 2018-10-21 ENCOUNTER — Encounter: Payer: Self-pay | Admitting: Rheumatology

## 2018-10-21 ENCOUNTER — Telehealth (INDEPENDENT_AMBULATORY_CARE_PROVIDER_SITE_OTHER): Payer: Medicare Other | Admitting: Rheumatology

## 2018-10-21 ENCOUNTER — Other Ambulatory Visit: Payer: Self-pay

## 2018-10-21 DIAGNOSIS — M19041 Primary osteoarthritis, right hand: Secondary | ICD-10-CM | POA: Diagnosis not present

## 2018-10-21 DIAGNOSIS — F5101 Primary insomnia: Secondary | ICD-10-CM

## 2018-10-21 DIAGNOSIS — Z8669 Personal history of other diseases of the nervous system and sense organs: Secondary | ICD-10-CM

## 2018-10-21 DIAGNOSIS — M19042 Primary osteoarthritis, left hand: Secondary | ICD-10-CM

## 2018-10-21 DIAGNOSIS — M797 Fibromyalgia: Secondary | ICD-10-CM

## 2018-10-21 DIAGNOSIS — M7061 Trochanteric bursitis, right hip: Secondary | ICD-10-CM | POA: Diagnosis not present

## 2018-10-21 DIAGNOSIS — Z8679 Personal history of other diseases of the circulatory system: Secondary | ICD-10-CM

## 2018-10-21 DIAGNOSIS — M7062 Trochanteric bursitis, left hip: Secondary | ICD-10-CM

## 2018-10-21 DIAGNOSIS — Z96653 Presence of artificial knee joint, bilateral: Secondary | ICD-10-CM

## 2018-10-21 DIAGNOSIS — Z96642 Presence of left artificial hip joint: Secondary | ICD-10-CM | POA: Diagnosis not present

## 2018-10-21 DIAGNOSIS — Z8719 Personal history of other diseases of the digestive system: Secondary | ICD-10-CM

## 2018-10-21 DIAGNOSIS — Z5181 Encounter for therapeutic drug level monitoring: Secondary | ICD-10-CM

## 2018-10-21 DIAGNOSIS — M8589 Other specified disorders of bone density and structure, multiple sites: Secondary | ICD-10-CM

## 2018-10-25 ENCOUNTER — Telehealth: Payer: Self-pay | Admitting: Rheumatology

## 2018-10-25 NOTE — Telephone Encounter (Signed)
LMOM for patient to call and schedule 6 month follow-up appointment ?

## 2018-10-25 NOTE — Telephone Encounter (Signed)
-----   Message from Kiefer sent at 10/21/2018  2:28 PM EDT ----- Patient had a virtual visit today with Dr. Estanislado Pandy, please call to schedule 6 month follow up. Thanks!

## 2018-10-27 ENCOUNTER — Other Ambulatory Visit: Payer: Self-pay | Admitting: Rheumatology

## 2018-10-28 NOTE — Telephone Encounter (Signed)
Please schedule patient for a follow up appointment. Patient due February 2020. Thanks!

## 2018-10-28 NOTE — Telephone Encounter (Signed)
Last Visit: 10/21/18 Next Visit: due in February 2020. Message sent to the front to schedule   Okay to refill per Dr. Estanislado Pandy

## 2018-10-28 NOTE — Telephone Encounter (Signed)
Called to schedule follow-up appointment and spoke with Jeneen Rinks (patient's husband) who stated [place] was "resting" and would have her return our call.

## 2018-11-04 ENCOUNTER — Other Ambulatory Visit: Payer: Self-pay

## 2018-11-04 DIAGNOSIS — Z5181 Encounter for therapeutic drug level monitoring: Secondary | ICD-10-CM

## 2018-11-06 LAB — PAIN MGMT, PROFILE 5 W/CONF, U
Amphetamines: NEGATIVE ng/mL
Barbiturates: NEGATIVE ng/mL
Benzodiazepines: NEGATIVE ng/mL
Cocaine Metabolite: NEGATIVE ng/mL
Creatinine: 213.2 mg/dL
Marijuana Metabolite: NEGATIVE ng/mL
Methadone Metabolite: NEGATIVE ng/mL
Opiates: NEGATIVE ng/mL
Oxidant: NEGATIVE ug/mL
Oxycodone: NEGATIVE ng/mL
pH: 6.1 (ref 4.5–9.0)

## 2018-11-06 LAB — PAIN MGMT, TRAMADOL W/MEDMATCH, U
Desmethyltramadol: 4435 ng/mL
Tramadol: >10000 ng/mL

## 2018-11-07 NOTE — Progress Notes (Signed)
UDS is consistent with treatment.

## 2018-12-22 ENCOUNTER — Other Ambulatory Visit: Payer: Self-pay | Admitting: Rheumatology

## 2018-12-22 NOTE — Telephone Encounter (Signed)
yes

## 2018-12-22 NOTE — Telephone Encounter (Signed)
Last Visit: 10/21/18 Next Visit: 04/24/19 UDS: 11/04/18  Narc Agreement: 11/04/18  Okay to refill Tramadol, Cymbalta and Voltaren Gel?

## 2019-01-21 ENCOUNTER — Other Ambulatory Visit: Payer: Self-pay | Admitting: Rheumatology

## 2019-01-23 NOTE — Telephone Encounter (Addendum)
Last Visit: 10/21/18 Next Visit: 04/24/19 UDS: 11/04/18  Narc Agreement: 11/04/18  Okay to refill Tramadol, Gabapentin and Voltaren Gel?

## 2019-02-28 ENCOUNTER — Other Ambulatory Visit: Payer: Self-pay | Admitting: Rheumatology

## 2019-02-28 NOTE — Telephone Encounter (Signed)
Last Visit: 10/21/2018 telemedicine  Next Visit: 04/24/2019 UDS: 11/04/2018 c/w Narc Agreement: 04/25/2018  Last fill: 01/23/2019   Okay to refill tramadol?

## 2019-03-13 ENCOUNTER — Other Ambulatory Visit: Payer: Self-pay

## 2019-03-13 NOTE — Telephone Encounter (Signed)
Received letter from the patient stating her insurance and pharmacy will be changing, effective 03/17/2019. Patient is requesting new prescriptions to be sent to [place]. Patient is requesting tramadol, gabapentin, cymbalta and voltaren gel.   Last Visit: 10/21/2018 telemedicine  Next Visit: 04/24/2019 UDS:11/04/2018 c/w Narc Agreement: 04/25/2018  Last fill:  02/28/2019 ([facility]). 01/23/2019 (gabapentin [facility]). 12/22/2018 (cymbalta to [facility]).    Okay to refill tramadol, gabapentin, cymbalta and voltaren gel? I have indicated "do not fill until (elegible fill date)" on both tramadol and gabapentin.

## 2019-03-14 MED ORDER — DICLOFENAC SODIUM 1 % EX GEL: CUTANEOUS | 2 refills | Status: DC

## 2019-03-14 MED ORDER — TRAMADOL HCL 50 MG PO TABS: 50.0000 mg | ORAL_TABLET | Freq: Two times a day (BID) | ORAL | 0 refills | Status: DC | PRN

## 2019-03-14 MED ORDER — DULOXETINE HCL 30 MG PO CPEP: 30.0000 mg | ORAL_CAPSULE | Freq: Every day | ORAL | 0 refills | Status: DC

## 2019-03-14 MED ORDER — GABAPENTIN 300 MG PO CAPS: 300.0000 mg | ORAL_CAPSULE | Freq: Two times a day (BID) | ORAL | 0 refills | Status: DC

## 2019-03-28 ENCOUNTER — Telehealth: Payer: Self-pay

## 2019-03-28 NOTE — Telephone Encounter (Signed)
Received application for handicap renewal form in the mail from patient. Patient provided a pre-addressed, stamped envelope to mail to the [facility] DMV upon Dr. Corliss Skains signing the form. Dr. Corliss Skains has signed it and I have mailed in pre-addressed stamped envelope provided by patient.   Attempted to contact patient and left message on machine to advise patient.

## 2019-04-13 ENCOUNTER — Ambulatory Visit: Payer: Medicare Other

## 2019-04-14 NOTE — Progress Notes (Signed)
Virtual Visit via Telephone Note  I connected with Miranda Romero on 04/24/19 at 10:30 AM EST by telephone and verified that I am speaking with the correct person using two identifiers.  Location: Patient: Home  Provider: Clinic  This service was conducted via virtual visit.   The patient was located at home. I was located in my office.  Consent was obtained prior to the virtual visit and is aware of possible charges through their insurance for this visit.  The patient is an established patient.  Dr. Estanislado Pandy, MD conducted the virtual visit and Hazel Sams, PA-C acted as scribe during the service.  Office staff helped with scheduling follow up visits after the service was conducted.   I discussed the limitations, risks, security and privacy concerns of performing an evaluation and management service by telephone and the availability of in person appointments. I also discussed with the patient that there may be a patient responsible charge related to this service. The patient expressed understanding and agreed to proceed.  CC: Generalized pain  History of Present Illness: Patient is a 74 year old female with a past medical history of fibromyalgia and osteoarthritis. She has generalized muscle aches and muscle tenderness due to fibromyalgia.  She has been experiencing severe pain over the past few months.  She takes tramadol 50 mg 1 tablet daily and occasionally twice daily for pain relief.  She tries to take tylenol for breakthrough pain. She continues to take Cymbalta 30 mg 1 capsule daily and gabapentin 300 mg BID.     She has been experiencing intermittent dizziness when rising from a seated position over the past 1 year.  She states her blood pressure medication was reduced by her PCP recently.   She received her first covid-19 vaccine on 04/22/19 and is scheduled for her next injection on 05/17/19.    Review of Systems  Constitutional: Positive for malaise/fatigue. Negative for fever.  HENT:  Negative for congestion.   Eyes: Negative for photophobia, pain, discharge and redness.  Respiratory: Negative for cough, shortness of breath and wheezing.   Cardiovascular: Negative for chest pain, palpitations and leg swelling.  Gastrointestinal: Negative for blood in stool, constipation and diarrhea.  Genitourinary: Negative for dysuria and frequency.  Musculoskeletal: Positive for joint pain. Negative for back pain, myalgias and neck pain.  Skin: Negative for rash.  Neurological: Positive for dizziness. Negative for headaches.  Psychiatric/Behavioral: Negative for depression and memory loss. The patient is not nervous/anxious and does not have insomnia.       Observations/Objective: Physical Exam  Constitutional: She is oriented to person, place, and time.  Neurological: She is alert and oriented to person, place, and time.  Psychiatric: Mood, memory, affect and judgment normal.   Patient reports morning stiffness for 4 hours.   Patient reports nocturnal pain.  Difficulty dressing/grooming: Denies Difficulty climbing stairs: Reports Difficulty getting out of chair: Denies Difficulty using hands for taps, buttons, cutlery, and/or writing: Denies   Assessment and Plan: Visit Diagnoses:Fibromyalgia-She continues to have generalized muscle aches and muscle tenderness due to fibromyalgia.  She has been experiencing increased generalized pain for the past several months. She takes Cymbalta 30 mg by mouth at bedtime, gabapentin 300 mg 1 capsule by mouth twice daily, and tramadol 50 mg 1 tablet by mouth twice daily as needed for pain relief. She tries to take tylenol for breakthrough pain instead of taking the second dose of tramadol daily. She continues to take Magnesium and Coenzyme Q10 daily.  We discussed the importance of regular exercise and good sleep hygiene. She will follow up in 6 months.   Medication monitoring encounter -She is taking tramadol 50 mg 1 tablet twice daily as  needed for pain relief.  UDS updated on 11/04/18. She is due to update UDS this month.  Orders will be placed today.   Primary osteoarthritis of both hands-She has intermittent pain in both hands. She is not having any joint inflammation at this time. She continues to quilt on a regular basis. Joint protection and muscle strengthening were discussed.   Trochanteric bursitis of both hips -She has intermittent trochanteric bursitis bilaterally. She was encouraged to perform stretching exercises.   Status post total hip replacement, left - 10/2017 by Dr. Cleophas Dunker. Doing well.  History of total knee replacement, bilateral-She has intermittent pain in both knee replacements. She uses voltaren gel topically as needed for pain relief. She walks with a cane to assist with ambulation.  Primary insomnia-Good sleep hygiene was discussed.  Osteopenia of multiple sites - DEXA 04/18/18 BMD 0.809 with T-score -1.1. She is taking a calcium and vitamin D supplement.   Other medical conditions are listed as follows:   History of hypertension  History of sleep apnea - Dr. Lucia Gaskins.  History of restless legs syndrome  History of gastroesophageal reflux (GERD)  Frequent falls -She walks with a cane.   Follow Up Instructions: She will follow up in 6 months.    I discussed the assessment and treatment plan with the patient. The patient was provided an opportunity to ask questions and all were answered. The patient agreed with the plan and demonstrated an understanding of the instructions.   The patient was advised to call back or seek an in-person evaluation if the symptoms worsen or if the condition fails to improve as anticipated.  I provided 30 minutes of non-face-to-face time during this encounter.   Pollyann Savoy, MD   Scribed by-  Sherron Ales, PA-C

## 2019-04-18 ENCOUNTER — Other Ambulatory Visit: Payer: Self-pay | Admitting: Rheumatology

## 2019-04-20 LAB — IFOBT (OCCULT BLOOD): IFOBT: NEGATIVE

## 2019-04-21 ENCOUNTER — Other Ambulatory Visit: Payer: Self-pay | Admitting: Rheumatology

## 2019-04-22 ENCOUNTER — Ambulatory Visit: Payer: Medicare PPO | Attending: Internal Medicine

## 2019-04-22 DIAGNOSIS — Z23 Encounter for immunization: Secondary | ICD-10-CM

## 2019-04-22 NOTE — Progress Notes (Signed)
   Covid-19 Vaccination Clinic  Name:  [place] F Salameh    MRN: [id number] DOB: [birth year]  04/22/2019  Ms. Mahajan was observed post Covid-19 immunization for 15 minutes without incidence. She was provided with Vaccine Information Sheet and instruction to access the V-Safe system.   Ms. Panther was instructed to call 911 with any severe reactions post vaccine: Marland Kitchen Difficulty breathing  . Swelling of your face and throat  . A fast heartbeat  . A bad rash all over your body  . Dizziness and weakness    Immunizations Administered    Name Date Dose VIS Date Route   Pfizer COVID-19 Vaccine 04/22/2019  9:35 AM 0.3 mL 02/24/2019 Intramuscular   Manufacturer: [employer], [employer]   Lot: [id number]   NDC: [address]-2

## 2019-04-24 ENCOUNTER — Other Ambulatory Visit: Payer: Self-pay

## 2019-04-24 ENCOUNTER — Encounter: Payer: Self-pay | Admitting: Rheumatology

## 2019-04-24 ENCOUNTER — Telehealth (INDEPENDENT_AMBULATORY_CARE_PROVIDER_SITE_OTHER): Payer: Medicare PPO | Admitting: Rheumatology

## 2019-04-24 DIAGNOSIS — M8589 Other specified disorders of bone density and structure, multiple sites: Secondary | ICD-10-CM

## 2019-04-24 DIAGNOSIS — Z96642 Presence of left artificial hip joint: Secondary | ICD-10-CM

## 2019-04-24 DIAGNOSIS — M7061 Trochanteric bursitis, right hip: Secondary | ICD-10-CM

## 2019-04-24 DIAGNOSIS — M19042 Primary osteoarthritis, left hand: Secondary | ICD-10-CM

## 2019-04-24 DIAGNOSIS — M797 Fibromyalgia: Secondary | ICD-10-CM

## 2019-04-24 DIAGNOSIS — Z96653 Presence of artificial knee joint, bilateral: Secondary | ICD-10-CM

## 2019-04-24 DIAGNOSIS — Z8669 Personal history of other diseases of the nervous system and sense organs: Secondary | ICD-10-CM

## 2019-04-24 DIAGNOSIS — M19041 Primary osteoarthritis, right hand: Secondary | ICD-10-CM

## 2019-04-24 DIAGNOSIS — F5101 Primary insomnia: Secondary | ICD-10-CM

## 2019-04-24 DIAGNOSIS — M7062 Trochanteric bursitis, left hip: Secondary | ICD-10-CM

## 2019-04-24 DIAGNOSIS — Z5181 Encounter for therapeutic drug level monitoring: Secondary | ICD-10-CM

## 2019-04-24 DIAGNOSIS — Z8719 Personal history of other diseases of the digestive system: Secondary | ICD-10-CM

## 2019-04-24 DIAGNOSIS — Z8679 Personal history of other diseases of the circulatory system: Secondary | ICD-10-CM

## 2019-04-28 ENCOUNTER — Telehealth: Payer: Self-pay

## 2019-04-28 NOTE — Telephone Encounter (Signed)
Lab results received via fax from [facility].  04/20/2019  UA, microalbumin/creatinine, hemosure, CMP, CBC, LIPID, apolipoprotein B, vitamin D.  UTI noted and treated with Keflex by PCP. Glucose 114, RDW 12.2, MPV 5.6, triglycerides 186, HDL 74, LDL/HDL ratio 1.0.  Labs reviewed by Sherron Ales, PA-C. Will send to the scan center.

## 2019-05-04 ENCOUNTER — Ambulatory Visit: Payer: Medicare Other

## 2019-05-14 NOTE — Progress Notes (Signed)
Labs from [facility], May 13, 2019 CMP normal except mild hyperglycemia, CBC normal, lipid panel showed elevated triglycerides.

## 2019-05-17 ENCOUNTER — Ambulatory Visit: Payer: Medicare PPO | Attending: Internal Medicine

## 2019-05-17 DIAGNOSIS — Z23 Encounter for immunization: Secondary | ICD-10-CM

## 2019-05-17 NOTE — Progress Notes (Signed)
   Covid-19 Vaccination Clinic  Name:  Miranda Romero    MRN: [id number] DOB: 1946-01-25  05/17/2019  Ms. Miranda Romero was observed post Covid-19 immunization for 15 minutes without incident. She was provided with Vaccine Information Sheet and instruction to access the V-Safe system.   Ms. Miranda Romero was instructed to call 911 with any severe reactions post vaccine: Marland Kitchen Difficulty breathing  . Swelling of face and throat  . A fast heartbeat  . A bad rash all over body  . Dizziness and weakness   Immunizations Administered    Name Date Dose VIS Date Route   Pfizer COVID-19 Vaccine 05/17/2019  8:55 AM 0.3 mL 02/24/2019 Intramuscular   Manufacturer: [employer], [employer]   Lot: [id number]   NDC: [address]-8

## 2019-05-24 ENCOUNTER — Other Ambulatory Visit: Payer: Self-pay | Admitting: *Deleted

## 2019-05-24 ENCOUNTER — Other Ambulatory Visit: Payer: Self-pay | Admitting: Rheumatology

## 2019-05-24 NOTE — Telephone Encounter (Signed)
Refill request received via fax from [facility]. Spoke with patient to advise she is due to update UDS and narcotic agreement. Patient states she has plenty of Tramadol and does not need a refill at this time.

## 2019-08-02 DIAGNOSIS — K449 Diaphragmatic hernia without obstruction or gangrene: Secondary | ICD-10-CM | POA: Diagnosis not present

## 2019-08-10 ENCOUNTER — Telehealth: Payer: Self-pay | Admitting: *Deleted

## 2019-08-10 NOTE — Telephone Encounter (Signed)
Received a refill request from [facility] for a Tramadol refill. Patient is due to update UDS and narcotic agreement. Spoke with patient to advise. Patient states she does not need the medication refilled at this time.

## 2019-10-10 NOTE — Progress Notes (Deleted)
Office Visit Note  Patient: Miranda Romero             Date of Birth: 08/08/1945           MRN: [id number]             PCP: Gaspar Garbe, MD Referring: Gaspar Garbe, MD Visit Date: 10/24/2019 Occupation: @GUAROCC @  Subjective:  No chief complaint on file.   History of Present Illness: F Llorens is a 74 y.o. female ***   Activities of Daily Living:  Patient reports morning stiffness for *** {minute/hour:19697}.   Patient {ACTIONS;DENIES/REPORTS:21021675::"Denies"} nocturnal pain.  Difficulty dressing/grooming: {ACTIONS;DENIES/REPORTS:21021675::"Denies"} Difficulty climbing stairs: {ACTIONS;DENIES/REPORTS:21021675::"Denies"} Difficulty getting out of chair: {ACTIONS;DENIES/REPORTS:21021675::"Denies"} Difficulty using hands for taps, buttons, cutlery, and/or writing: {ACTIONS;DENIES/REPORTS:21021675::"Denies"}  No Rheumatology ROS completed.   PMFS History:  Patient Active Problem List   Diagnosis Date Noted  . Osteoarthritis of left hip 11/09/2017  . S/P hip replacement, left 11/09/2017  . Primary osteoarthritis of both hips 09/25/2016  . Fibromyalgia 04/28/2016  . Primary osteoarthritis of both knees 04/28/2016  . History of total knee replacement, bilateral 04/28/2016  . Primary osteoarthritis of both hands 04/28/2016  . Primary insomnia 04/28/2016  . History of sleep apnea 04/28/2016  . History of restless legs syndrome 04/28/2016  . Osteopenia of multiple sites 04/28/2016  . Trochanteric bursitis of left hip 02/03/2016  . Hiatal hernia with gastroesophageal reflux 11/06/2015  . Hiatal hernia 03/25/2015  . Gastroesophageal reflux disease with esophagitis 01/17/2015  . Ligamentous laxity of knee 01/05/2011  . Knee effusion, right 11/10/2010  . Knee pain 11/10/2010  . Incisional hernia 10/16/2010  . HYPERTENSION 05/14/2010  . OBSTRUCTIVE SLEEP APNEA 05/14/2010  . Obesity 04/08/2009    Past Medical History:  Diagnosis Date  . Arthritis     . Blood transfusion   . Fibromyalgia    Dr 04/10/2009  . GERD (gastroesophageal reflux disease)   . Hiatal hernia with gastroesophageal reflux 11/06/2015  . History of hiatal hernia   . Hyperlipidemia   . Hypertension    Dr 11/08/2015 LOV 4/13 on chart  . Seasonal allergies   . Sleep apnea    SEVERE  per sleep study 3/12 EPIC no CPAP   . Transfusion history    3 yrs ago s/p surgery for knee  . Umbilical hernia   . Weight decrease     Family History  Problem Relation Age of Onset  . Lymphoma Mother    Past Surgical History:  Procedure Laterality Date  . ANKLE SURGERY    . APPENDECTOMY  2014  . BALLOON DILATION N/A 06/05/2015   Procedure: BALLOON DILATION;  Surgeon: 06/07/2015, MD;  Location: [facility];  Service: Endoscopy;  Laterality: N/A;  . CATARACT EXTRACTION Bilateral 05/2013  . CHOLECYSTECTOMY    . DILATION AND CURETTAGE OF UTERUS    . ESOPHAGOGASTRODUODENOSCOPY (EGD) WITH PROPOFOL N/A 06/05/2015   Procedure: ESOPHAGOGASTRODUODENOSCOPY (EGD) WITH PROPOFOL;  Surgeon: 06/07/2015, MD;  Location: [facility];  Service: Endoscopy;  Laterality: N/A;  . HERNIA REPAIR  05/2013   due to appendectomy with mesh  . HIATAL HERNIA REPAIR N/A 11/06/2015   Procedure: LAPAROSCOPIC REPAIR HIATAL HERNIA, [place]issen FUNDOPLICATION;  Surgeon: 11/08/2015, MD;  Location: [facility];  Service: General;  Laterality: N/A;  . JOINT REPLACEMENT    . KNEE ARTHROPLASTY Bilateral   . knee infection Right    knee hardware removed 11/12/   replaced 1/13  . TOTAL HIP ARTHROPLASTY Left 11/09/2017   Procedure:  LEFT TOTAL HIP ARTHROPLASTY;  Surgeon: Valeria Batman, MD;  Location: [facility] OR;  Service: Orthopedics;  Laterality: Left;  Marland Kitchen VENTRAL HERNIA REPAIR  07/21/2011   Procedure: LAPAROSCOPIC VENTRAL HERNIA;  Surgeon: Ernestene Mention, MD;  Location: [facility];  Service: General;  Laterality: N/A;  Laparoscopic Repair Verntal Incisional Hernia and Mesh   Social History   Social History Narrative   Lives  at home with her husband   They have second home in [place] where they spend a lot of time   Right handed   Caffeine: 2 cups daily   Immunization History  Administered Date(s) Administered  . Influenza Whole 11/18/2009  . Influenza, High Dose Seasonal PF 01/05/2017, 12/09/2017  . Influenza-Unspecified 11/29/2010, 01/18/2015, 11/30/2015  . PFIZER SARS-COV-2 Vaccination 04/22/2019, 05/17/2019  . Pneumococcal Polysaccharide-23 11/29/2007, 04/16/2008  . Tdap 04/21/2018  . Zoster Recombinat (Shingrix) 10/18/2017     Objective: Vital Signs: LMP 09/20/2000 (Exact Date)    Physical Exam   Musculoskeletal Exam: ***  CDAI Exam: CDAI Score: -- Patient Global: --; Provider Global: -- Swollen: --; Tender: -- Joint Exam 10/24/2019   No joint exam has been documented for this visit   There is currently no information documented on the homunculus. Go to the Rheumatology activity and complete the homunculus joint exam.  Investigation: No additional findings.  Imaging: No results found.  Recent Labs: Lab Results  Component Value Date   WBC 9.3 11/11/2017   HGB 9.6 (L) 11/11/2017   PLT 214 11/11/2017   NA 141 11/11/2017   K 3.6 11/11/2017   CL 106 11/11/2017   CO2 28 11/11/2017   GLUCOSE 122 (H) 11/11/2017   BUN 10 11/11/2017   CREATININE 0.73 11/11/2017   BILITOT 0.8 11/09/2017   ALKPHOS 57 11/09/2017   AST 21 11/09/2017   ALT 18 11/09/2017   PROT 6.7 11/09/2017   ALBUMIN 3.6 11/09/2017   CALCIUM 8.3 (L) 11/11/2017   GFRAA >60 11/11/2017    Speciality Comments: No specialty comments available.  Procedures:  No procedures performed Allergies: Sudafed [pseudoephedrine hcl]   Assessment / Plan:     Visit Diagnoses: No diagnosis found.  Orders: No orders of the defined types were placed in this encounter.  No orders of the defined types were placed in this encounter.   Face-to-face time spent with patient was *** minutes. Greater than 50% of time was spent in  counseling and coordination of care.  Follow-Up Instructions: No follow-ups on file.   Ellen Henri, CMA  Note - This record has been created using Animal nutritionist.  Chart creation errors have been sought, but may not always  have been located. Such creation errors do not reflect on  the standard of medical care.

## 2019-10-11 DIAGNOSIS — K219 Gastro-esophageal reflux disease without esophagitis: Secondary | ICD-10-CM | POA: Diagnosis not present

## 2019-10-11 DIAGNOSIS — Z1211 Encounter for screening for malignant neoplasm of colon: Secondary | ICD-10-CM | POA: Diagnosis not present

## 2019-10-11 DIAGNOSIS — R319 Hematuria, unspecified: Secondary | ICD-10-CM | POA: Diagnosis not present

## 2019-10-11 DIAGNOSIS — R1310 Dysphagia, unspecified: Secondary | ICD-10-CM | POA: Diagnosis not present

## 2019-10-12 ENCOUNTER — Other Ambulatory Visit: Payer: Self-pay | Admitting: Gastroenterology

## 2019-10-12 DIAGNOSIS — K449 Diaphragmatic hernia without obstruction or gangrene: Secondary | ICD-10-CM

## 2019-10-12 DIAGNOSIS — R1310 Dysphagia, unspecified: Secondary | ICD-10-CM

## 2019-10-23 ENCOUNTER — Ambulatory Visit
Admission: RE | Admit: 2019-10-23 | Discharge: 2019-10-23 | Disposition: A | Discharge status: Home / Self Care | Payer: Medicare PPO | Source: Ambulatory Visit | Attending: Gastroenterology | Admitting: Gastroenterology

## 2019-10-23 ENCOUNTER — Other Ambulatory Visit: Payer: Self-pay

## 2019-10-23 ENCOUNTER — Ambulatory Visit: Payer: Medicare PPO | Admitting: Rheumatology

## 2019-10-23 ENCOUNTER — Telehealth: Payer: Self-pay

## 2019-10-23 ENCOUNTER — Encounter: Payer: Self-pay | Admitting: Rheumatology

## 2019-10-23 VITALS — BP 126/84 | HR 86 | Resp 16 | Ht 65.0 in | Wt 228.0 lb

## 2019-10-23 DIAGNOSIS — M8589 Other specified disorders of bone density and structure, multiple sites: Secondary | ICD-10-CM

## 2019-10-23 DIAGNOSIS — Z8669 Personal history of other diseases of the nervous system and sense organs: Secondary | ICD-10-CM

## 2019-10-23 DIAGNOSIS — Z96653 Presence of artificial knee joint, bilateral: Secondary | ICD-10-CM

## 2019-10-23 DIAGNOSIS — F5101 Primary insomnia: Secondary | ICD-10-CM

## 2019-10-23 DIAGNOSIS — M19042 Primary osteoarthritis, left hand: Secondary | ICD-10-CM

## 2019-10-23 DIAGNOSIS — M7062 Trochanteric bursitis, left hip: Secondary | ICD-10-CM

## 2019-10-23 DIAGNOSIS — K219 Gastro-esophageal reflux disease without esophagitis: Secondary | ICD-10-CM | POA: Diagnosis not present

## 2019-10-23 DIAGNOSIS — Z8679 Personal history of other diseases of the circulatory system: Secondary | ICD-10-CM | POA: Diagnosis not present

## 2019-10-23 DIAGNOSIS — R1310 Dysphagia, unspecified: Secondary | ICD-10-CM

## 2019-10-23 DIAGNOSIS — M7061 Trochanteric bursitis, right hip: Secondary | ICD-10-CM | POA: Diagnosis not present

## 2019-10-23 DIAGNOSIS — Z5181 Encounter for therapeutic drug level monitoring: Secondary | ICD-10-CM

## 2019-10-23 DIAGNOSIS — M797 Fibromyalgia: Secondary | ICD-10-CM | POA: Diagnosis not present

## 2019-10-23 DIAGNOSIS — Z8719 Personal history of other diseases of the digestive system: Secondary | ICD-10-CM

## 2019-10-23 DIAGNOSIS — M19041 Primary osteoarthritis, right hand: Secondary | ICD-10-CM | POA: Diagnosis not present

## 2019-10-23 DIAGNOSIS — Z96642 Presence of left artificial hip joint: Secondary | ICD-10-CM

## 2019-10-23 NOTE — Telephone Encounter (Signed)
Narcotic agreement was not completely filled out during visit today. I have mailed a new one to patient's home address to be completed (front and back). I attempted to contact the patient and left message on machine to advise patient to complete the form and mail back to the office.

## 2019-10-23 NOTE — Progress Notes (Signed)
Office Visit Note  Patient: Miranda Romero             Date of Birth: 28-Nov-1945           MRN: [id number]             PCP: Gaspar Garbe, MD Referring: Gaspar Garbe, MD Visit Date: 10/23/2019 Occupation: @GUAROCC @  Subjective:  Generalized pain.   History of Present Illness: F Metzner is a 74 y.o. female with history of fibromyalgia and osteoarthritis.  She states she has been experiencing pain in all of her joints and muscles.  She has been experiencing fatigue as well.  She states she has not been very active during the Covid times and it has been very hard for her.  She is losing balance easily.  She states she has talked to her son who is a physical therapist and has done research and fibromyalgia he has been trying to teach her some exercises to strengthen.  She continues to have a lot of discomfort in her hip joints and knee joints.  Her left trochanteric bursa has been painful.  Activities of Daily Living:  Patient reports morning stiffness for 24 hours.   Patient Reports nocturnal pain.  Difficulty dressing/grooming: Denies Difficulty climbing stairs: Reports Difficulty getting out of chair: Denies Difficulty using hands for taps, buttons, cutlery, and/or writing: Denies  Review of Systems  Constitutional: Negative for fatigue.  HENT: Negative for mouth sores, mouth dryness and nose dryness.   Eyes: Negative for itching and dryness.  Respiratory: Negative for shortness of breath and difficulty breathing.   Cardiovascular: Negative for chest pain and palpitations.  Gastrointestinal: Negative for blood in stool, constipation and diarrhea.  Endocrine: Negative for increased urination.  Genitourinary: Negative for difficulty urinating.  Musculoskeletal: Positive for arthralgias, joint pain, myalgias, morning stiffness, muscle tenderness and myalgias. Negative for joint swelling.  Skin: Negative for color change, rash and redness.    Allergic/Immunologic: Negative for susceptible to infections.  Neurological: Positive for dizziness and weakness. Negative for numbness, headaches and memory loss.  Hematological: Positive for bruising/bleeding tendency.  Psychiatric/Behavioral: Negative for confusion.    PMFS History:  Patient Active Problem List   Diagnosis Date Noted  . Osteoarthritis of left hip 11/09/2017  . S/P hip replacement, left 11/09/2017  . Primary osteoarthritis of both hips 09/25/2016  . Fibromyalgia 04/28/2016  . Primary osteoarthritis of both knees 04/28/2016  . History of total knee replacement, bilateral 04/28/2016  . Primary osteoarthritis of both hands 04/28/2016  . Primary insomnia 04/28/2016  . History of sleep apnea 04/28/2016  . History of restless legs syndrome 04/28/2016  . Osteopenia of multiple sites 04/28/2016  . Trochanteric bursitis of left hip 02/03/2016  . Hiatal hernia with gastroesophageal reflux 11/06/2015  . Hiatal hernia 03/25/2015  . Gastroesophageal reflux disease with esophagitis 01/17/2015  . Ligamentous laxity of knee 01/05/2011  . Knee effusion, right 11/10/2010  . Knee pain 11/10/2010  . Incisional hernia 10/16/2010  . HYPERTENSION 05/14/2010  . OBSTRUCTIVE SLEEP APNEA 05/14/2010  . Obesity 04/08/2009    Past Medical History:  Diagnosis Date  . Arthritis   . Blood transfusion   . Fibromyalgia    Dr 04/10/2009  . GERD (gastroesophageal reflux disease)   . Hiatal hernia with gastroesophageal reflux 11/06/2015  . History of hiatal hernia   . Hyperlipidemia   . Hypertension    Dr 11/08/2015 LOV 4/13 on chart  . Seasonal allergies   . Sleep apnea  SEVERE  per sleep study 3/12 EPIC no CPAP   . Transfusion history    3 yrs ago s/p surgery for knee  . Umbilical hernia   . Weight decrease     Family History  Problem Relation Age of Onset  . Lymphoma Mother    Past Surgical History:  Procedure Laterality Date  . ANKLE SURGERY    . APPENDECTOMY  2014  .  BALLOON DILATION N/A 06/05/2015   Procedure: BALLOON DILATION;  Surgeon: Willis ModenaWilliam Outlaw, MD;  Location: [facility];  Service: Endoscopy;  Laterality: N/A;  . CATARACT EXTRACTION Bilateral 05/2013  . CHOLECYSTECTOMY    . DILATION AND CURETTAGE OF UTERUS    . ESOPHAGOGASTRODUODENOSCOPY (EGD) WITH PROPOFOL N/A 06/05/2015   Procedure: ESOPHAGOGASTRODUODENOSCOPY (EGD) WITH PROPOFOL;  Surgeon: Willis ModenaWilliam Outlaw, MD;  Location: [facility];  Service: Endoscopy;  Laterality: N/A;  . HERNIA REPAIR  05/2013   due to appendectomy with mesh  . HIATAL HERNIA REPAIR N/A 11/06/2015   Procedure: LAPAROSCOPIC REPAIR HIATAL HERNIA, Nissen FUNDOPLICATION;  Surgeon: Claud KelpHaywood Ingram, MD;  Location: [facility];  Service: General;  Laterality: N/A;  . JOINT REPLACEMENT    . KNEE ARTHROPLASTY Bilateral   . knee infection Right    knee hardware removed 11/12/   replaced 1/13  . TOTAL HIP ARTHROPLASTY Left 11/09/2017   Procedure: LEFT TOTAL HIP ARTHROPLASTY;  Surgeon: Valeria BatmanWhitfield, Peter W, MD;  Location: [facility];  Service: Orthopedics;  Laterality: Left;  Marland Kitchen. VENTRAL HERNIA REPAIR  07/21/2011   Procedure: LAPAROSCOPIC VENTRAL HERNIA;  Surgeon: Ernestene MentionHaywood M Ingram, MD;  Location: [facility];  Service: General;  Laterality: N/A;  Laparoscopic Repair Verntal Incisional Hernia and Mesh   Social History   Social History Narrative   Lives at home with her husband   [place] have second home in [place] where they spend a lot of time   Right handed   Caffeine: 2 cups daily   Immunization History  Administered Date(s) Administered  . Influenza Whole 11/18/2009  . Influenza, High Dose Seasonal PF 01/05/2017, 12/09/2017  . Influenza-Unspecified 11/29/2010, 01/18/2015, 11/30/2015  . PFIZER SARS-COV-2 Vaccination 04/22/2019, 05/17/2019  . Pneumococcal Polysaccharide-23 11/29/2007, 04/16/2008  . Tdap 04/21/2018  . Zoster Recombinat (Shingrix) 10/18/2017     Objective: Vital Signs: BP 126/84 (BP Location: Left Arm, Patient Position: Sitting, Cuff  Size: Normal)   Pulse 86   Resp 16   Ht 5\' 5"  (1.651 m)   Wt 228 lb (103.4 kg)   LMP 09/20/2000 (Exact Date)   BMI 37.94 kg/m    Physical Exam Vitals and nursing note reviewed.  Constitutional:      Appearance: She is well-developed.  HENT:     Head: Normocephalic and atraumatic.  Eyes:     Conjunctiva/sclera: Conjunctivae normal.  Cardiovascular:     Rate and Rhythm: Normal rate and regular rhythm.     Heart sounds: Normal heart sounds.  Pulmonary:     Effort: Pulmonary effort is normal.     Breath sounds: Normal breath sounds.  Abdominal:     General: Bowel sounds are normal.     Palpations: Abdomen is soft.  Musculoskeletal:     Cervical back: Normal range of motion.  Lymphadenopathy:     Cervical: No cervical adenopathy.  Skin:    General: Skin is warm and dry.     Capillary Refill: Capillary refill takes less than 2 seconds.  Neurological:     Mental Status: She is alert and oriented to person, place, and time.  Psychiatric:  Behavior: Behavior normal.      Musculoskeletal Exam: She has good range of motion of her cervical spine.  Shoulder joints, elbow joints, wrist joints, MCPs with good range of motion.  She has mild PIP and DIP thickening.  She had tenderness over left trochanteric bursa.  Her left hip was replaced.  Her bilateral knee joints are replaced.  No warmth swelling or effusion was noted.  She has no tenderness over MTPs.  CDAI Exam: CDAI Score: -- Patient Global: --; Provider Global: -- Swollen: --; Tender: -- Joint Exam 10/23/2019   No joint exam has been documented for this visit   There is currently no information documented on the homunculus. Go to the Rheumatology activity and complete the homunculus joint exam.  Investigation: No additional findings.  Imaging: DG UGI W DOUBLE CM (HD BA)  Result Date: 10/23/2019 CLINICAL DATA:  Gastroesophageal reflux. Dysphagia. Previous Nissen fundoplication for hiatal hernia. EXAM: UPPER GI  SERIES WITH KUB TECHNIQUE: After obtaining a scout radiograph a routine upper GI series was performed using thin and high density barium. FLUOROSCOPY TIME:  Fluoroscopy Time:  2 minutes 36 seconds Radiation Exposure Index (if provided by the fluoroscopic device): 496 mGy Number of Acquired Spot Images: 0 COMPARISON:  None. FINDINGS: Scout radiograph:  Unremarkable bowel gas pattern. Esophagus: No evidence of esophageal mass or stricture. Moderate esophageal dysmotility is seen with break up of primary peristalsis causing esophageal stasis. Mild gastroesophageal reflux was seen to the level of the distal thoracic esophagus. An ingested 13 mm barium tablet passed freely through the esophagus and into the stomach. Stomach: A small sliding hiatal hernia is seen. Remainder of the stomach is normal in appearance. No evidence of gastric masses or ulcers. Duodenum: No ulcer or other significant abnormality seen involving duodenal bulb or sweep. Other:  None. IMPRESSION: Small sliding hiatal hernia. Mild gastroesophageal reflux.  No evidence of esophageal stricture. Moderate esophageal dysmotility. Electronically Signed   By: Danae Orleans M.D.   On: 10/23/2019 08:46    Recent Labs: Lab Results  Component Value Date   WBC 9.3 11/11/2017   HGB 9.6 (L) 11/11/2017   PLT 214 11/11/2017   NA 141 11/11/2017   K 3.6 11/11/2017   CL 106 11/11/2017   CO2 28 11/11/2017   GLUCOSE 122 (H) 11/11/2017   BUN 10 11/11/2017   CREATININE 0.73 11/11/2017   BILITOT 0.8 11/09/2017   ALKPHOS 57 11/09/2017   AST 21 11/09/2017   ALT 18 11/09/2017   PROT 6.7 11/09/2017   ALBUMIN 3.6 11/09/2017   CALCIUM 8.3 (L) 11/11/2017   GFRAA >60 11/11/2017   02/21 CBC CMP, Vit D WNLs  Speciality Comments: No specialty comments available.  Procedures:  No procedures performed Allergies: Sudafed [pseudoephedrine hcl]   Assessment / Plan:     Visit Diagnoses: Fibromyalgia -she has been having flare of fibromyalgia with increased  pain and discomfort.  She has multiple tender joints and hyperalgesia.  She states she takes tramadol only occasionally.  She is going to [place] in October.  Need for regular exercise was emphasized.  Medication monitoring encounter - tramadol 50 mg 1 tablet twice daily as needed.  According to patient she only takes it occasionally.  Then she would not need any more tramadol for the next 6 months.  She declined UDS as she does not need any refills.  Trochanteric bursitis of both hips-she continues to have some trochanteric bursitis discomfort.  Primary osteoarthritis of both hands-joint protection muscle strengthening was discussed.  Status post total hip replacement, left-chronic pain.  History of total knee replacement, bilateral-she continues to have discomfort in her knee joints.  Weight loss diet and exercise was emphasized.  Muscle strength exercises were demonstrated in the office.  Primary insomnia-sleep hygiene was discussed.  Osteopenia of multiple sites - DEXA 04/18/18 BMD 0.809 with T-score -1.1. She is taking a calcium and vitamin D supplement.  She will be getting another bone density this year.  History of hypertension  History of sleep apnea  History of restless legs syndrome  History of gastroesophageal reflux (GERD)  Orders: No orders of the defined types were placed in this encounter.  No orders of the defined types were placed in this encounter.   Face-to-face time spent with patient was 45  minutes. Greater than 50% of time was spent in counseling and coordination of care.  Follow-Up Instructions: Return in about 6 months (around 04/24/2020) for Osteoarthritis,FMS.   Pollyann Savoy, MD  Note - This record has been created using Animal nutritionist.  Chart creation errors have been sought, but may not always  have been located. Such creation errors do not reflect on  the standard of medical care.

## 2019-10-23 NOTE — Patient Instructions (Signed)
COVID-19 vaccine recommendations:   COVID-19 vaccine is recommended for everyone (unless you are allergic to a vaccine component), even if you are on a medication that suppresses your immune system.   Do not take Tylenol or ant anti-inflammatory medications (NSAIDs) 24 hours prior to the COVID-19 vaccination.   There is no direct evidence about the efficacy of the COVID-19 vaccine in individuals who are on medications that suppress the immune system.   Even if you are fully vaccinated, and you are on any medications that suppress your immune system, please continue to wear a mask, maintain at least six feet social distance and practice hand hygiene.   If you develop a COVID-19 infection, please contact your PCP or our office to determine if you need antibody infusion.  We anticipate that a booster vaccine will be available soon for immunosuppressed individuals. Please cal our office before receiving your booster dose to make adjustments to your medication regimen.  

## 2019-10-24 ENCOUNTER — Ambulatory Visit: Payer: Medicare PPO | Admitting: Rheumatology

## 2019-11-06 DIAGNOSIS — R809 Proteinuria, unspecified: Secondary | ICD-10-CM | POA: Diagnosis not present

## 2019-11-06 DIAGNOSIS — D692 Other nonthrombocytopenic purpura: Secondary | ICD-10-CM | POA: Diagnosis not present

## 2019-11-06 DIAGNOSIS — Z6836 Body mass index (BMI) 36.0-36.9, adult: Secondary | ICD-10-CM | POA: Diagnosis not present

## 2019-11-06 DIAGNOSIS — N182 Chronic kidney disease, stage 2 (mild): Secondary | ICD-10-CM | POA: Diagnosis not present

## 2019-11-06 DIAGNOSIS — M797 Fibromyalgia: Secondary | ICD-10-CM | POA: Diagnosis not present

## 2019-11-06 DIAGNOSIS — R7301 Impaired fasting glucose: Secondary | ICD-10-CM | POA: Diagnosis not present

## 2019-11-06 DIAGNOSIS — E7800 Pure hypercholesterolemia, unspecified: Secondary | ICD-10-CM | POA: Diagnosis not present

## 2019-11-06 DIAGNOSIS — I129 Hypertensive chronic kidney disease with stage 1 through stage 4 chronic kidney disease, or unspecified chronic kidney disease: Secondary | ICD-10-CM | POA: Diagnosis not present

## 2019-12-11 ENCOUNTER — Telehealth: Payer: Self-pay

## 2019-12-11 MED ORDER — DULOXETINE HCL 30 MG PO CPEP: 30.0000 mg | ORAL_CAPSULE | Freq: Every day | ORAL | 0 refills | Status: DC

## 2019-12-11 NOTE — Telephone Encounter (Signed)
Last Visit: 10/23/2019 Next Visit: 04/26/2019  DX: Fibromyalgia  Okay to refill Cymbalta per Dr. Corliss Skains.

## 2019-12-11 NOTE — Telephone Encounter (Signed)
Patient called requesting prescription refill of Duloxetine to be sent to [facility].

## 2019-12-13 ENCOUNTER — Telehealth: Payer: Self-pay

## 2019-12-13 MED ORDER — DULOXETINE HCL 30 MG PO CPEP: 30.0000 mg | ORAL_CAPSULE | Freq: Every day | ORAL | 0 refills | Status: DC

## 2019-12-13 NOTE — Telephone Encounter (Signed)
Patient advised her prescription has been resent to [facility].

## 2019-12-13 NOTE — Telephone Encounter (Signed)
Patient left a voicemail stating she called earlier in the week for a prescription refill of Duloxetine to be sent to [facility].  Patient states it went to [facility] in [place].  Patient requested a return call.

## 2019-12-27 DIAGNOSIS — Z23 Encounter for immunization: Secondary | ICD-10-CM | POA: Diagnosis not present

## 2020-02-12 ENCOUNTER — Other Ambulatory Visit: Payer: Self-pay | Admitting: Rheumatology

## 2020-02-12 NOTE — Telephone Encounter (Signed)
Last Visit: 10/23/2019 Next Visit: 04/25/2020  Okay to refill per Dr. Corliss Skains

## 2020-02-15 DIAGNOSIS — M79675 Pain in left toe(s): Secondary | ICD-10-CM | POA: Diagnosis not present

## 2020-02-15 DIAGNOSIS — B351 Tinea unguium: Secondary | ICD-10-CM | POA: Diagnosis not present

## 2020-02-15 DIAGNOSIS — R262 Difficulty in walking, not elsewhere classified: Secondary | ICD-10-CM | POA: Diagnosis not present

## 2020-02-15 DIAGNOSIS — M79674 Pain in right toe(s): Secondary | ICD-10-CM | POA: Diagnosis not present

## 2020-03-18 ENCOUNTER — Other Ambulatory Visit: Payer: Self-pay | Admitting: Rheumatology

## 2020-03-18 NOTE — Telephone Encounter (Signed)
Last Visit: 10/23/2019 Next Visit: 04/25/2020  Okay to refill per Dr. Corliss Skains

## 2020-03-28 DIAGNOSIS — M25541 Pain in joints of right hand: Secondary | ICD-10-CM | POA: Diagnosis not present

## 2020-03-28 DIAGNOSIS — M797 Fibromyalgia: Secondary | ICD-10-CM | POA: Diagnosis not present

## 2020-03-28 DIAGNOSIS — M25542 Pain in joints of left hand: Secondary | ICD-10-CM | POA: Diagnosis not present

## 2020-03-28 DIAGNOSIS — Z79899 Other long term (current) drug therapy: Secondary | ICD-10-CM | POA: Diagnosis not present

## 2020-04-08 NOTE — Progress Notes (Signed)
Office Visit Note  Patient: Miranda Romero             Date of Birth: [birth year]           MRN: [id number]             PCP: Haywood Pao, MD Referring: Haywood Pao, MD Visit Date: 04/09/2020 Occupation: '@GUAROCC' @  Subjective:  Other (Increased pain )   History of Present Illness: Miranda Romero is a 75 y.o. female with history of fibromyalgia and osteoarthritis.  She states about 3 months ago she started having pain in her bilateral arms and her hands.  Symptoms have stayed the same.  She was seen by her PCP about 3 weeks ago and had lab work.  She states she was given prednisone as her sedimentation rate was elevated at 39.  She took 5 days of prednisone and felt better but then the symptoms came back.  She continues to have some discomfort in her arms and her hands.  Recently she is having discomfort in her feet as well.  She has no difficulty raising her arms and no difficulty getting up from the chair.  She continues to have some discomfort from fibromyalgia as well.  She has discomfort in her knee joints due to osteoarthritis.  She was exposed to Covid and December but did not get the infection.  Activities of Daily Living:  Patient reports morning stiffness for 2-3 hours.   Patient Reports nocturnal pain.  Difficulty dressing/grooming: Reports Difficulty climbing stairs: Reports Difficulty getting out of chair: Denies Difficulty using hands for taps, buttons, cutlery, and/or writing: Reports  Review of Systems  Constitutional: Negative for fatigue.  HENT: Negative for mouth sores, mouth dryness and nose dryness.   Eyes: Negative for pain, itching and dryness.  Respiratory: Negative for shortness of breath and difficulty breathing.   Cardiovascular: Negative for chest pain and palpitations.  Gastrointestinal: Negative for blood in stool, constipation and diarrhea.  Endocrine: Negative for increased urination.  Genitourinary: Negative for difficulty  urinating.  Musculoskeletal: Positive for arthralgias, joint pain, myalgias, morning stiffness, muscle tenderness and myalgias. Negative for joint swelling.  Skin: Negative for color change, rash and redness.  Allergic/Immunologic: Negative for susceptible to infections.  Neurological: Positive for numbness and headaches. Negative for dizziness, memory loss and weakness.  Hematological: Positive for bruising/bleeding tendency.  Psychiatric/Behavioral: Negative for confusion.    PMFS History:  Patient Active Problem List   Diagnosis Date Noted  . Osteoarthritis of left hip 11/09/2017  . S/P hip replacement, left 11/09/2017  . Primary osteoarthritis of both hips 09/25/2016  . Fibromyalgia 04/28/2016  . Primary osteoarthritis of both knees 04/28/2016  . History of total knee replacement, bilateral 04/28/2016  . Primary osteoarthritis of both hands 04/28/2016  . Primary insomnia 04/28/2016  . History of sleep apnea 04/28/2016  . History of restless legs syndrome 04/28/2016  . Osteopenia of multiple sites 04/28/2016  . Trochanteric bursitis of left hip 02/03/2016  . Hiatal hernia with gastroesophageal reflux 11/06/2015  . Hiatal hernia 03/25/2015  . Gastroesophageal reflux disease with esophagitis 01/17/2015  . Ligamentous laxity of knee 01/05/2011  . Knee effusion, right 11/10/2010  . Knee pain 11/10/2010  . Incisional hernia 10/16/2010  . HYPERTENSION 05/14/2010  . OBSTRUCTIVE SLEEP APNEA 05/14/2010  . Obesity 04/08/2009    Past Medical History:  Diagnosis Date  . Arthritis   . Blood transfusion   . Fibromyalgia    Dr Estanislado Pandy  . GERD (gastroesophageal reflux  disease)   . Hiatal hernia with gastroesophageal reflux 11/06/2015  . History of hiatal hernia   . Hyperlipidemia   . Hypertension    Dr Osborne Casco LOV 4/13 on chart  . Seasonal allergies   . Sleep apnea    SEVERE  per sleep study 3/12 EPIC no CPAP   . Transfusion history    3 yrs ago s/p surgery for knee  .  Umbilical hernia   . Weight decrease     Family History  Problem Relation Age of Onset  . Lymphoma Mother    Past Surgical History:  Procedure Laterality Date  . ANKLE SURGERY    . APPENDECTOMY  2014  . BALLOON DILATION N/A 06/05/2015   Procedure: BALLOON DILATION;  Surgeon: Arta Silence, MD;  Location: [facility];  Service: Endoscopy;  Laterality: N/A;  . CATARACT EXTRACTION Bilateral 05/2013  . CHOLECYSTECTOMY    . DILATION AND CURETTAGE OF UTERUS    . ESOPHAGOGASTRODUODENOSCOPY (EGD) WITH PROPOFOL N/A 06/05/2015   Procedure: ESOPHAGOGASTRODUODENOSCOPY (EGD) WITH PROPOFOL;  Surgeon: Arta Silence, MD;  Location: [facility];  Service: Endoscopy;  Laterality: N/A;  . HERNIA REPAIR  05/2013   due to appendectomy with mesh  . HIATAL HERNIA REPAIR N/A 11/06/2015   Procedure: LAPAROSCOPIC REPAIR HIATAL HERNIA, [place] FUNDOPLICATION;  Surgeon: Fanny Skates, MD;  Location: [facility];  Service: General;  Laterality: N/A;  . JOINT REPLACEMENT    . KNEE ARTHROPLASTY Bilateral   . knee infection Right    knee hardware removed 11/12/   replaced 1/13  . TOTAL HIP ARTHROPLASTY Left 11/09/2017   Procedure: LEFT TOTAL HIP ARTHROPLASTY;  Surgeon: Garald Balding, MD;  Location: [facility];  Service: Orthopedics;  Laterality: Left;  Marland Kitchen VENTRAL HERNIA REPAIR  07/21/2011   Procedure: LAPAROSCOPIC VENTRAL HERNIA;  Surgeon: Adin Hector, MD;  Location: [facility];  Service: General;  Laterality: N/A;  Laparoscopic Repair Verntal Incisional Hernia and Mesh   Social History   Social History Narrative   Lives at home with her husband   They have second home in [place] where they spend a lot of time   Right handed   Caffeine: 2 cups daily   Immunization History  Administered Date(s) Administered  . Influenza Whole 11/18/2009  . Influenza, High Dose Seasonal PF 01/05/2017, 12/09/2017  . Influenza-Unspecified 11/29/2010, 01/18/2015, 11/30/2015  . PFIZER(Purple Top)SARS-COV-2 Vaccination 04/22/2019,  05/17/2019, 11/18/2019  . Pneumococcal Polysaccharide-23 11/29/2007, 04/16/2008  . Tdap 04/21/2018  . Zoster Recombinat (Shingrix) 10/18/2017     Objective: Vital Signs: BP 138/83 (BP Location: Left Wrist, Patient Position: Sitting, Cuff Size: Normal)   Pulse 97   Resp 16   Ht '5\' 6"'  (1.676 m)   Wt 220 lb (99.8 kg)   LMP 09/20/2000 (Exact Date)   BMI 35.51 kg/m    Physical Exam Vitals and nursing note reviewed.  Constitutional:      Appearance: She is well-developed and well-nourished.  HENT:     Head: Normocephalic and atraumatic.  Eyes:     Extraocular Movements: EOM normal.     Conjunctiva/sclera: Conjunctivae normal.  Cardiovascular:     Rate and Rhythm: Normal rate and regular rhythm.     Pulses: Intact distal pulses.     Heart sounds: Normal heart sounds.  Pulmonary:     Effort: Pulmonary effort is normal.     Breath sounds: Normal breath sounds.  Abdominal:     General: Bowel sounds are normal.     Palpations: Abdomen is soft.  Musculoskeletal:  Cervical back: Normal range of motion.  Lymphadenopathy:     Cervical: No cervical adenopathy.  Skin:    General: Skin is warm and dry.     Capillary Refill: Capillary refill takes less than 2 seconds.  Neurological:     Mental Status: She is alert and oriented to person, place, and time.  Psychiatric:        Mood and Affect: Mood and affect normal.        Behavior: Behavior normal.      Musculoskeletal Exam: C-spine was in good range of motion.  She difficulty raising her arm and had tenderness over her arm muscles.  Elbow joints, wrist joints, MCPs PIPs and DIPs with good range of motion with no synovitis.  She had some difficulty getting up from chair due to knee joint discomfort.  She had difficulty with mobility due to hip and knee joint pain.  There was no tenderness over ankles or MTPs.  CDAI Exam: CDAI Score: -- Patient Global: --; Provider Global: -- Swollen: --; Tender: -- Joint Exam 04/09/2020   No  joint exam has been documented for this visit   There is currently no information documented on the homunculus. Go to the Rheumatology activity and complete the homunculus joint exam.  Investigation: No additional findings.  Imaging: No results found.  Recent Labs: Lab Results  Component Value Date   WBC 9.3 11/11/2017   HGB 9.6 (L) 11/11/2017   PLT 214 11/11/2017   NA 141 11/11/2017   K 3.6 11/11/2017   CL 106 11/11/2017   CO2 28 11/11/2017   GLUCOSE 122 (H) 11/11/2017   BUN 10 11/11/2017   CREATININE 0.73 11/11/2017   BILITOT 0.8 11/09/2017   ALKPHOS 57 11/09/2017   AST 21 11/09/2017   ALT 18 11/09/2017   PROT 6.7 11/09/2017   ALBUMIN 3.6 11/09/2017   CALCIUM 8.3 (L) 11/11/2017   GFRAA >60 11/11/2017   03/28/2020 ESR 39, CMP normal, CBC normal, TSH normal, cortisol normal, hemoglobin A1c 5.4, C-reactive protein 4  Speciality Comments: No specialty comments available.  Procedures:  No procedures performed Allergies: Sudafed [pseudoephedrine hcl]   Assessment / Plan:     Visit Diagnoses: Polymyalgia rheumatica (HCC)-patient has proximal muscle weakness and tenderness.  She has difficulty raising her arm.  Her sed rate was 39 Dr. Loren Racer office.  She had good response to prednisone and the symptoms recurred.  Detailed cause regarding polymyalgia rheumatica was provided.  She does not have any inflammatory arthritis but she complains of discomfort in her hands.  She is hesitant to go on long-term prednisone.  We had detailed discussion regarding the treatment.  Reviewed indications side effects contraindications of prednisone.  She will be starting on prednisone 10 mg p.o. daily for a month and if her sed rate is normal after a month then taper prednisone by 1 mg every month.  I may add methotrexate at the follow-up visit.  I have advised her to monitor blood pressure and blood glucose closely and follow-up with Dr. Osborne Casco.  She may have to go on a bisphosphonate as she has  history of osteopenia.  High risk medication use-I discussed possible use of low-dose methotrexate which may help [place] with prednisone taper.  I will obtain some labs in anticipation to start her on methotrexate.  Trochanteric bursitis of both hips-she continues to have pain and discomfort.  Primary osteoarthritis of both hands-she has PIP and DIP thickening but no synovitis was noted.  Pain in both hands-she  complains of increased pain and discomfort in her hands.  I do not see any synovitis.  I will obtain labs.  Status post total hip replacement, left-she has chronic pain.  History of total knee replacement, bilateral-she has discomfort in her bilateral knee joints and uses a cane.  Fibromyalgia -she continues to have some generalized pain from fibromyalgia.  cymbalta 61m by mouth at bedtime.   Medication monitoring encounter - tramadol 50 mg 1 tablet twice daily as needed.  UDS:11/04/2018 Narcotic agreement: 11/02/2019.  We will obtain UDS again and get narcotic agreement renewed.  Primary insomnia-she has intermittent subcutaneous needed.  Osteopenia of multiple sites - DEXA 04/18/18 BMD 0.809 with T-score -1.1.  History of sleep apnea  History of hypertension-blood pressure is normal today.  History of restless legs syndrome  History of gastroesophageal reflux (GERD)  Orders: Orders Placed This Encounter  Procedures  . DRUG MONITOR, TRAMADOL,QN, URINE  . DRUG MONITOR, PANEL 5, W/CONF, URINE  . CK  . VITAMIN D 25 Hydroxy (Vit-D Deficiency, Fractures)  . Rheumatoid factor  . Cyclic citrul peptide antibody, IgG  . Hepatitis B core antibody, IgM  . Hepatitis B surface antigen  . Hepatitis C antibody  . HIV Antibody (routine testing w rflx)  . QuantiFERON-TB Gold Plus  . Serum protein electrophoresis with reflex  . IgG, IgA, IgM   Meds ordered this encounter  Medications  . predniSONE (DELTASONE) 10 MG tablet    Sig: Take 1 tablet (10 mg total) by mouth daily with  breakfast.    Dispense:  30 tablet    Refill:  0     Follow-Up Instructions: Return in about 4 weeks (around 05/07/2020) for Osteoarthritis,FMS myalgia.   SBo Merino MD  Note - This record has been created using DEditor, commissioning  Chart creation errors have been sought, but may not always  have been located. Such creation errors do not reflect on  the standard of medical care.

## 2020-04-09 ENCOUNTER — Encounter: Payer: Self-pay | Admitting: Rheumatology

## 2020-04-09 ENCOUNTER — Other Ambulatory Visit: Payer: Self-pay

## 2020-04-09 ENCOUNTER — Ambulatory Visit: Payer: Medicare PPO | Admitting: Rheumatology

## 2020-04-09 VITALS — BP 138/83 | HR 97 | Resp 16 | Ht 66.0 in | Wt 220.0 lb

## 2020-04-09 DIAGNOSIS — Z96653 Presence of artificial knee joint, bilateral: Secondary | ICD-10-CM | POA: Diagnosis not present

## 2020-04-09 DIAGNOSIS — Z79899 Other long term (current) drug therapy: Secondary | ICD-10-CM

## 2020-04-09 DIAGNOSIS — M79642 Pain in left hand: Secondary | ICD-10-CM | POA: Diagnosis not present

## 2020-04-09 DIAGNOSIS — M19041 Primary osteoarthritis, right hand: Secondary | ICD-10-CM | POA: Diagnosis not present

## 2020-04-09 DIAGNOSIS — M79641 Pain in right hand: Secondary | ICD-10-CM | POA: Diagnosis not present

## 2020-04-09 DIAGNOSIS — Z8719 Personal history of other diseases of the digestive system: Secondary | ICD-10-CM

## 2020-04-09 DIAGNOSIS — Z96642 Presence of left artificial hip joint: Secondary | ICD-10-CM

## 2020-04-09 DIAGNOSIS — Z5181 Encounter for therapeutic drug level monitoring: Secondary | ICD-10-CM | POA: Diagnosis not present

## 2020-04-09 DIAGNOSIS — M8589 Other specified disorders of bone density and structure, multiple sites: Secondary | ICD-10-CM | POA: Diagnosis not present

## 2020-04-09 DIAGNOSIS — Z8669 Personal history of other diseases of the nervous system and sense organs: Secondary | ICD-10-CM

## 2020-04-09 DIAGNOSIS — M797 Fibromyalgia: Secondary | ICD-10-CM | POA: Diagnosis not present

## 2020-04-09 DIAGNOSIS — F5101 Primary insomnia: Secondary | ICD-10-CM | POA: Diagnosis not present

## 2020-04-09 DIAGNOSIS — M7061 Trochanteric bursitis, right hip: Secondary | ICD-10-CM

## 2020-04-09 DIAGNOSIS — M19042 Primary osteoarthritis, left hand: Secondary | ICD-10-CM

## 2020-04-09 DIAGNOSIS — M353 Polymyalgia rheumatica: Secondary | ICD-10-CM | POA: Diagnosis not present

## 2020-04-09 DIAGNOSIS — Z8679 Personal history of other diseases of the circulatory system: Secondary | ICD-10-CM

## 2020-04-09 DIAGNOSIS — M7062 Trochanteric bursitis, left hip: Secondary | ICD-10-CM

## 2020-04-09 MED ORDER — PREDNISONE 10 MG PO TABS: 10.0000 mg | ORAL_TABLET | Freq: Every day | ORAL | 0 refills | Status: DC

## 2020-04-09 NOTE — Patient Instructions (Addendum)
Take prednisone 10mg  (1 tablet) by mouth daily.    Polymyalgia Rheumatica Polymyalgia rheumatica (PMR) is an inflammatory disorder that causes the muscles and joints to ache and become stiff. Sometimes, PMR leads to a more dangerous condition that can cause vision loss (temporal arteritis or giant cell arteritis). What are the causes? The exact cause of PMR is not known. What increases the risk? You are more likely to develop this condition if you are:  Female.  37 years of age or older.  Caucasian. What are the signs or symptoms? Pain and stiffness are the main symptoms of PMR. Symptoms may:  Be worse after inactivity and in the morning.  Affect your: ? Hips, buttocks, and thighs. ? Neck, arms, and shoulders. This can make it hard to raise your arms above your head. ? Hands and wrists. Other symptoms include:  Fever.  Tiredness.  Weakness.  Depression.  Decreased appetite. This may lead to weight loss. Symptoms may start slowly or suddenly. How is this diagnosed? This condition is diagnosed with your medical history and a physical exam. You may need to see a health care provider who specializes in diseases of the joints, muscles, and bones (rheumatologist). You may also have tests, including:  Blood tests.  X-rays.  Ultrasound.   How is this treated? PMR usually goes away without treatment, but it may take years. Your health care provider may recommend low-dose steroids and other medicines to help manage your symptoms of pain and stiffness. Regular exercise and rest will also help your symptoms. Follow these instructions at home:  Take over-the-counter and prescription medicines only as told by your health care provider.  Make sure to get enough rest and sleep.  Eat a healthy and nutritious diet.  Try to exercise most days of the week. Ask your health care provider what type of exercise is best for you.  Keep all follow-up visits as told by your health care  provider. This is important.   Contact a health care provider if:  Your symptoms do not improve with medicine.  You have side effects from steroids. These may include: ? Weight gain. ? Swelling. ? Insomnia. ? Mood changes. ? Bruising. ? High blood sugar readings, if you have diabetes. ? Higher than normal blood pressure readings, if you monitor your blood pressure. Get help right away if:  You develop symptoms of temporal arteritis, such as: ? A change in vision. ? Severe headache. ? Scalp pain. ? Jaw pain. Summary  Polymyalgia rheumatica is an inflammatory disorder that causes aching and stiffness in your muscles and joints.  The exact cause of this condition is not known.  This condition usually goes away without treatment. Your health care provider may give you low-dose steroids to help manage your pain and stiffness.  Rest and regular exercise will help the symptoms. This information is not intended to replace advice given to you by your health care provider. Make sure you discuss any questions you have with your health care provider. Document Revised: 01/06/2018 Document Reviewed: 01/06/2018 Elsevier Patient Education  2021 2022.

## 2020-04-11 LAB — PROTEIN ELECTROPHORESIS, SERUM, WITH REFLEX
Albumin ELP: 4 g/dL (ref 3.8–4.8)
Alpha 1: 0.4 g/dL — ABNORMAL HIGH (ref 0.2–0.3)
Alpha 2: 1.1 g/dL — ABNORMAL HIGH (ref 0.5–0.9)
Beta 2: 0.4 g/dL (ref 0.2–0.5)
Beta Globulin: 0.5 g/dL (ref 0.4–0.6)
Gamma Globulin: 1 g/dL (ref 0.8–1.7)
Total Protein: 7.4 g/dL (ref 6.1–8.1)

## 2020-04-11 LAB — DRUG MONITOR, TRAMADOL,QN, URINE
Desmethyltramadol: 1770 ng/mL — ABNORMAL HIGH (ref <100)
Tramadol: 4254 ng/mL — ABNORMAL HIGH (ref <100)

## 2020-04-11 LAB — DRUG MONITOR, PANEL 5, W/CONF, URINE
Amphetamines: NEGATIVE ng/mL (ref <500)
Barbiturates: NEGATIVE ng/mL (ref <300)
Benzodiazepines: NEGATIVE ng/mL (ref <100)
Cocaine Metabolite: NEGATIVE ng/mL (ref <150)
Creatinine: >300 mg/dL
Marijuana Metabolite: NEGATIVE ng/mL (ref <20)
Methadone Metabolite: NEGATIVE ng/mL (ref <100)
Opiates: NEGATIVE ng/mL (ref <100)
Oxidant: NEGATIVE ug/mL
Oxycodone: NEGATIVE ng/mL (ref <100)
pH: 6.8 (ref 4.5–9.0)

## 2020-04-11 LAB — HEPATITIS B SURFACE ANTIGEN: Hepatitis B Surface Ag: NONREACTIVE

## 2020-04-11 LAB — QUANTIFERON-TB GOLD PLUS
Mitogen-NIL: 6.4 IU/mL
NIL: 0.03 IU/mL
QuantiFERON-TB Gold Plus: NEGATIVE
TB1-NIL: 0.01 IU/mL
TB2-NIL: 0 IU/mL

## 2020-04-11 LAB — HIV ANTIBODY (ROUTINE TESTING W REFLEX): HIV 1&2 Ab, 4th Generation: NONREACTIVE

## 2020-04-11 LAB — DM TEMPLATE

## 2020-04-11 LAB — CK: Total CK: 112 U/L (ref 29–143)

## 2020-04-11 LAB — RHEUMATOID FACTOR: Rheumatoid fact SerPl-aCnc: <14 IU/mL (ref <14)

## 2020-04-11 LAB — IGG, IGA, IGM
IgG (Immunoglobin G), Serum: 1088 mg/dL (ref 600–1540)
IgM, Serum: 90 mg/dL (ref 50–300)
Immunoglobulin A: 217 mg/dL (ref 70–320)

## 2020-04-11 LAB — CYCLIC CITRUL PEPTIDE ANTIBODY, IGG: Cyclic Citrullin Peptide Ab: <16 UNITS

## 2020-04-11 LAB — HEPATITIS C ANTIBODY
Hepatitis C Ab: NONREACTIVE
SIGNAL TO CUT-OFF: 0.01 (ref <1.00)

## 2020-04-11 LAB — HEPATITIS B CORE ANTIBODY, IGM: Hep B C IgM: NONREACTIVE

## 2020-04-15 ENCOUNTER — Telehealth: Payer: Self-pay | Admitting: *Deleted

## 2020-04-15 NOTE — Telephone Encounter (Signed)
Labs received from: [facility] on: 03/28/2020 Reviewed by: Dr. Pollyann Savoy at office visit 04/09/2020  Labs drawn:Sed Rate, CMP  Results: Sed Rate 39

## 2020-04-17 DIAGNOSIS — R6881 Early satiety: Secondary | ICD-10-CM | POA: Diagnosis not present

## 2020-04-17 DIAGNOSIS — K219 Gastro-esophageal reflux disease without esophagitis: Secondary | ICD-10-CM | POA: Diagnosis not present

## 2020-04-17 DIAGNOSIS — M353 Polymyalgia rheumatica: Secondary | ICD-10-CM | POA: Diagnosis not present

## 2020-04-22 DIAGNOSIS — E7800 Pure hypercholesterolemia, unspecified: Secondary | ICD-10-CM | POA: Diagnosis not present

## 2020-04-22 DIAGNOSIS — M810 Age-related osteoporosis without current pathological fracture: Secondary | ICD-10-CM | POA: Diagnosis not present

## 2020-04-22 DIAGNOSIS — R7301 Impaired fasting glucose: Secondary | ICD-10-CM | POA: Diagnosis not present

## 2020-04-24 DIAGNOSIS — L814 Other melanin hyperpigmentation: Secondary | ICD-10-CM | POA: Diagnosis not present

## 2020-04-24 DIAGNOSIS — Z85828 Personal history of other malignant neoplasm of skin: Secondary | ICD-10-CM | POA: Diagnosis not present

## 2020-04-24 DIAGNOSIS — D1801 Hemangioma of skin and subcutaneous tissue: Secondary | ICD-10-CM | POA: Diagnosis not present

## 2020-04-24 DIAGNOSIS — L723 Sebaceous cyst: Secondary | ICD-10-CM | POA: Diagnosis not present

## 2020-04-24 DIAGNOSIS — D2271 Melanocytic nevi of right lower limb, including hip: Secondary | ICD-10-CM | POA: Diagnosis not present

## 2020-04-24 DIAGNOSIS — D2262 Melanocytic nevi of left upper limb, including shoulder: Secondary | ICD-10-CM | POA: Diagnosis not present

## 2020-04-24 DIAGNOSIS — D2272 Melanocytic nevi of left lower limb, including hip: Secondary | ICD-10-CM | POA: Diagnosis not present

## 2020-04-24 DIAGNOSIS — L821 Other seborrheic keratosis: Secondary | ICD-10-CM | POA: Diagnosis not present

## 2020-04-25 ENCOUNTER — Ambulatory Visit: Payer: Medicare PPO | Admitting: Rheumatology

## 2020-04-29 DIAGNOSIS — M797 Fibromyalgia: Secondary | ICD-10-CM | POA: Diagnosis not present

## 2020-04-29 DIAGNOSIS — Z1212 Encounter for screening for malignant neoplasm of rectum: Secondary | ICD-10-CM | POA: Diagnosis not present

## 2020-04-29 DIAGNOSIS — Z0000 Encounter for general adult medical examination without abnormal findings: Secondary | ICD-10-CM | POA: Diagnosis not present

## 2020-04-29 DIAGNOSIS — D692 Other nonthrombocytopenic purpura: Secondary | ICD-10-CM | POA: Diagnosis not present

## 2020-04-29 DIAGNOSIS — I129 Hypertensive chronic kidney disease with stage 1 through stage 4 chronic kidney disease, or unspecified chronic kidney disease: Secondary | ICD-10-CM | POA: Diagnosis not present

## 2020-04-29 DIAGNOSIS — N182 Chronic kidney disease, stage 2 (mild): Secondary | ICD-10-CM | POA: Diagnosis not present

## 2020-04-29 DIAGNOSIS — R7301 Impaired fasting glucose: Secondary | ICD-10-CM | POA: Diagnosis not present

## 2020-04-29 DIAGNOSIS — R82998 Other abnormal findings in urine: Secondary | ICD-10-CM | POA: Diagnosis not present

## 2020-04-29 DIAGNOSIS — M810 Age-related osteoporosis without current pathological fracture: Secondary | ICD-10-CM | POA: Diagnosis not present

## 2020-04-29 DIAGNOSIS — R809 Proteinuria, unspecified: Secondary | ICD-10-CM | POA: Diagnosis not present

## 2020-04-29 DIAGNOSIS — E7800 Pure hypercholesterolemia, unspecified: Secondary | ICD-10-CM | POA: Diagnosis not present

## 2020-05-02 ENCOUNTER — Other Ambulatory Visit: Payer: Self-pay | Admitting: Rheumatology

## 2020-05-02 NOTE — Telephone Encounter (Signed)
We can obtain sed rate at the follow-up visit.

## 2020-05-02 NOTE — Progress Notes (Signed)
Office Visit Note  Patient: Miranda Romero             Date of Birth: Apr 18, 1945           MRN: [id number]             PCP: Gaspar Garbeisovec, Richard W, MD Referring: Gaspar Garbeisovec, Richard W, MD Visit Date: 05/10/2020 Occupation: @GUAROCC @  Subjective:  Medication management.   History of Present Illness: Miranda F Eduardo is a 75 y.o. female with history of osteoarthritis, fibromyalgia syndrome and polymyalgia rheumatica.  She states 3 days ago she fell in her bedroom and fractured her left wrist.  She was seen by Dr. Ophelia CharterYates.  He discussed surgery versus putting her in a cast.  She is in a cast in a sling now.  She will be in the cast for about 6 weeks.  She has been taking prednisone 10 mg p.o. daily for a month now.  The plan was to check sed rate today and then taper prednisone if sed rate is normal.  She continues to have some generalized achiness.  She states the trochanteric bursitis is better.  Her replaced joints are doing well.  She denies any joint swelling.  Activities of Daily Living:  Patient reports morning stiffness for 2-3 hours.   Patient Reports nocturnal pain.  Difficulty dressing/grooming: Reports Difficulty climbing stairs: Reports Difficulty getting out of chair: Reports Difficulty using hands for taps, buttons, cutlery, and/or writing: Denies  Review of Systems  Constitutional: Positive for fatigue.  HENT: Negative for mouth sores, mouth dryness and nose dryness.   Eyes: Negative for pain, itching and dryness.  Respiratory: Negative for shortness of breath and difficulty breathing.   Cardiovascular: Negative for chest pain and palpitations.  Gastrointestinal: Negative for blood in stool, constipation and diarrhea.  Endocrine: Negative for increased urination.  Genitourinary: Negative for difficulty urinating.  Musculoskeletal: Positive for arthralgias, joint pain, myalgias, morning stiffness, muscle tenderness and myalgias. Negative for joint swelling.  Skin:  Negative for color change, rash and redness.  Allergic/Immunologic: Negative for susceptible to infections.  Neurological: Positive for dizziness and numbness. Negative for headaches and memory loss.  Hematological: Positive for bruising/bleeding tendency.  Psychiatric/Behavioral: Negative for confusion.    PMFS History:  Patient Active Problem List   Diagnosis Date Noted  . Colles' fracture of left radius, initial encounter for closed fracture 05/09/2020  . Osteoarthritis of left hip 11/09/2017  . S/P hip replacement, left 11/09/2017  . Primary osteoarthritis of both hips 09/25/2016  . Fibromyalgia 04/28/2016  . Primary osteoarthritis of both knees 04/28/2016  . History of total knee replacement, bilateral 04/28/2016  . Primary osteoarthritis of both hands 04/28/2016  . Primary insomnia 04/28/2016  . History of sleep apnea 04/28/2016  . History of restless legs syndrome 04/28/2016  . Osteopenia of multiple sites 04/28/2016  . Trochanteric bursitis of left hip 02/03/2016  . Hiatal hernia with gastroesophageal reflux 11/06/2015  . Hiatal hernia 03/25/2015  . Gastroesophageal reflux disease with esophagitis 01/17/2015  . Ligamentous laxity of knee 01/05/2011  . Knee effusion, right 11/10/2010  . Knee pain 11/10/2010  . Incisional hernia 10/16/2010  . HYPERTENSION 05/14/2010  . OBSTRUCTIVE SLEEP APNEA 05/14/2010  . Obesity 04/08/2009    Past Medical History:  Diagnosis Date  . Arthritis   . Blood transfusion   . Fibromyalgia    Dr Corliss Skainseveshwar  . GERD (gastroesophageal reflux disease)   . Hiatal hernia with gastroesophageal reflux 11/06/2015  . History of hiatal hernia   .  Hyperlipidemia   . Hypertension    Dr Wylene Simmer LOV 4/13 on chart  . Seasonal allergies   . Sleep apnea    SEVERE  per sleep study 3/12 EPIC no CPAP   . Transfusion history    3 yrs ago s/p surgery for knee  . Umbilical hernia   . Weight decrease     Family History  Problem Relation Age of Onset  .  Lymphoma Mother    Past Surgical History:  Procedure Laterality Date  . ANKLE SURGERY    . APPENDECTOMY  2014  . BALLOON DILATION N/A 06/05/2015   Procedure: BALLOON DILATION;  Surgeon: Willis Modena, MD;  Location: [facility];  Service: Endoscopy;  Laterality: N/A;  . CATARACT EXTRACTION Bilateral 05/2013  . CHOLECYSTECTOMY    . DILATION AND CURETTAGE OF UTERUS    . ESOPHAGOGASTRODUODENOSCOPY (EGD) WITH PROPOFOL N/A 06/05/2015   Procedure: ESOPHAGOGASTRODUODENOSCOPY (EGD) WITH PROPOFOL;  Surgeon: Willis Modena, MD;  Location: [facility];  Service: Endoscopy;  Laterality: N/A;  . HERNIA REPAIR  05/2013   due to appendectomy with mesh  . HIATAL HERNIA REPAIR N/A 11/06/2015   Procedure: LAPAROSCOPIC REPAIR HIATAL HERNIA, Nissen FUNDOPLICATION;  Surgeon: Claud Kelp, MD;  Location: [facility];  Service: General;  Laterality: N/A;  . JOINT REPLACEMENT    . KNEE ARTHROPLASTY Bilateral   . knee infection Right    knee hardware removed 11/12/   replaced 1/13  . TOTAL HIP ARTHROPLASTY Left 11/09/2017   Procedure: LEFT TOTAL HIP ARTHROPLASTY;  Surgeon: Valeria Batman, MD;  Location: [facility];  Service: Orthopedics;  Laterality: Left;  Marland Kitchen VENTRAL HERNIA REPAIR  07/21/2011   Procedure: LAPAROSCOPIC VENTRAL HERNIA;  Surgeon: Ernestene Mention, MD;  Location: [facility];  Service: General;  Laterality: N/A;  Laparoscopic Repair Verntal Incisional Hernia and Mesh   Social History   Social History Narrative   Lives at home with her husband   They have second home in [place] where they spend a lot of time   Right handed   Caffeine: 2 cups daily   Immunization History  Administered Date(s) Administered  . Influenza Whole 11/18/2009  . Influenza, High Dose Seasonal PF 01/05/2017, 12/09/2017  . Influenza-Unspecified 11/29/2010, 01/18/2015, 11/30/2015  . PFIZER(Purple Top)SARS-COV-2 Vaccination 04/22/2019, 05/17/2019, 11/18/2019  . Pneumococcal Polysaccharide-23 11/29/2007, 04/16/2008  . Tdap 04/21/2018   . Zoster Recombinat (Shingrix) 10/18/2017     Objective: Vital Signs: BP 133/86 (BP Location: Right Arm, Patient Position: Sitting, Cuff Size: Normal)   Pulse 91   Resp 14   Ht 5\' 5"  (1.651 m)   Wt 220 lb (99.8 kg) Comment: per patient, patient refused to weigh  LMP 09/20/2000 (Exact Date)   BMI 36.61 kg/m    Physical Exam Vitals and nursing note reviewed.  Constitutional:      Appearance: She is well-developed and well-nourished.  HENT:     Head: Normocephalic and atraumatic.  Eyes:     Extraocular Movements: EOM normal.     Conjunctiva/sclera: Conjunctivae normal.  Cardiovascular:     Rate and Rhythm: Normal rate and regular rhythm.     Pulses: Intact distal pulses.     Heart sounds: Normal heart sounds.  Pulmonary:     Effort: Pulmonary effort is normal.     Breath sounds: Normal breath sounds.  Abdominal:     General: Bowel sounds are normal.     Palpations: Abdomen is soft.  Musculoskeletal:     Cervical back: Normal range of motion.  Lymphadenopathy:  Cervical: No cervical adenopathy.  Skin:    General: Skin is warm and dry.     Capillary Refill: Capillary refill takes less than 2 seconds.  Neurological:     Mental Status: She is alert and oriented to person, place, and time.  Psychiatric:        Mood and Affect: Mood and affect normal.        Behavior: Behavior normal.      Musculoskeletal Exam: C-spine was in good range of motion.  Right shoulder joint was in good range of motion.  Left arm was in a sling.  She had no tenderness over her elbows.  Right hand had no tenderness over MCPs PIPs or DIPs.  Hip joints with good range of motion.  She had no tenderness over knee joints ankles or MTPs.  She had good muscle strength in both upper and lower extremities.  CDAI Exam: CDAI Score: - Patient Global: -; Provider Global: - Swollen: -; Tender: - Joint Exam 05/10/2020   No joint exam has been documented for this visit   There is currently no  information documented on the homunculus. Go to the Rheumatology activity and complete the homunculus joint exam.  Investigation: No additional findings.  Imaging: XR Wrist Complete Left  Result Date: 05/08/2020 AP lateral left distal radius x-rays obtained and reviewed.  This shows distal radius fracture extra-articular with shortening and dorsal angulation 20 degrees.  Ulnar styloid is intact. Impression: Left Colles' fracture 20 degrees angulation.  Extra-articular fracture.  XR Wrist 2 Views Left  Result Date: 05/09/2020 Postreduction x-rays left distal radius in sugar tong splint shows correction of the dorsal angulation to neutral lower a few degrees of volar tilt.  Shortening has been corrected.  Extra-articular Colles' fracture. Impression: Comparison postreduction x-ray shows improvement in shortening and correction of dorsal angulation of left Colles' fracture.   Recent Labs: Lab Results  Component Value Date   WBC 9.3 11/11/2017   HGB 9.6 (L) 11/11/2017   PLT 214 11/11/2017   NA 141 11/11/2017   K 3.6 11/11/2017   CL 106 11/11/2017   CO2 28 11/11/2017   GLUCOSE 122 (H) 11/11/2017   BUN 10 11/11/2017   CREATININE 0.73 11/11/2017   BILITOT 0.8 11/09/2017   ALKPHOS 57 11/09/2017   AST 21 11/09/2017   ALT 18 11/09/2017   PROT 7.4 04/09/2020   ALBUMIN 3.6 11/09/2017   CALCIUM 8.3 (L) 11/11/2017   GFRAA >60 11/11/2017   QFTBGOLDPLUS NEGATIVE 04/09/2020    Speciality Comments: No specialty comments available.  Procedures:  No procedures performed Allergies: Sudafed [pseudoephedrine hcl]   Assessment / Plan:     Visit Diagnoses: Polymyalgia rheumatica (HCC) -she has been on prednisone 10 mg p.o. daily since her last visit.  She has noticed improvement in her muscle strength.  Plan: Sedimentation rate.  If the sed rate is normal we will start tapering prednisone by 1 mg every month.:  Prescription for prednisone 1 mg tablets once the lab results are available.  She has  prednisone 10 mg tablets which she can continue to half this.  She was accompanied by her husband today who is in agreement.  High risk medication use - prednisone 10 mg p.o. daily for a month.  She had labs done by Dr. Wylene Simmer recently.  Trochanteric bursitis of both hips-improved  Primary osteoarthritis of both hands-joint protection muscle strengthening was discussed.  Status post total hip replacement, left-doing well.  History of total knee replacement, bilateral-doing well but  she has instability when she walks.  She may benefit from physical therapy.  Have advised her to discuss with Dr. Ophelia Charter.  Closed Colles' fracture of left radius, sequela-she is in a cast by Dr. Ophelia Charter.  Fibromyalgia - cymbalta 30mg  by mouth at bedtime.   Medication monitoring encounter - tramadol 50 mg 1 tablet twice daily as needed.  UDS:04/09/2020 Narcotic agreement: 04/09/2020  Primary insomnia-she continues to have insomnia.  Osteopenia of multiple sites - DEXA 04/18/18 BMD 0.809 with T-score -1.1.  She is on calcium and vitamin D currently.  History of sleep apnea  History of hypertension  History of gastroesophageal reflux (GERD)  History of restless legs syndrome  Orders: Orders Placed This Encounter  Procedures  . Sedimentation rate   No orders of the defined types were placed in this encounter.    Follow-Up Instructions: Return in about 3 months (around 08/07/2020) for Osteoarthritis.   08/09/2020, MD  Note - This record has been created using Pollyann Savoy.  Chart creation errors have been sought, but may not always  have been located. Such creation errors do not reflect on  the standard of medical care.

## 2020-05-02 NOTE — Telephone Encounter (Signed)
Last Visit: 04/09/2020 Next Visit: 05/10/2020  Current Dose per office note on 04/09/2020, She will be starting on prednisone 10 mg p.o. daily for a month and if her sed rate is normal after a month then taper prednisone by 1 mg every month  Do you want patient to have SED rate redrawn at appt 05/10/2020 or before?  Labs received from: [facility] on: 03/28/2020 Reviewed by: Dr. Pollyann Savoy at office visit 04/09/2020  Labs drawn:Sed Rate, CMP  Results: Sed Rate 39  Dx: Polymyalgia rheumatica   Last Fill: 04/09/2020  Okay to refill Prednisone?

## 2020-05-08 ENCOUNTER — Encounter: Payer: Self-pay | Admitting: Orthopaedic Surgery

## 2020-05-08 ENCOUNTER — Other Ambulatory Visit: Payer: Self-pay

## 2020-05-08 ENCOUNTER — Ambulatory Visit (INDEPENDENT_AMBULATORY_CARE_PROVIDER_SITE_OTHER): Payer: Medicare PPO

## 2020-05-08 ENCOUNTER — Ambulatory Visit: Payer: Self-pay

## 2020-05-08 ENCOUNTER — Ambulatory Visit (INDEPENDENT_AMBULATORY_CARE_PROVIDER_SITE_OTHER): Payer: Medicare PPO | Admitting: Orthopaedic Surgery

## 2020-05-08 VITALS — BP 122/86 | HR 102 | Ht 65.0 in | Wt 220.0 lb

## 2020-05-08 DIAGNOSIS — M25532 Pain in left wrist: Secondary | ICD-10-CM | POA: Diagnosis not present

## 2020-05-08 DIAGNOSIS — S52532A Colles' fracture of left radius, initial encounter for closed fracture: Secondary | ICD-10-CM | POA: Diagnosis not present

## 2020-05-08 MED ORDER — HYDROCODONE-ACETAMINOPHEN 5-325 MG PO TABS: 1.0000 | ORAL_TABLET | Freq: Four times a day (QID) | ORAL | 0 refills | Status: DC | PRN

## 2020-05-08 NOTE — Progress Notes (Signed)
Office Visit Note   Patient: Miranda Romero           Date of Birth: 1945-07-25           MRN: [id number] Visit Date: 05/08/2020              Requested by: Gaspar Garbe, MD [address] [place],  [place] [address] PCP: Wylene Simmer Adelfa Koh, MD   Assessment & Plan: Visit Diagnoses:  1. Pain in left wrist   2. Colles' fracture of left radius, initial encounter for closed fracture     Plan: Hematoma block after Betadine prep fingertrap traction distraction reduction sugar tong splint application performed.  Postreduction x-rays show correction of deformity she is neutral if possibly with some volar tilt measuring 5 degrees.  Return 1 week for 2 view x-rays and her sugar tong splint.  Follow-Up Instructions: No follow-ups on file.   Orders:  Orders Placed This Encounter  Procedures   XR Wrist Complete Left   XR Wrist 2 Views Left   Meds ordered this encounter  Medications   HYDROcodone-acetaminophen (NORCO/VICODIN) 5-325 MG tablet    Sig: Take 1 tablet by mouth every 6 (six) hours as needed for moderate pain.    Dispense:  30 tablet    Refill:  0    Post wrist fracture reduction      Procedures: No procedures performed   Clinical Data: No additional findings.   Subjective: Chief Complaint  Patient presents with   Left Wrist - Pain    Fall 05/07/2020    HPI 75 year old female with recent multiple falls.  She seen a neurologist she had seen her PCP she has had partial cardiac work-up.  Some question this was blood pressure related blood pressure is adjusted.  She still sometimes gets dizzy.  She fell on 05/07/2020 suffered a left distal radius injury , extra-articular angulated and displaced with some impaction.  Patient's had multiple episodes where she passed out.  Sometimes she has a prodrome before it happens.  She vocalizes to her husband that she is getting ready to faint.  Patient seen Dr. Cleophas Dunker in the past.  Review of Systems all other systems  are noncontributory to HPI.  Patient had bilateral total knee arthroplasties.   Objective: Vital Signs: BP 122/86    Pulse (!) 102    Ht 5\' 5"  (1.651 m)    Wt 220 lb (99.8 kg)    LMP 09/20/2000 (Exact Date)    BMI 36.61 kg/m   Physical Exam Constitutional:      Appearance: She is well-developed.  HENT:     Head: Normocephalic.     Right Ear: External ear normal.     Left Ear: External ear normal.  Eyes:     Pupils: Pupils are equal, round, and reactive to light.  Neck:     Thyroid: No thyromegaly.     Trachea: No tracheal deviation.  Cardiovascular:     Rate and Rhythm: Normal rate.  Pulmonary:     Effort: Pulmonary effort is normal.  Abdominal:     Palpations: Abdomen is soft.  Skin:    General: Skin is warm and dry.  Neurological:     Mental Status: She is alert and oriented to person, place, and time.  Psychiatric:        Mood and Affect: Mood and affect normal.        Behavior: Behavior normal.     Ortho Exam patient is ecchymosis over the thenar  and over the carpal canal.  Sensation of fingertips is intact.  Swelling dorsal angulation of the distal radius.  Ulnar sensation is intact.  Specialty Comments:  No specialty comments available.  Imaging: XR Wrist Complete Left  Result Date: 05/08/2020 AP lateral left distal radius x-rays obtained and reviewed.  This shows distal radius fracture extra-articular with shortening and dorsal angulation 20 degrees.  Ulnar styloid is intact. Impression: Left Colles' fracture 20 degrees angulation.  Extra-articular fracture.    PMFS History: Patient Active Problem List   Diagnosis Date Noted   Colles' fracture of left radius, initial encounter for closed fracture 05/09/2020   Osteoarthritis of left hip 11/09/2017   S/P hip replacement, left 11/09/2017   Primary osteoarthritis of both hips 09/25/2016   Fibromyalgia 04/28/2016   Primary osteoarthritis of both knees 04/28/2016   History of total knee replacement,  bilateral 04/28/2016   Primary osteoarthritis of both hands 04/28/2016   Primary insomnia 04/28/2016   History of sleep apnea 04/28/2016   History of restless legs syndrome 04/28/2016   Osteopenia of multiple sites 04/28/2016   Trochanteric bursitis of left hip 02/03/2016   Hiatal hernia with gastroesophageal reflux 11/06/2015   Hiatal hernia 03/25/2015   Gastroesophageal reflux disease with esophagitis 01/17/2015   Ligamentous laxity of knee 01/05/2011   Knee effusion, right 11/10/2010   Knee pain 11/10/2010   Incisional hernia 10/16/2010   HYPERTENSION 05/14/2010   OBSTRUCTIVE SLEEP APNEA 05/14/2010   Obesity 04/08/2009   Past Medical History:  Diagnosis Date   Arthritis    Blood transfusion    Fibromyalgia    Dr Corliss Skains   GERD (gastroesophageal reflux disease)    Hiatal hernia with gastroesophageal reflux 11/06/2015   History of hiatal hernia    Hyperlipidemia    Hypertension    Dr Wylene Simmer LOV 4/13 on chart   Seasonal allergies    Sleep apnea    SEVERE  per sleep study 3/12 EPIC no CPAP    Transfusion history    3 yrs ago s/p surgery for knee   Umbilical hernia    Weight decrease     Family History  Problem Relation Age of Onset   Lymphoma Mother     Past Surgical History:  Procedure Laterality Date   ANKLE SURGERY     APPENDECTOMY  2014   BALLOON DILATION N/A 06/05/2015   Procedure: BALLOON DILATION;  Surgeon: Willis Modena, MD;  Location: [facility];  Service: Endoscopy;  Laterality: N/A;   CATARACT EXTRACTION Bilateral 05/2013   CHOLECYSTECTOMY     DILATION AND CURETTAGE OF UTERUS     ESOPHAGOGASTRODUODENOSCOPY (EGD) WITH PROPOFOL N/A 06/05/2015   Procedure: ESOPHAGOGASTRODUODENOSCOPY (EGD) WITH PROPOFOL;  Surgeon: Willis Modena, MD;  Location: [facility];  Service: Endoscopy;  Laterality: N/A;   HERNIA REPAIR  05/2013   due to appendectomy with mesh   HIATAL HERNIA REPAIR N/A 11/06/2015   Procedure: LAPAROSCOPIC  REPAIR HIATAL HERNIA, Nissen FUNDOPLICATION;  Surgeon: Claud Kelp, MD;  Location: [facility];  Service: General;  Laterality: N/A;   JOINT REPLACEMENT     KNEE ARTHROPLASTY Bilateral    knee infection Right    knee hardware removed 11/12/   replaced 1/13   TOTAL HIP ARTHROPLASTY Left 11/09/2017   Procedure: LEFT TOTAL HIP ARTHROPLASTY;  Surgeon: Valeria Batman, MD;  Location: [facility];  Service: Orthopedics;  Laterality: Left;   VENTRAL HERNIA REPAIR  07/21/2011   Procedure: LAPAROSCOPIC VENTRAL HERNIA;  Surgeon: Ernestene Mention, MD;  Location: [facility];  Service: General;  Laterality: N/A;  Laparoscopic Repair Verntal Incisional Hernia and Mesh   Social History   Occupational History   Occupation: Doctor, general practice Retired  Tobacco Use   Smoking status: Never Smoker   Smokeless tobacco: Never Used  Building services engineer Use: Never used  Substance and Sexual Activity   Alcohol use: Yes    Comment: socially   Drug use: No   Sexual activity: Yes    Partners: Male    Birth control/protection: Post-menopausal

## 2020-05-09 DIAGNOSIS — S52532A Colles' fracture of left radius, initial encounter for closed fracture: Secondary | ICD-10-CM | POA: Insufficient documentation

## 2020-05-10 ENCOUNTER — Other Ambulatory Visit: Payer: Self-pay

## 2020-05-10 ENCOUNTER — Ambulatory Visit: Payer: Medicare PPO | Admitting: Rheumatology

## 2020-05-10 ENCOUNTER — Encounter: Payer: Self-pay | Admitting: Rheumatology

## 2020-05-10 VITALS — BP 133/86 | HR 91 | Resp 14 | Ht 65.0 in | Wt 220.0 lb

## 2020-05-10 DIAGNOSIS — M7062 Trochanteric bursitis, left hip: Secondary | ICD-10-CM

## 2020-05-10 DIAGNOSIS — Z96653 Presence of artificial knee joint, bilateral: Secondary | ICD-10-CM | POA: Diagnosis not present

## 2020-05-10 DIAGNOSIS — M19042 Primary osteoarthritis, left hand: Secondary | ICD-10-CM

## 2020-05-10 DIAGNOSIS — M797 Fibromyalgia: Secondary | ICD-10-CM | POA: Diagnosis not present

## 2020-05-10 DIAGNOSIS — S52532S Colles' fracture of left radius, sequela: Secondary | ICD-10-CM | POA: Diagnosis not present

## 2020-05-10 DIAGNOSIS — F5101 Primary insomnia: Secondary | ICD-10-CM

## 2020-05-10 DIAGNOSIS — M19041 Primary osteoarthritis, right hand: Secondary | ICD-10-CM

## 2020-05-10 DIAGNOSIS — M353 Polymyalgia rheumatica: Secondary | ICD-10-CM | POA: Diagnosis not present

## 2020-05-10 DIAGNOSIS — M7061 Trochanteric bursitis, right hip: Secondary | ICD-10-CM

## 2020-05-10 DIAGNOSIS — Z96642 Presence of left artificial hip joint: Secondary | ICD-10-CM

## 2020-05-10 DIAGNOSIS — Z8669 Personal history of other diseases of the nervous system and sense organs: Secondary | ICD-10-CM

## 2020-05-10 DIAGNOSIS — M8589 Other specified disorders of bone density and structure, multiple sites: Secondary | ICD-10-CM

## 2020-05-10 DIAGNOSIS — Z8719 Personal history of other diseases of the digestive system: Secondary | ICD-10-CM

## 2020-05-10 DIAGNOSIS — Z79899 Other long term (current) drug therapy: Secondary | ICD-10-CM

## 2020-05-10 DIAGNOSIS — Z5181 Encounter for therapeutic drug level monitoring: Secondary | ICD-10-CM | POA: Diagnosis not present

## 2020-05-10 DIAGNOSIS — Z8679 Personal history of other diseases of the circulatory system: Secondary | ICD-10-CM

## 2020-05-10 LAB — SEDIMENTATION RATE: Sed Rate: 19 mm/h (ref 0–30)

## 2020-05-10 NOTE — Patient Instructions (Addendum)
If the Sed rate is normal, decrease prednisone by 1mg  each month.   We will call in prescription for prednisone 1 mg tablets after the sed rate is available.

## 2020-05-10 NOTE — Telephone Encounter (Addendum)
Per Dr. Corliss Skains, if sed rate is normal, patient will decrease prednisone by 1mg  each month. Patient will need 1mg  and 5mg  tablets pending sed rate results. Thanks!

## 2020-05-11 NOTE — Progress Notes (Signed)
Please notify patient that sed rate is normal.  She should taper prednisone by 1 mg every month.  She currently has prednisone 10 mg tablets which she can cut into half to make 5 mg.  Please send in prescription for prednisone 1 mg tablets, 4 tablets p.o. every morning.  This should give her a total of prednisone 9 mg p.o. daily.  Then she can decrease prednisone by 1 mg every month.

## 2020-05-13 MED ORDER — PREDNISONE 1 MG PO TABS: 4.0000 mg | ORAL_TABLET | Freq: Every day | ORAL | 0 refills | Status: DC

## 2020-05-13 NOTE — Telephone Encounter (Signed)
Please call the patient and discuss dosing with her again.

## 2020-05-13 NOTE — Telephone Encounter (Signed)
Patient advised her sed rate is normal. She should taper prednisone by 1 mg every month. She currently has prednisone 10 mg tablets which she can cut into half to make 5 mg. Prescription sent in for prednisone 1 mg tablets, 4 tabletsp.o. every morning. This should give her a total of prednisone 9 mg p.o. daily. Then she can decrease prednisone by 1 mg every month. Patient expressed understanding.

## 2020-05-13 NOTE — Telephone Encounter (Signed)
Please notify patient that sed rate is normal.  She should taper prednisone by 1 mg every month.  She currently has prednisone 10 mg tablets which she can cut into half to make 5 mg.  Please send in prescription for prednisone 1 mg tablets, 4 tablets p.o. every morning.  This should give her a total of prednisone 9 mg p.o. daily.  Then she can decrease prednisone by 1 mg every month.

## 2020-05-15 ENCOUNTER — Encounter: Payer: Self-pay | Admitting: Orthopaedic Surgery

## 2020-05-15 ENCOUNTER — Ambulatory Visit (INDEPENDENT_AMBULATORY_CARE_PROVIDER_SITE_OTHER): Payer: Medicare PPO

## 2020-05-15 ENCOUNTER — Ambulatory Visit: Payer: Medicare PPO | Admitting: Orthopaedic Surgery

## 2020-05-15 VITALS — BP 117/87 | HR 92 | Ht 65.0 in | Wt 220.0 lb

## 2020-05-15 DIAGNOSIS — S52532A Colles' fracture of left radius, initial encounter for closed fracture: Secondary | ICD-10-CM | POA: Diagnosis not present

## 2020-05-15 NOTE — Progress Notes (Signed)
Post-Op Visit Note   Patient: Miranda Romero           Date of Birth: Apr 21, 1945           MRN: [id number] Visit Date: 05/15/2020 PCP: Gaspar Garbe, MD   Assessment & Plan:GLOBAL Post distal radius reduction left.  Mild swelling of the fingers.  Finger dressing removed.  Return 1 week for repeat x-ray in her sugar tong and a position is satisfactory will apply a short arm fiberglass cast with slight wrist flexion and slight ulnar deviation.  Chief Complaint:  Chief Complaint  Patient presents with  . Left Wrist - Follow-up, Fracture    DOI 05/07/2020   Visit Diagnoses:  1. Colles' fracture of left radius, initial encounter for closed fracture     Plan: Return in 1 week plan as listed above.  Patient is taking minimal pain medication only using Tylenol currently.  Follow-Up Instructions: No follow-ups on file.   Orders:  Orders Placed This Encounter  Procedures  . XR Wrist 2 Views Left   No orders of the defined types were placed in this encounter.   Imaging: No results found.  PMFS History: Patient Active Problem List   Diagnosis Date Noted  . Colles' fracture of left radius, initial encounter for closed fracture 05/09/2020  . Osteoarthritis of left hip 11/09/2017  . S/P hip replacement, left 11/09/2017  . Primary osteoarthritis of both hips 09/25/2016  . Fibromyalgia 04/28/2016  . Primary osteoarthritis of both knees 04/28/2016  . History of total knee replacement, bilateral 04/28/2016  . Primary osteoarthritis of both hands 04/28/2016  . Primary insomnia 04/28/2016  . History of sleep apnea 04/28/2016  . History of restless legs syndrome 04/28/2016  . Osteopenia of multiple sites 04/28/2016  . Trochanteric bursitis of left hip 02/03/2016  . Hiatal hernia with gastroesophageal reflux 11/06/2015  . Hiatal hernia 03/25/2015  . Gastroesophageal reflux disease with esophagitis 01/17/2015  . Ligamentous laxity of knee 01/05/2011  . Knee effusion, right  11/10/2010  . Knee pain 11/10/2010  . Incisional hernia 10/16/2010  . HYPERTENSION 05/14/2010  . OBSTRUCTIVE SLEEP APNEA 05/14/2010  . Obesity 04/08/2009   Past Medical History:  Diagnosis Date  . Arthritis   . Blood transfusion   . Fibromyalgia    Dr Corliss Skains  . GERD (gastroesophageal reflux disease)   . Hiatal hernia with gastroesophageal reflux 11/06/2015  . History of hiatal hernia   . Hyperlipidemia   . Hypertension    Dr Wylene Simmer LOV 4/13 on chart  . Seasonal allergies   . Sleep apnea    SEVERE  per sleep study 3/12 EPIC no CPAP   . Transfusion history    3 yrs ago s/p surgery for knee  . Umbilical hernia   . Weight decrease     Family History  Problem Relation Age of Onset  . Lymphoma Mother     Past Surgical History:  Procedure Laterality Date  . ANKLE SURGERY    . APPENDECTOMY  2014  . BALLOON DILATION N/A 06/05/2015   Procedure: BALLOON DILATION;  Surgeon: Willis Modena, MD;  Location: [facility];  Service: Endoscopy;  Laterality: N/A;  . CATARACT EXTRACTION Bilateral 05/2013  . CHOLECYSTECTOMY    . DILATION AND CURETTAGE OF UTERUS    . ESOPHAGOGASTRODUODENOSCOPY (EGD) WITH PROPOFOL N/A 06/05/2015   Procedure: ESOPHAGOGASTRODUODENOSCOPY (EGD) WITH PROPOFOL;  Surgeon: Willis Modena, MD;  Location: [facility];  Service: Endoscopy;  Laterality: N/A;  . HERNIA REPAIR  05/2013  due to appendectomy with mesh  . HIATAL HERNIA REPAIR N/A 11/06/2015   Procedure: LAPAROSCOPIC REPAIR HIATAL HERNIA, Nissen FUNDOPLICATION;  Surgeon: Claud Kelp, MD;  Location: [facility];  Service: General;  Laterality: N/A;  . JOINT REPLACEMENT    . KNEE ARTHROPLASTY Bilateral   . knee infection Right    knee hardware removed 11/12/   replaced 1/13  . TOTAL HIP ARTHROPLASTY Left 11/09/2017   Procedure: LEFT TOTAL HIP ARTHROPLASTY;  Surgeon: Valeria Batman, MD;  Location: [facility];  Service: Orthopedics;  Laterality: Left;  Marland Kitchen VENTRAL HERNIA REPAIR  07/21/2011   Procedure: LAPAROSCOPIC  VENTRAL HERNIA;  Surgeon: Ernestene Mention, MD;  Location: [facility];  Service: General;  Laterality: N/A;  Laparoscopic Repair Verntal Incisional Hernia and Mesh   Social History   Occupational History  . Occupation: Doctor, general practice Retired  Tobacco Use  . Smoking status: Never Smoker  . Smokeless tobacco: Never Used  Vaping Use  . Vaping Use: Never used  Substance and Sexual Activity  . Alcohol use: Yes    Comment: socially  . Drug use: No  . Sexual activity: Yes    Partners: Male    Birth control/protection: Post-menopausal

## 2020-05-22 ENCOUNTER — Encounter: Payer: Self-pay | Admitting: Orthopaedic Surgery

## 2020-05-22 ENCOUNTER — Other Ambulatory Visit: Payer: Self-pay

## 2020-05-22 ENCOUNTER — Ambulatory Visit (INDEPENDENT_AMBULATORY_CARE_PROVIDER_SITE_OTHER): Payer: Medicare PPO

## 2020-05-22 ENCOUNTER — Ambulatory Visit: Payer: Medicare PPO | Admitting: Orthopaedic Surgery

## 2020-05-22 VITALS — BP 121/76 | HR 101 | Ht 65.0 in | Wt 220.0 lb

## 2020-05-22 DIAGNOSIS — S52532A Colles' fracture of left radius, initial encounter for closed fracture: Secondary | ICD-10-CM | POA: Diagnosis not present

## 2020-05-22 NOTE — Progress Notes (Signed)
Post-Op Visit Note   Patient: [place] F Welge           Date of Birth: 12-31-1945           MRN: [id number] Visit Date: 05/22/2020 PCP: Gaspar Garbe, MD   Assessment & Plan: Follow-up distal radius fracture.  X-rays show no change from last week.  Short arm fiberglass cast applied.  Return 4 weeks for cast off and repeat AP lateral distal radius x-rays with wrist splint.  Chief Complaint:  Chief Complaint  Patient presents with  . Left Wrist - Pain, Follow-up   Visit Diagnoses:  1. Colles' fracture of left radius, initial encounter for closed fracture     Plan: Continue elevation to decrease swelling with her new cast.  Return 4 weeks cast off x-rays as above.  Follow-Up Instructions: No follow-ups on file.   Orders:  Orders Placed This Encounter  Procedures  . XR Wrist 2 Views Left   No orders of the defined types were placed in this encounter.   Imaging: No results found.  PMFS History: Patient Active Problem List   Diagnosis Date Noted  . Colles' fracture of left radius, initial encounter for closed fracture 05/09/2020  . Osteoarthritis of left hip 11/09/2017  . S/P hip replacement, left 11/09/2017  . Primary osteoarthritis of both hips 09/25/2016  . Fibromyalgia 04/28/2016  . Primary osteoarthritis of both knees 04/28/2016  . History of total knee replacement, bilateral 04/28/2016  . Primary osteoarthritis of both hands 04/28/2016  . Primary insomnia 04/28/2016  . History of sleep apnea 04/28/2016  . History of restless legs syndrome 04/28/2016  . Osteopenia of multiple sites 04/28/2016  . Trochanteric bursitis of left hip 02/03/2016  . Hiatal hernia with gastroesophageal reflux 11/06/2015  . Hiatal hernia 03/25/2015  . Gastroesophageal reflux disease with esophagitis 01/17/2015  . Ligamentous laxity of knee 01/05/2011  . Knee effusion, right 11/10/2010  . Knee pain 11/10/2010  . Incisional hernia 10/16/2010  . HYPERTENSION 05/14/2010  .  OBSTRUCTIVE SLEEP APNEA 05/14/2010  . Obesity 04/08/2009   Past Medical History:  Diagnosis Date  . Arthritis   . Blood transfusion   . Fibromyalgia    Dr Corliss Skains  . GERD (gastroesophageal reflux disease)   . Hiatal hernia with gastroesophageal reflux 11/06/2015  . History of hiatal hernia   . Hyperlipidemia   . Hypertension    Dr Wylene Simmer LOV 4/13 on chart  . Seasonal allergies   . Sleep apnea    SEVERE  per sleep study 3/12 EPIC no CPAP   . Transfusion history    3 yrs ago s/p surgery for knee  . Umbilical hernia   . Weight decrease     Family History  Problem Relation Age of Onset  . Lymphoma Mother     Past Surgical History:  Procedure Laterality Date  . ANKLE SURGERY    . APPENDECTOMY  2014  . BALLOON DILATION N/A 06/05/2015   Procedure: BALLOON DILATION;  Surgeon: Willis Modena, MD;  Location: [facility];  Service: Endoscopy;  Laterality: N/A;  . CATARACT EXTRACTION Bilateral 05/2013  . CHOLECYSTECTOMY    . DILATION AND CURETTAGE OF UTERUS    . ESOPHAGOGASTRODUODENOSCOPY (EGD) WITH PROPOFOL N/A 06/05/2015   Procedure: ESOPHAGOGASTRODUODENOSCOPY (EGD) WITH PROPOFOL;  Surgeon: Willis Modena, MD;  [facility]: [facility];  Service: Endoscopy;  Laterality: N/A;  . HERNIA REPAIR  05/2013   due to appendectomy with mesh  . HIATAL HERNIA REPAIR N/A 11/06/2015   Procedure: LAPAROSCOPIC REPAIR  HIATAL HERNIA, Nissen FUNDOPLICATION;  Surgeon: Claud Kelp, MD;  Location: [facility];  Service: General;  Laterality: N/A;  . JOINT REPLACEMENT    . KNEE ARTHROPLASTY Bilateral   . knee infection Right    knee hardware removed 11/12/   replaced 1/13  . TOTAL HIP ARTHROPLASTY Left 11/09/2017   Procedure: LEFT TOTAL HIP ARTHROPLASTY;  Surgeon: Valeria Batman, MD;  Location: [facility];  Service: Orthopedics;  Laterality: Left;  Marland Kitchen VENTRAL HERNIA REPAIR  07/21/2011   Procedure: LAPAROSCOPIC VENTRAL HERNIA;  Surgeon: Ernestene Mention, MD;  Location: [facility];  Service: General;  Laterality:  N/A;  Laparoscopic Repair Verntal Incisional Hernia and Mesh   Social History   Occupational History  . Occupation: Doctor, general practice Retired  Tobacco Use  . Smoking status: Never Smoker  . Smokeless tobacco: Never Used  Vaping Use  . Vaping Use: Never used  Substance and Sexual Activity  . Alcohol use: Yes    Comment: socially  . Drug use: No  . Sexual activity: Yes    Partners: Male    Birth control/protection: Post-menopausal

## 2020-05-23 ENCOUNTER — Encounter: Payer: Self-pay | Admitting: Orthopaedic Surgery

## 2020-05-24 ENCOUNTER — Other Ambulatory Visit: Payer: Self-pay

## 2020-05-24 ENCOUNTER — Other Ambulatory Visit: Payer: Self-pay | Admitting: Rheumatology

## 2020-05-24 ENCOUNTER — Other Ambulatory Visit: Payer: Self-pay | Admitting: Orthopaedic Surgery

## 2020-05-24 MED ORDER — METAXALONE 400 MG PO TABS: 400.0000 mg | ORAL_TABLET | Freq: Three times a day (TID) | ORAL | 0 refills | Status: DC

## 2020-05-24 MED ORDER — DULOXETINE HCL 30 MG PO CPEP: 30.0000 mg | ORAL_CAPSULE | Freq: Every day | ORAL | 0 refills | Status: DC

## 2020-05-24 NOTE — Telephone Encounter (Signed)
Patient called requesting prescription refill of Duloxetine to be sent [facility] in [place].  Patient states she is out of medication.

## 2020-05-24 NOTE — Progress Notes (Unsigned)
akwl

## 2020-05-24 NOTE — Telephone Encounter (Signed)
Next Visit: 09/08/2020  Last Visit: 05/10/2020  Last Fill: 03/18/2020  Dx:  Fibromyalgia  Current Dose per office note on 05/10/2020, cymbalta 30mg  by mouth at bedtime  Okay to refill Duloxetine?

## 2020-05-25 ENCOUNTER — Other Ambulatory Visit: Payer: Self-pay | Admitting: Rheumatology

## 2020-05-27 ENCOUNTER — Other Ambulatory Visit: Payer: Self-pay

## 2020-05-27 MED ORDER — GABAPENTIN 300 MG PO CAPS: 300.0000 mg | ORAL_CAPSULE | Freq: Two times a day (BID) | ORAL | 0 refills | Status: DC

## 2020-05-27 NOTE — Addendum Note (Signed)
Addended by: Audrie Lia on: 05/27/2020 11:24 AM   Modules accepted: Orders

## 2020-05-27 NOTE — Telephone Encounter (Signed)
Next Visit: 08/08/2020  Last Visit: 05/10/2020  Last Fill: 03/13/2021  Dx: Polymyalgia rheumatica   Current Dose per office note on 05/10/2020, not discussed  Okay to refill Gabapentin

## 2020-05-27 NOTE — Telephone Encounter (Signed)
Patient called requesting prescription refill of Gabapentin to be sent to [facility].

## 2020-06-03 DIAGNOSIS — M353 Polymyalgia rheumatica: Secondary | ICD-10-CM | POA: Diagnosis not present

## 2020-06-03 DIAGNOSIS — M545 Low back pain, unspecified: Secondary | ICD-10-CM | POA: Diagnosis not present

## 2020-06-03 DIAGNOSIS — M797 Fibromyalgia: Secondary | ICD-10-CM | POA: Diagnosis not present

## 2020-06-03 DIAGNOSIS — S62102A Fracture of unspecified carpal bone, left wrist, initial encounter for closed fracture: Secondary | ICD-10-CM | POA: Diagnosis not present

## 2020-06-04 ENCOUNTER — Encounter: Payer: Self-pay | Admitting: Rheumatology

## 2020-06-04 ENCOUNTER — Other Ambulatory Visit: Payer: Self-pay | Admitting: Rheumatology

## 2020-06-04 NOTE — Telephone Encounter (Signed)
Next Visit: 08/08/2020  Last Visit: 05/10/2020  Last Fill: 05/02/2020  Dx: Polymyalgia rheumatica   Current Dose per office note on 05/10/2020, If the sed rate is normal we will start tapering prednisone by 1 mg every month.:  Prescription for prednisone 1 mg tablets once the lab results are available.  She has prednisone 10 mg tablets which she can continue to half this  SED rate 05/10/2020,Please notify patient that sed rate is normal. She should taper prednisone by 1 mg every month. She currently has prednisone 10 mg tablets which she can cut into half to make 5 mg. Please send in prescription for prednisone 1 mg tablets, 4 tablets p.o. every morning. This should give her a total of prednisone 9 mg p.o. daily. Then she can decrease prednisone by 1 mg every month.   Okay to refill prednisone?

## 2020-06-04 NOTE — Telephone Encounter (Signed)
LMOM for patient to return call.

## 2020-06-04 NOTE — Telephone Encounter (Signed)
Please clarify if she is out of 10 mg tablets? It may be easier to send in 5 mg tablets so she will not have to halve the tablets in order to take 9 mg daily.

## 2020-06-05 ENCOUNTER — Encounter: Payer: Self-pay | Admitting: Rheumatology

## 2020-06-05 ENCOUNTER — Other Ambulatory Visit: Payer: Self-pay | Admitting: Rheumatology

## 2020-06-05 MED ORDER — PREDNISONE 1 MG PO TABS
3.0000 mg | ORAL_TABLET | Freq: Every day | ORAL | 0 refills | Status: DC
Start: 2020-06-05 — End: 2020-07-10

## 2020-06-05 MED ORDER — PREDNISONE 5 MG PO TABS: 5.0000 mg | ORAL_TABLET | Freq: Every day | ORAL | 0 refills | Status: DC

## 2020-06-05 NOTE — Telephone Encounter (Signed)
Last Visit: 05/10/2020 Next Visit: 08/07/2020 UDS: 04/09/2020  Narc Agreement: 04/09/2020  Last Fill: 03/14/2019  Okay to refill Tramadol?

## 2020-06-05 NOTE — Telephone Encounter (Signed)
Patient responded through MyChart message, Patient needs 5 mg and 1 mg prednisone tablets only. Patient is almost out of 10 mg tablets of prednisone.

## 2020-06-06 NOTE — Telephone Encounter (Signed)
I returned patient's call and discussed about her last narcotic refill from Dr. Ophelia Charter.  Patient stated that her husband picked up the prescription for hydrocodone but she never took it.  She stated that she occasionally takes tramadol.  She had a prescription which she filled 1 year ago and it was expired.  I explained to her that she had a narcotic agreement with [place].  As she broke the narcotic agreement I would not be able to refill tramadol.  She voiced the understanding.  She stated that she will get tramadol prescription either from Dr. Ophelia Charter or Dr. Wylene Simmer.

## 2020-06-07 ENCOUNTER — Telehealth: Payer: Self-pay | Admitting: *Deleted

## 2020-06-07 ENCOUNTER — Other Ambulatory Visit: Payer: Self-pay | Admitting: *Deleted

## 2020-06-07 NOTE — Telephone Encounter (Signed)
Patient's spouse brought # 30 Hydrocodone to office to discard so that Tramadol can be prescribed by Dr. Corliss Skains. Narcotic Agreement will not be voided. Elmyra Ricks and Geroge Baseman counted and discarded medication. Tramadol can be refilled, per Dr. Corliss Skains, when patient requests Tramadol.

## 2020-06-07 NOTE — Addendum Note (Signed)
Addended by: Audrie Lia on: 06/07/2020 12:21 PM   Modules accepted: Orders

## 2020-06-11 ENCOUNTER — Encounter: Payer: Self-pay | Admitting: Orthopaedic Surgery

## 2020-06-19 ENCOUNTER — Ambulatory Visit (INDEPENDENT_AMBULATORY_CARE_PROVIDER_SITE_OTHER): Payer: Medicare PPO

## 2020-06-19 ENCOUNTER — Ambulatory Visit: Payer: Medicare PPO | Admitting: Orthopaedic Surgery

## 2020-06-19 ENCOUNTER — Encounter: Payer: Self-pay | Admitting: Orthopaedic Surgery

## 2020-06-19 ENCOUNTER — Other Ambulatory Visit: Payer: Self-pay

## 2020-06-19 VITALS — Ht 65.0 in | Wt 220.0 lb

## 2020-06-19 DIAGNOSIS — S32020A Wedge compression fracture of second lumbar vertebra, initial encounter for closed fracture: Secondary | ICD-10-CM | POA: Diagnosis not present

## 2020-06-19 DIAGNOSIS — S32000A Wedge compression fracture of unspecified lumbar vertebra, initial encounter for closed fracture: Secondary | ICD-10-CM | POA: Insufficient documentation

## 2020-06-19 DIAGNOSIS — S52532A Colles' fracture of left radius, initial encounter for closed fracture: Secondary | ICD-10-CM

## 2020-06-19 DIAGNOSIS — M545 Low back pain, unspecified: Secondary | ICD-10-CM

## 2020-06-19 MED ORDER — TRAMADOL HCL 50 MG PO TABS: 50.0000 mg | ORAL_TABLET | Freq: Four times a day (QID) | ORAL | 0 refills | Status: DC | PRN

## 2020-06-19 NOTE — Progress Notes (Signed)
Office Visit Note   Patient: Miranda Romero           Date of Birth: Feb 16, 1946           MRN: [id number] Visit Date: 06/19/2020              Requested by: Gaspar Garbe, MD [address] [place],  [place] [address] PCP: Wylene Simmer Adelfa Koh, MD   Assessment & Plan: Visit Diagnoses:  1. Colles' fracture of left radius, initial encounter for closed fracture   2. Acute right-sided low back pain, unspecified whether sciatica present   3. Compression fracture of L2 vertebra, initial encounter (HCC)     Plan: Risk 1 applied she can remove it to work on gentle wrist range of motion as tolerated.  I discussed with her that she has a L2 compression fracture.  She states when she tried to get off the exam table when she had her arm in a sugar tong splint she felt sharp pain in her back when she tried to sit up when this was in her x-ray department.  She denied any pain in her back when she fell when she originally broke her wrist many weeks ago.  She had a bone density test which was normal.  Previous bone density 2019 also normal.  She does take calcium.  She needs something for pain and she previously had been getting some Ultram from Dr. Nickola Major.  I given her some Norco for her wrist fracture.  She took this in the Dr. Nickola Major office and had not kept the prescription bottle.  I sent in 30 tablets for Ultram for her lumbar compression fracture.  After this she can follow-up with Dr. Nickola Major get her pain medication through her pain contract with Dr. Algis Downs.  I plan to recheck her in 1 month and will repeat AP lateral lumbar spine x-rays on return.  Follow-Up Instructions: Return in about 1 month (around 07/19/2020).   Orders:  Orders Placed This Encounter  Procedures  . XR Lumbar Spine 2-3 Views  . XR Wrist 2 Views Left   Meds ordered this encounter  Medications  . traMADol (ULTRAM) 50 MG tablet    Sig: Take 1 tablet (50 mg total) by mouth every 6 (six) hours as needed.    Dispense:   30 tablet    Refill:  0    L2 compression fracture pain      Procedures: No procedures performed   Clinical Data: No additional findings.   Subjective: Chief Complaint  Patient presents with  . Left Wrist - Follow-up, Fracture    DOI 05/07/2020  . Lower Back - Pain    HPI follow-up left distal radius fracture date of injury 05/07/2020.  Patient states she has had back pain since she tried to sit up from the x-ray table in our office on 05/10/2020.  She has had persistent symptoms and is requesting an x-ray of her back.  Cast is removed for left distal radius fracture and x-rays are obtained.  Review of Systems updated unchanged.   Objective: Vital Signs: Ht 5\' 5"  (1.651 m)   Wt 220 lb (99.8 kg)   LMP 09/20/2000 (Exact Date)   BMI 36.61 kg/m   Physical Exam Constitutional:      Appearance: She is well-developed.  HENT:     Head: Normocephalic.     Right Ear: External ear normal.     Left Ear: External ear normal.  Eyes:     Pupils:  Pupils are equal, round, and reactive to light.  Neck:     Thyroid: No thyromegaly.     Trachea: No tracheal deviation.  Cardiovascular:     Rate and Rhythm: Normal rate.  Pulmonary:     Effort: Pulmonary effort is normal.  Abdominal:     Palpations: Abdomen is soft.  Skin:    General: Skin is warm and dry.  Neurological:     Mental Status: She is alert and oriented to person, place, and time.  Psychiatric:        Behavior: Behavior normal.     Ortho Exam no motion distal radius left out of cast.  Mild prominence ulnar styloid.  She has 50% wrist range of motion with minimal discomfort.  Increased pain with attempts at hip flexion with back pain.  Sensation anteriorly over the thigh L5-S1 distribution is normal.  Specialty Comments:  No specialty comments available.  Imaging: XR Lumbar Spine 2-3 Views  Result Date: 06/19/2020 AP lateral lumbar spine x-rays are obtained and reviewed.  This shows previous left total hip  arthroplasty.  Slight anterolisthesis grade 1 L4-5.  L2 compression fracture 50% acute versus subacute. Impression: L2 compression fracture.  XR Wrist 2 Views Left  Result Date: 06/19/2020 2 view x-rays left wrist obtained and reviewed.  This shows again impacted distal radius fracture with shortening unchanged from 05/24/2020 images. Impression: Impacted left distal radius fracture, stable.  Some callus formation present.    PMFS History: Patient Active Problem List   Diagnosis Date Noted  . Lumbar compression fracture (HCC) 06/19/2020  . Colles' fracture of left radius, initial encounter for closed fracture 05/09/2020  . Osteoarthritis of left hip 11/09/2017  . S/P hip replacement, left 11/09/2017  . Primary osteoarthritis of both hips 09/25/2016  . Fibromyalgia 04/28/2016  . Primary osteoarthritis of both knees 04/28/2016  . History of total knee replacement, bilateral 04/28/2016  . Primary osteoarthritis of both hands 04/28/2016  . Primary insomnia 04/28/2016  . History of sleep apnea 04/28/2016  . History of restless legs syndrome 04/28/2016  . Osteopenia of multiple sites 04/28/2016  . Trochanteric bursitis of left hip 02/03/2016  . Hiatal hernia with gastroesophageal reflux 11/06/2015  . Hiatal hernia 03/25/2015  . Gastroesophageal reflux disease with esophagitis 01/17/2015  . Ligamentous laxity of knee 01/05/2011  . Knee effusion, right 11/10/2010  . Knee pain 11/10/2010  . Incisional hernia 10/16/2010  . HYPERTENSION 05/14/2010  . OBSTRUCTIVE SLEEP APNEA 05/14/2010  . Obesity 04/08/2009   Past Medical History:  Diagnosis Date  . Arthritis   . Blood transfusion   . Fibromyalgia    Dr Corliss Skains  . GERD (gastroesophageal reflux disease)   . Hiatal hernia with gastroesophageal reflux 11/06/2015  . History of hiatal hernia   . Hyperlipidemia   . Hypertension    Dr Wylene Simmer LOV 4/13 on chart  . Seasonal allergies   . Sleep apnea    SEVERE  per sleep study 3/12 EPIC no  CPAP   . Transfusion history    3 yrs ago s/p surgery for knee  . Umbilical hernia   . Weight decrease     Family History  Problem Relation Age of Onset  . Lymphoma Mother     Past Surgical History:  Procedure Laterality Date  . ANKLE SURGERY    . APPENDECTOMY  2014  . BALLOON DILATION N/A 06/05/2015   Procedure: BALLOON DILATION;  Surgeon: Willis Modena, MD;  Location: [facility];  Service: Endoscopy;  Laterality: N/A;  .  CATARACT EXTRACTION Bilateral 05/2013  . CHOLECYSTECTOMY    . DILATION AND CURETTAGE OF UTERUS    . ESOPHAGOGASTRODUODENOSCOPY (EGD) WITH PROPOFOL N/A 06/05/2015   Procedure: ESOPHAGOGASTRODUODENOSCOPY (EGD) WITH PROPOFOL;  Surgeon: Willis Modena, MD;  Location: [facility];  Service: Endoscopy;  Laterality: N/A;  . HERNIA REPAIR  05/2013   due to appendectomy with mesh  . HIATAL HERNIA REPAIR N/A 11/06/2015   Procedure: LAPAROSCOPIC REPAIR HIATAL HERNIA, Nissen FUNDOPLICATION;  Surgeon: Claud Kelp, MD;  Location: [facility];  Service: General;  Laterality: N/A;  . JOINT REPLACEMENT    . KNEE ARTHROPLASTY Bilateral   . knee infection Right    knee hardware removed 11/12/   replaced 1/13  . TOTAL HIP ARTHROPLASTY Left 11/09/2017   Procedure: LEFT TOTAL HIP ARTHROPLASTY;  Surgeon: Valeria Batman, MD;  Location: [facility];  Service: Orthopedics;  Laterality: Left;  Marland Kitchen VENTRAL HERNIA REPAIR  07/21/2011   Procedure: LAPAROSCOPIC VENTRAL HERNIA;  Surgeon: Ernestene Mention, MD;  Location: [facility];  Service: General;  Laterality: N/A;  Laparoscopic Repair Verntal Incisional Hernia and Mesh   Social History   Occupational History  . Occupation: Doctor, general practice Retired  Tobacco Use  . Smoking status: Never Smoker  . Smokeless tobacco: Never Used  Vaping Use  . Vaping Use: Never used  Substance and Sexual Activity  . Alcohol use: Yes    Comment: socially  . Drug use: No  . Sexual activity: Yes    Partners: Male    Birth control/protection: Post-menopausal

## 2020-06-20 ENCOUNTER — Telehealth: Payer: Self-pay | Admitting: Rheumatology

## 2020-06-20 NOTE — Telephone Encounter (Signed)
FYI: Patient saw Dr. Ophelia Charter yesterday for fu, and removed patient's cast. Patient was complaining of back pain, so he took an xray. Xray showed a compression fx at L3. Dr. Ophelia Charter prescribed Tramadol for pain.

## 2020-06-25 ENCOUNTER — Other Ambulatory Visit: Payer: Self-pay | Admitting: Physician Assistant

## 2020-07-08 ENCOUNTER — Encounter: Payer: Self-pay | Admitting: Rheumatology

## 2020-07-10 ENCOUNTER — Other Ambulatory Visit: Payer: Self-pay | Admitting: Rheumatology

## 2020-07-10 DIAGNOSIS — H40013 Open angle with borderline findings, low risk, bilateral: Secondary | ICD-10-CM | POA: Diagnosis not present

## 2020-07-10 MED ORDER — PREDNISONE 1 MG PO TABS: 2.0000 mg | ORAL_TABLET | Freq: Every day | ORAL | 0 refills | Status: DC

## 2020-07-10 NOTE — Telephone Encounter (Signed)
Patient requesting a refill on 1 mg Prednisone. [facility] [place].

## 2020-07-10 NOTE — Telephone Encounter (Signed)
Next Visit: 07/29/2020  Last Visit: 05/10/2020  Last Fill: 06/05/2020  Dx: Polymyalgia rheumatica  Current Dose per office note on 05/10/2020, If the sed rate is normal we will start tapering prednisone by 1 mg every month  Okay to refill prednisone?

## 2020-07-15 NOTE — Progress Notes (Signed)
Office Visit Note  Patient: Miranda Romero             Date of Birth: 1945-07-04           MRN: [id number]             PCP: Gaspar Garbe, MD Referring: Gaspar Garbe, MD Visit Date: 07/29/2020 Occupation: @GUAROCC @  Subjective:  Pain in all the joints and muscles.   History of Present Illness: F Nickelson is a 75 y.o. female with history of polymyalgia rheumatica, osteoarthritis and fibromyalgia syndrome.  She states she had left cholecyst fracture and also L2 compression fracture for which she has been seeing Dr. 73.  She continues to have a lot of discomfort in her left wrist and lower back.  She states the medications for fibromyalgia syndrome are not very helpful for her.  She would like to be referred to pain management clinic.  She states in constant discomfort.  She also has a risk of frequent falling.  She is going to [place] next week.  And will be gone for 6 months.  Activities of Daily Living:  Patient reports morning stiffness for 1 hour.   Patient Denies nocturnal pain.  Difficulty dressing/grooming: Reports Difficulty climbing stairs: Reports Difficulty getting out of chair: Reports Difficulty using hands for taps, buttons, cutlery, and/or writing: Reports  Review of Systems  Constitutional: Positive for fatigue.  HENT: Negative for mouth sores, mouth dryness and nose dryness.   Eyes: Negative for pain, itching, visual disturbance and dryness.  Respiratory: Negative for cough, hemoptysis, shortness of breath and difficulty breathing.   Cardiovascular: Positive for swelling in legs/feet. Negative for chest pain and palpitations.  Gastrointestinal: Positive for constipation and diarrhea. Negative for abdominal pain and blood in stool.  Endocrine: Negative for increased urination.  Genitourinary: Negative for painful urination.  Musculoskeletal: Positive for arthralgias, joint pain, myalgias, muscle weakness, morning stiffness, muscle tenderness and  myalgias. Negative for joint swelling.  Skin: Negative for color change, rash and redness.  Allergic/Immunologic: Negative for susceptible to infections.  Neurological: Positive for memory loss and weakness. Negative for dizziness, numbness and headaches.  Hematological: Negative for swollen glands.  Psychiatric/Behavioral: Negative for confusion and sleep disturbance.    PMFS History:  Patient Active Problem List   Diagnosis Date Noted  . Lumbar compression fracture (HCC) 06/19/2020  . Colles' fracture of left radius, initial encounter for closed fracture 05/09/2020  . Osteoarthritis of left hip 11/09/2017  . S/P hip replacement, left 11/09/2017  . Primary osteoarthritis of both hips 09/25/2016  . Fibromyalgia 04/28/2016  . Primary osteoarthritis of both knees 04/28/2016  . History of total knee replacement, bilateral 04/28/2016  . Primary osteoarthritis of both hands 04/28/2016  . Primary insomnia 04/28/2016  . History of sleep apnea 04/28/2016  . History of restless legs syndrome 04/28/2016  . Osteopenia of multiple sites 04/28/2016  . Trochanteric bursitis of left hip 02/03/2016  . Hiatal hernia with gastroesophageal reflux 11/06/2015  . Hiatal hernia 03/25/2015  . Gastroesophageal reflux disease with esophagitis 01/17/2015  . Ligamentous laxity of knee 01/05/2011  . Knee effusion, right 11/10/2010  . Knee pain 11/10/2010  . Incisional hernia 10/16/2010  . HYPERTENSION 05/14/2010  . OBSTRUCTIVE SLEEP APNEA 05/14/2010  . Obesity 04/08/2009    Past Medical History:  Diagnosis Date  . Arthritis   . Blood transfusion   . Compression fracture of L3 vertebra (HCC)   . Fibromyalgia    Dr 04/10/2009  . GERD (gastroesophageal  reflux disease)   . Hiatal hernia with gastroesophageal reflux 11/06/2015  . History of hiatal hernia   . Hyperlipidemia   . Hypertension    Dr Wylene Simmer LOV 4/13 on chart  . Seasonal allergies   . Sleep apnea    SEVERE  per sleep study 3/12 EPIC no  CPAP   . Transfusion history    3 yrs ago s/p surgery for knee  . Umbilical hernia   . Weight decrease   . Wrist fracture    Left wrist    Family History  Problem Relation Age of Onset  . Lymphoma Mother    Past Surgical History:  Procedure Laterality Date  . ANKLE SURGERY    . APPENDECTOMY  2014  . BALLOON DILATION N/A 06/05/2015   Procedure: BALLOON DILATION;  Surgeon: Willis Modena, MD;  Location: [facility];  Service: Endoscopy;  Laterality: N/A;  . CATARACT EXTRACTION Bilateral 05/2013  . CHOLECYSTECTOMY    . DILATION AND CURETTAGE OF UTERUS    . ESOPHAGOGASTRODUODENOSCOPY (EGD) WITH PROPOFOL N/A 06/05/2015   Procedure: ESOPHAGOGASTRODUODENOSCOPY (EGD) WITH PROPOFOL;  Surgeon: Willis Modena, MD;  Location: [facility];  Service: Endoscopy;  Laterality: N/A;  . HERNIA REPAIR  05/2013   due to appendectomy with mesh  . HIATAL HERNIA REPAIR N/A 11/06/2015   Procedure: LAPAROSCOPIC REPAIR HIATAL HERNIA, Nissen FUNDOPLICATION;  Surgeon: Claud Kelp, MD;  Location: [facility];  Service: General;  Laterality: N/A;  . JOINT REPLACEMENT    . KNEE ARTHROPLASTY Bilateral   . knee infection Right    knee hardware removed 11/12/   replaced 1/13  . TOTAL HIP ARTHROPLASTY Left 11/09/2017   Procedure: LEFT TOTAL HIP ARTHROPLASTY;  Surgeon: Valeria Batman, MD;  Location: [facility];  Service: Orthopedics;  Laterality: Left;  Marland Kitchen VENTRAL HERNIA REPAIR  07/21/2011   Procedure: LAPAROSCOPIC VENTRAL HERNIA;  Surgeon: Ernestene Mention, MD;  Location: [facility];  Service: General;  Laterality: N/A;  Laparoscopic Repair Verntal Incisional Hernia and Mesh   Social History   Social History Narrative   Lives at home with her husband   They have second home in [place] where they spend a lot of time   Right handed   Caffeine: 2 cups daily   Immunization History  Administered Date(s) Administered  . Influenza Whole 11/18/2009  . Influenza, High Dose Seasonal PF 01/05/2017, 12/09/2017  .  Influenza-Unspecified 11/29/2010, 01/18/2015, 11/30/2015  . PFIZER(Purple Top)SARS-COV-2 Vaccination 04/22/2019, 05/17/2019, 11/18/2019, 07/25/2020  . Pneumococcal Polysaccharide-23 11/29/2007, 04/16/2008  . Tdap 04/21/2018  . Zoster Recombinat (Shingrix) 10/18/2017     Objective: Vital Signs: BP 104/66 (BP Location: Left Arm, Patient Position: Sitting, Cuff Size: Normal)   Pulse 99   Ht 5\' 5"  (1.651 m)   Wt 220 lb 6.4 oz (100 kg)   LMP 09/20/2000 (Exact Date)   BMI 36.68 kg/m    Physical Exam Vitals and nursing note reviewed.  Constitutional:      Appearance: She is well-developed.  HENT:     Head: Normocephalic and atraumatic.  Eyes:     Conjunctiva/sclera: Conjunctivae normal.  Cardiovascular:     Rate and Rhythm: Normal rate and regular rhythm.     Heart sounds: Normal heart sounds.  Pulmonary:     Effort: Pulmonary effort is normal.     Breath sounds: Normal breath sounds.  Abdominal:     General: Bowel sounds are normal.     Palpations: Abdomen is soft.  Musculoskeletal:     Cervical back: Normal range of  motion.  Lymphadenopathy:     Cervical: No cervical adenopathy.  Skin:    General: Skin is warm and dry.     Capillary Refill: Capillary refill takes less than 2 seconds.  Neurological:     Mental Status: She is alert and oriented to person, place, and time.  Psychiatric:        Behavior: Behavior normal.      Musculoskeletal Exam: C-spine was in good range of motion.  Lumbar spine was in limited range of motion with discomfort.  Shoulder joints, elbow joints, wrist joints, MCPs PIPs and DIPs with good range of motion with no synovitis.  Hip joints with good range of motion.  She discomfort range of motion of her knee joints without any warmth swelling or effusion.  There is no tenderness over ankles or MTPs.  She has generalized hyperalgesia and positive tender points.  CDAI Exam: CDAI Score: -- Patient Global: --; Provider Global: -- Swollen: --; Tender:  -- Joint Exam 07/29/2020   No joint exam has been documented for this visit   There is currently no information documented on the homunculus. Go to the Rheumatology activity and complete the homunculus joint exam.  Investigation: No additional findings.  Imaging: XR Lumbar Spine 2-3 Views  Result Date: 07/26/2020 AP lateral lumbar spine x-rays were obtained and reviewed.  This shows L2 compression fracture approximately 40-50% compression with sclerotic healing no further compression comparison to 06/19/2020 images. Impression: Subacute L2 compression fracture without further compression versus previous images.  L5-S1 disc space narrowing and previous left total of arthroplasty noted.   Recent Labs: Lab Results  Component Value Date   WBC 9.3 11/11/2017   HGB 9.6 (L) 11/11/2017   PLT 214 11/11/2017   NA 141 11/11/2017   K 3.6 11/11/2017   CL 106 11/11/2017   CO2 28 11/11/2017   GLUCOSE 122 (H) 11/11/2017   BUN 10 11/11/2017   CREATININE 0.73 11/11/2017   BILITOT 0.8 11/09/2017   ALKPHOS 57 11/09/2017   AST 21 11/09/2017   ALT 18 11/09/2017   PROT 7.4 04/09/2020   ALBUMIN 3.6 11/09/2017   CALCIUM 8.3 (L) 11/11/2017   GFRAA >60 11/11/2017   QFTBGOLDPLUS NEGATIVE 04/09/2020   22,022 CMP normal,  Speciality Comments: No specialty comments available.  Procedures:  No procedures performed Allergies: Sudafed [pseudoephedrine hcl]   Assessment / Plan:     Visit Diagnoses: Polymyalgia rheumatica (HCC)-patient was diagnosed with polymyalgia rheumatica few months back.  She has been tapering prednisone gradually and is currently on prednisone 7 mg p.o. daily.  She has no muscular weakness or tenderness on my examination today.  Although she has some generalized deconditioning.  She would benefit from physical therapy.  She states she has a prescription for physical therapy but she will wait until she comes back from [place].  High risk medication use - prednisone 7mg  by mouth daily.   Long-term side effects of prednisone use were discussed.  Dietary modifications and exercise were discussed.  Trochanteric bursitis of both hips-she continues to have some trochanteric bursa discomfort.  IT band stretches were discussed.  Primary osteoarthritis of both hands-she has some osteoarthritis in her hands.  She is also lost muscle mass in her left hand since her left wrist fracture.  Muscle strengthening exercises were discussed.  Closed Colles' fracture of left radius, sequela - Dr. [place].  Status post total hip replacement, left-doing well.  History of total knee replacement, bilateral-she still has some discomfort but no warmth swelling  or effusion was noted.  Closed compression fracture of L2 vertebra, sequela-she had recent compression fracture after her fall.  She she states she continues to have some lower back discomfort.  She would like to be referred to pain management.  She states current regimen is not controlling her pain symptoms.  Osteopenia of multiple sites - DEXA 04/18/18 BMD 0.809 with T-score -1.1.  I reviewed her bone density from April 29, 2020 which showed a T score of -1.2 in the right total hip.  Use of calcium rich diet and exercise was emphasized.  Fibromyalgia-she continues to have some generalized pain and discomfort.  She is on combination of gabapentin and Cymbalta.  According the patient these medications are not able to control her pain symptoms.  Primary insomnia-good sleep hygiene was discussed.  Medication monitoring encounter - tramadol 50 mg 1 tablet twice daily as needed.  UDS:04/09/2020 Narcotic agreement: 04/09/2020  History of hypertension-her blood pressure is normal today.  History of sleep apnea  History of restless legs syndrome  History of gastroesophageal reflux (GERD)  Orders: Orders Placed This Encounter  Procedures  . Ambulatory referral to Physical Medicine Rehab   No orders of the defined types were placed in this  encounter.    Follow-Up Instructions: Return in about 6 months (around 01/29/2021) for Osteoarthritis, FMS, Polymyalgia rheumatica.   Pollyann Savoy, MD  Note - This record has been created using Animal nutritionist.  Chart creation errors have been sought, but may not always  have been located. Such creation errors do not reflect on  the standard of medical care.

## 2020-07-23 ENCOUNTER — Ambulatory Visit: Payer: Medicare PPO | Admitting: Orthopaedic Surgery

## 2020-07-23 ENCOUNTER — Encounter: Payer: Self-pay | Admitting: Orthopaedic Surgery

## 2020-07-23 ENCOUNTER — Ambulatory Visit (INDEPENDENT_AMBULATORY_CARE_PROVIDER_SITE_OTHER): Payer: Medicare PPO

## 2020-07-23 VITALS — BP 138/91 | HR 97 | Ht 65.0 in | Wt 220.0 lb

## 2020-07-23 DIAGNOSIS — S32020A Wedge compression fracture of second lumbar vertebra, initial encounter for closed fracture: Secondary | ICD-10-CM

## 2020-07-23 DIAGNOSIS — S52532A Colles' fracture of left radius, initial encounter for closed fracture: Secondary | ICD-10-CM

## 2020-07-23 NOTE — Progress Notes (Signed)
Office Visit Note   Patient: Miranda Romero           Date of Birth: [birth year]           MRN: [id number] Visit Date: 07/23/2020              Requested by: Gaspar Garbe, MD [address] [place],  [place] [address] PCP: Wylene Simmer Adelfa Koh, MD   Assessment & Plan: Visit Diagnoses:  1. Compression fracture of L2 vertebra, initial encounter (HCC)   2. Colles' fracture of left radius, initial encounter for closed fracture     Plan: Patient is getting slow gradual improvement from the lumbar compression fracture as expected.  Wrist is doing well she will continue to gradually increase use of her hand and will return in 2 months with lateral lumbar single x-ray on return.  Follow-Up Instructions: No follow-ups on file.   Orders:  Orders Placed This Encounter  Procedures  . XR Lumbar Spine 2-3 Views   No orders of the defined types were placed in this encounter.     Procedures: No procedures performed   Clinical Data: No additional findings.   Subjective: Chief Complaint  Patient presents with  . Lower Back - Fracture, Follow-up    HPI follow-up left distal radius fracture from fall 05/07/2020.  L2 compression fracture.  Back still bothering her but gradually is improving.  She is taking 2 tramadol tablets a day.  No associated bowel or bladder symptoms no myelopathic symptoms.  Distal radius was treated with cast immobilization.  She is working on wrist range of motion and states the wrist is doing well.  Review of Systems reviewed updated unchanged from 06/19/2020.   Objective: Vital Signs: BP (!) 138/91   Pulse 97   Ht 5\' 5"  (1.651 m)   Wt 220 lb (99.8 kg)   LMP 09/20/2000 (Exact Date)   BMI 36.61 kg/m   Physical Exam Constitutional:      Appearance: She is well-developed.  HENT:     Head: Normocephalic.     Right Ear: External ear normal.     Left Ear: External ear normal.  Eyes:     Pupils: Pupils are equal, round, and reactive to light.   Neck:     Thyroid: No thyromegaly.     Trachea: No tracheal deviation.  Cardiovascular:     Rate and Rhythm: Normal rate.  Pulmonary:     Effort: Pulmonary effort is normal.  Abdominal:     Palpations: Abdomen is soft.  Skin:    General: Skin is warm and dry.  Neurological:     Mental Status: She is alert and oriented to person, place, and time.  Psychiatric:        Behavior: Behavior normal.     Ortho Exam slight prominence of the left ulnar styloid.  No tenderness at the fracture site distal radius.  Good finger flexion and extension. Specialty Comments:  No specialty comments available.  Imaging: No results found.   PMFS History: Patient Active Problem List   Diagnosis Date Noted  . Lumbar compression fracture (HCC) 06/19/2020  . Colles' fracture of left radius, initial encounter for closed fracture 05/09/2020  . Osteoarthritis of left hip 11/09/2017  . S/P hip replacement, left 11/09/2017  . Primary osteoarthritis of both hips 09/25/2016  . Fibromyalgia 04/28/2016  . Primary osteoarthritis of both knees 04/28/2016  . History of total knee replacement, bilateral 04/28/2016  . Primary osteoarthritis of both hands 04/28/2016  .  Primary insomnia 04/28/2016  . History of sleep apnea 04/28/2016  . History of restless legs syndrome 04/28/2016  . Osteopenia of multiple sites 04/28/2016  . Trochanteric bursitis of left hip 02/03/2016  . Hiatal hernia with gastroesophageal reflux 11/06/2015  . Hiatal hernia 03/25/2015  . Gastroesophageal reflux disease with esophagitis 01/17/2015  . Ligamentous laxity of knee 01/05/2011  . Knee effusion, right 11/10/2010  . Knee pain 11/10/2010  . Incisional hernia 10/16/2010  . HYPERTENSION 05/14/2010  . OBSTRUCTIVE SLEEP APNEA 05/14/2010  . Obesity 04/08/2009   Past Medical History:  Diagnosis Date  . Arthritis   . Blood transfusion   . Fibromyalgia    Dr Corliss Skains  . GERD (gastroesophageal reflux disease)   . Hiatal hernia  with gastroesophageal reflux 11/06/2015  . History of hiatal hernia   . Hyperlipidemia   . Hypertension    Dr Wylene Simmer LOV 4/13 on chart  . Seasonal allergies   . Sleep apnea    SEVERE  per sleep study 3/12 EPIC no CPAP   . Transfusion history    3 yrs ago s/p surgery for knee  . Umbilical hernia   . Weight decrease     Family History  Problem Relation Age of Onset  . Lymphoma Mother     Past Surgical History:  Procedure Laterality Date  . ANKLE SURGERY    . APPENDECTOMY  2014  . BALLOON DILATION N/A 06/05/2015   Procedure: BALLOON DILATION;  Surgeon: Willis Modena, MD;  Location: [facility];  Service: Endoscopy;  Laterality: N/A;  . CATARACT EXTRACTION Bilateral 05/2013  . CHOLECYSTECTOMY    . DILATION AND CURETTAGE OF UTERUS    . ESOPHAGOGASTRODUODENOSCOPY (EGD) WITH PROPOFOL N/A 06/05/2015   Procedure: ESOPHAGOGASTRODUODENOSCOPY (EGD) WITH PROPOFOL;  Surgeon: Willis Modena, MD;  Location: [facility];  Service: Endoscopy;  Laterality: N/A;  . HERNIA REPAIR  05/2013   due to appendectomy with mesh  . HIATAL HERNIA REPAIR N/A 11/06/2015   Procedure: LAPAROSCOPIC REPAIR HIATAL HERNIA, Nissen FUNDOPLICATION;  Surgeon: Claud Kelp, MD;  Location: [facility];  Service: General;  Laterality: N/A;  . JOINT REPLACEMENT    . KNEE ARTHROPLASTY Bilateral   . knee infection Right    knee hardware removed 11/12/   replaced 1/13  . TOTAL HIP ARTHROPLASTY Left 11/09/2017   Procedure: LEFT TOTAL HIP ARTHROPLASTY;  Surgeon: Valeria Batman, MD;  Location: [facility];  Service: Orthopedics;  Laterality: Left;  Marland Kitchen VENTRAL HERNIA REPAIR  07/21/2011   Procedure: LAPAROSCOPIC VENTRAL HERNIA;  Surgeon: Ernestene Mention, MD;  Location: [facility];  Service: General;  Laterality: N/A;  Laparoscopic Repair Verntal Incisional Hernia and Mesh   Social History   Occupational History  . Occupation: Doctor, general practice Retired  Tobacco Use  . Smoking status: Never Smoker  . Smokeless tobacco: Never Used  Vaping  Use  . Vaping Use: Never used  Substance and Sexual Activity  . Alcohol use: Yes    Comment: socially  . Drug use: No  . Sexual activity: Yes    Partners: Male    Birth control/protection: Post-menopausal

## 2020-07-24 DIAGNOSIS — H40013 Open angle with borderline findings, low risk, bilateral: Secondary | ICD-10-CM | POA: Diagnosis not present

## 2020-07-25 ENCOUNTER — Encounter: Payer: Self-pay | Admitting: Rheumatology

## 2020-07-26 ENCOUNTER — Telehealth: Payer: Self-pay | Admitting: *Deleted

## 2020-07-26 NOTE — Telephone Encounter (Signed)
Received DEXA results from [facility]  Date of Scan: 06/03/2020 Lowest T-score and lowest site measured: T-score -1.2 Right total hip Significant changes in BMD and site measured (5% and above): n/a  Current Regimen: Calcium and Vitamin D  Recommendation: none  Reviewed by: Sherron Ales, PA-C  Next Appointment: 07/29/2020

## 2020-07-29 ENCOUNTER — Encounter: Payer: Self-pay | Admitting: Rheumatology

## 2020-07-29 ENCOUNTER — Other Ambulatory Visit: Payer: Self-pay

## 2020-07-29 ENCOUNTER — Ambulatory Visit: Payer: Medicare PPO | Admitting: Rheumatology

## 2020-07-29 VITALS — BP 104/66 | HR 99 | Ht 65.0 in | Wt 220.4 lb

## 2020-07-29 DIAGNOSIS — S52532S Colles' fracture of left radius, sequela: Secondary | ICD-10-CM

## 2020-07-29 DIAGNOSIS — Z8679 Personal history of other diseases of the circulatory system: Secondary | ICD-10-CM

## 2020-07-29 DIAGNOSIS — M7061 Trochanteric bursitis, right hip: Secondary | ICD-10-CM

## 2020-07-29 DIAGNOSIS — Z5181 Encounter for therapeutic drug level monitoring: Secondary | ICD-10-CM

## 2020-07-29 DIAGNOSIS — M353 Polymyalgia rheumatica: Secondary | ICD-10-CM

## 2020-07-29 DIAGNOSIS — Z96642 Presence of left artificial hip joint: Secondary | ICD-10-CM

## 2020-07-29 DIAGNOSIS — Z96653 Presence of artificial knee joint, bilateral: Secondary | ICD-10-CM | POA: Diagnosis not present

## 2020-07-29 DIAGNOSIS — Z8669 Personal history of other diseases of the nervous system and sense organs: Secondary | ICD-10-CM

## 2020-07-29 DIAGNOSIS — F5101 Primary insomnia: Secondary | ICD-10-CM

## 2020-07-29 DIAGNOSIS — M19042 Primary osteoarthritis, left hand: Secondary | ICD-10-CM

## 2020-07-29 DIAGNOSIS — S32020S Wedge compression fracture of second lumbar vertebra, sequela: Secondary | ICD-10-CM | POA: Diagnosis not present

## 2020-07-29 DIAGNOSIS — M19041 Primary osteoarthritis, right hand: Secondary | ICD-10-CM | POA: Diagnosis not present

## 2020-07-29 DIAGNOSIS — M8589 Other specified disorders of bone density and structure, multiple sites: Secondary | ICD-10-CM

## 2020-07-29 DIAGNOSIS — M797 Fibromyalgia: Secondary | ICD-10-CM

## 2020-07-29 DIAGNOSIS — Z8719 Personal history of other diseases of the digestive system: Secondary | ICD-10-CM

## 2020-07-29 DIAGNOSIS — M7062 Trochanteric bursitis, left hip: Secondary | ICD-10-CM

## 2020-07-29 DIAGNOSIS — Z79899 Other long term (current) drug therapy: Secondary | ICD-10-CM

## 2020-07-29 NOTE — Telephone Encounter (Signed)
Attempted to contact patient and left message on machine to advise patient to call the office.   I need to speak with patient about prednisone refills and UDS for tramadol.

## 2020-07-30 MED ORDER — PREDNISONE 1 MG PO TABS: 1.0000 mg | ORAL_TABLET | Freq: Every day | ORAL | 0 refills | Status: DC

## 2020-07-30 NOTE — Telephone Encounter (Signed)
Spoke with the patient and advised her that we are unable to send in 6 months worth of the prednisone taper. Patient will be in [place] and will contact the office with a pharmacy location when refill is needed. Next months worth of prednisone can be sent in now though. Patient verbalized understanding. Also, advised patient we need updated UDS for the tramadol and the patient stated, "I am not worried about that now, I am okay".   A 90 day supply of prednisone 5mg  tablets was sent to the pharmacy on 06/05/2020. A 30 day supply of prednisone 1mg  tablets was sent to the pharmacy on 07/10/2020.  Please review prednisone 1mg  prescription and send to the pharmacy.

## 2020-08-06 ENCOUNTER — Other Ambulatory Visit: Payer: Self-pay | Admitting: Physician Assistant

## 2020-08-06 NOTE — Telephone Encounter (Signed)
Next Visit: 01/29/2021  Last Visit: 07/29/2020  Last Fill: 05/27/2020  Dx: Fibromyalgia  Current Dose per office note on 07/29/2020, She is on combination of gabapentin   Okay to refill Gabapentin?

## 2020-08-07 DIAGNOSIS — R1013 Epigastric pain: Secondary | ICD-10-CM | POA: Diagnosis not present

## 2020-08-07 DIAGNOSIS — M353 Polymyalgia rheumatica: Secondary | ICD-10-CM | POA: Diagnosis not present

## 2020-08-07 DIAGNOSIS — K219 Gastro-esophageal reflux disease without esophagitis: Secondary | ICD-10-CM | POA: Diagnosis not present

## 2020-08-08 ENCOUNTER — Ambulatory Visit: Payer: Medicare PPO | Admitting: Rheumatology

## 2020-08-22 ENCOUNTER — Emergency Department (HOSPITAL_COMMUNITY): Payer: Medicare PPO

## 2020-08-22 ENCOUNTER — Emergency Department (HOSPITAL_COMMUNITY)
Admission: EM | Admit: 2020-08-22 | Discharge: 2020-08-22 | Disposition: A | Discharge status: Home / Self Care | Payer: Medicare PPO | Attending: Emergency Medicine | Admitting: Emergency Medicine

## 2020-08-22 DIAGNOSIS — Z96642 Presence of left artificial hip joint: Secondary | ICD-10-CM | POA: Diagnosis not present

## 2020-08-22 DIAGNOSIS — Z7982 Long term (current) use of aspirin: Secondary | ICD-10-CM | POA: Insufficient documentation

## 2020-08-22 DIAGNOSIS — Z79899 Other long term (current) drug therapy: Secondary | ICD-10-CM | POA: Insufficient documentation

## 2020-08-22 DIAGNOSIS — R1084 Generalized abdominal pain: Secondary | ICD-10-CM | POA: Diagnosis not present

## 2020-08-22 DIAGNOSIS — R112 Nausea with vomiting, unspecified: Secondary | ICD-10-CM | POA: Diagnosis not present

## 2020-08-22 DIAGNOSIS — K219 Gastro-esophageal reflux disease without esophagitis: Secondary | ICD-10-CM | POA: Insufficient documentation

## 2020-08-22 DIAGNOSIS — Z96653 Presence of artificial knee joint, bilateral: Secondary | ICD-10-CM | POA: Insufficient documentation

## 2020-08-22 DIAGNOSIS — K2900 Acute gastritis without bleeding: Secondary | ICD-10-CM | POA: Diagnosis not present

## 2020-08-22 DIAGNOSIS — R000 Tachycardia, unspecified: Secondary | ICD-10-CM | POA: Insufficient documentation

## 2020-08-22 DIAGNOSIS — R109 Unspecified abdominal pain: Secondary | ICD-10-CM | POA: Diagnosis not present

## 2020-08-22 DIAGNOSIS — R1110 Vomiting, unspecified: Secondary | ICD-10-CM | POA: Diagnosis not present

## 2020-08-22 DIAGNOSIS — I10 Essential (primary) hypertension: Secondary | ICD-10-CM | POA: Diagnosis not present

## 2020-08-22 DIAGNOSIS — R231 Pallor: Secondary | ICD-10-CM | POA: Diagnosis not present

## 2020-08-22 LAB — COMPREHENSIVE METABOLIC PANEL
ALT: 24 U/L (ref 0–44)
AST: 96 U/L — ABNORMAL HIGH (ref 15–41)
Albumin: 2.4 g/dL — ABNORMAL LOW (ref 3.5–5.0)
Alkaline Phosphatase: 46 U/L (ref 38–126)
Anion gap: 10 (ref 5–15)
BUN: 24 mg/dL — ABNORMAL HIGH (ref 8–23)
CO2: 21 mmol/L — ABNORMAL LOW (ref 22–32)
Calcium: 7.4 mg/dL — ABNORMAL LOW (ref 8.9–10.3)
Chloride: 107 mmol/L (ref 98–111)
Creatinine, Ser: 0.86 mg/dL (ref 0.44–1.00)
GFR, Estimated: >60 mL/min (ref >60)
Glucose, Bld: 146 mg/dL — ABNORMAL HIGH (ref 70–99)
Potassium: 5.1 mmol/L (ref 3.5–5.1)
Sodium: 138 mmol/L (ref 135–145)
Total Bilirubin: 1.6 mg/dL — ABNORMAL HIGH (ref 0.3–1.2)
Total Protein: 4.8 g/dL — ABNORMAL LOW (ref 6.5–8.1)

## 2020-08-22 LAB — CBC WITH DIFFERENTIAL/PLATELET
Abs Immature Granulocytes: 0.25 10*3/uL — ABNORMAL HIGH (ref 0.00–0.07)
Basophils Absolute: 0.1 10*3/uL (ref 0.0–0.1)
Basophils Relative: 0 %
Eosinophils Absolute: 0 10*3/uL (ref 0.0–0.5)
Eosinophils Relative: 0 %
HCT: 53.6 % — ABNORMAL HIGH (ref 36.0–46.0)
Hemoglobin: 17.8 g/dL — ABNORMAL HIGH (ref 12.0–15.0)
Immature Granulocytes: 1 %
Lymphocytes Relative: 5 %
Lymphs Abs: 1.2 10*3/uL (ref 0.7–4.0)
MCH: 30.9 pg (ref 26.0–34.0)
MCHC: 33.2 g/dL (ref 30.0–36.0)
MCV: 93.1 fL (ref 80.0–100.0)
Monocytes Absolute: 1.2 10*3/uL — ABNORMAL HIGH (ref 0.1–1.0)
Monocytes Relative: 5 %
Neutro Abs: 19.3 10*3/uL — ABNORMAL HIGH (ref 1.7–7.7)
Neutrophils Relative %: 89 %
Platelets: 401 10*3/uL — ABNORMAL HIGH (ref 150–400)
RBC: 5.76 MIL/uL — ABNORMAL HIGH (ref 3.87–5.11)
RDW: 14.3 % (ref 11.5–15.5)
WBC: 22 10*3/uL — ABNORMAL HIGH (ref 4.0–10.5)
nRBC: 0 % (ref 0.0–0.2)

## 2020-08-22 LAB — LACTIC ACID, PLASMA
Lactic Acid, Venous: 3 mmol/L (ref 0.5–1.9)
Lactic Acid, Venous: 2.9 mmol/L (ref 0.5–1.9)

## 2020-08-22 LAB — URINALYSIS, ROUTINE W REFLEX MICROSCOPIC
Bilirubin Urine: NEGATIVE
Glucose, UA: NEGATIVE mg/dL
Hgb urine dipstick: NEGATIVE
Ketones, ur: 20 mg/dL — AB
Leukocytes,Ua: NEGATIVE
Nitrite: NEGATIVE
Protein, ur: 30 mg/dL — AB
Specific Gravity, Urine: 1.029 (ref 1.005–1.030)
pH: 5 (ref 5.0–8.0)

## 2020-08-22 LAB — LIPASE, BLOOD: Lipase: 24 U/L (ref 11–51)

## 2020-08-22 MED ORDER — ONDANSETRON 8 MG PO TBDP: 8.0000 mg | ORAL_TABLET | Freq: Three times a day (TID) | ORAL | 0 refills | Status: DC | PRN

## 2020-08-22 MED ORDER — ONDANSETRON HCL 4 MG/2ML IJ SOLN
4.0000 mg | Freq: Once | INTRAMUSCULAR | Status: AC
Start: 2020-08-22 — End: 2020-08-22
  Administered 2020-08-22: 4 mg via INTRAVENOUS
  Filled 2020-08-22: qty 2

## 2020-08-22 MED ORDER — SODIUM CHLORIDE 0.9 % IV BOLUS
1000.0000 mL | Freq: Once | INTRAVENOUS | Status: AC
  Administered 2020-08-22: 1000 mL via INTRAVENOUS

## 2020-08-22 MED ORDER — IOHEXOL 300 MG/ML  SOLN
100.0000 mL | Freq: Once | INTRAMUSCULAR | Status: AC | PRN
  Administered 2020-08-22: 100 mL via INTRAVENOUS

## 2020-08-22 MED ORDER — SODIUM CHLORIDE 0.9 % IV BOLUS
1000.0000 mL | Freq: Once | INTRAVENOUS | Status: AC
Start: 2020-08-22 — End: 2020-08-22
  Administered 2020-08-22: 1000 mL via INTRAVENOUS

## 2020-08-22 NOTE — Discharge Instructions (Addendum)
Take the medications as prescribed to help with the nausea and vomiting.  Continue your current medications.  Follow-up with your GI doctor as planned.  Return as needed for worsening symptoms

## 2020-08-22 NOTE — ED Notes (Signed)
Patient transported to CT 

## 2020-08-22 NOTE — ED Provider Notes (Signed)
[facility] EMERGENCY DEPARTMENT Provider Note   CSN: [id number] Arrival date & time: 08/22/20  1224     History Chief Complaint  Patient presents with   Abdominal Pain   Emesis    Miranda Romero is a 75 y.o. female.  75 year old female with prior medical history as detailed below presents for evaluation.  Patient reports onset of significant vomiting overnight.  She reports multiple episodes of vomiting through the course of the night and early morning.  She denies fever.  She denies diarrhea.  She reports having formed stool with nearly every episode of vomiting.  She denies significant abdominal pain.  She reports mild mid abdominal discomfort with sitting up.  The history is provided by the patient and medical records.  Emesis Severity:  Severe Duration:  1 day Timing:  Constant Number of daily episodes:  20 Progression:  Unchanged Chronicity:  New Relieved by:  Nothing     Past Medical History:  Diagnosis Date   Arthritis    Blood transfusion    Compression fracture of L3 vertebra (HCC)    Fibromyalgia    Dr Corliss Skains   GERD (gastroesophageal reflux disease)    Hiatal hernia with gastroesophageal reflux 11/06/2015   History of hiatal hernia    Hyperlipidemia    Hypertension    Dr Wylene Simmer LOV 4/13 on chart   Seasonal allergies    Sleep apnea    SEVERE  per sleep study 3/12 EPIC no CPAP    Transfusion history    3 yrs ago s/p surgery for knee   Umbilical hernia    Weight decrease    Wrist fracture    Left wrist    Patient Active Problem List   Diagnosis Date Noted   Lumbar compression fracture (HCC) 06/19/2020   Colles' fracture of left radius, initial encounter for closed fracture 05/09/2020   Osteoarthritis of left hip 11/09/2017   S/P hip replacement, left 11/09/2017   Primary osteoarthritis of both hips 09/25/2016   Fibromyalgia 04/28/2016   Primary osteoarthritis of both knees 04/28/2016   History of total knee  replacement, bilateral 04/28/2016   Primary osteoarthritis of both hands 04/28/2016   Primary insomnia 04/28/2016   History of sleep apnea 04/28/2016   History of restless legs syndrome 04/28/2016   Osteopenia of multiple sites 04/28/2016   Trochanteric bursitis of left hip 02/03/2016   Hiatal hernia with gastroesophageal reflux 11/06/2015   Hiatal hernia 03/25/2015   Gastroesophageal reflux disease with esophagitis 01/17/2015   Ligamentous laxity of knee 01/05/2011   Knee effusion, right 11/10/2010   Knee pain 11/10/2010   Incisional hernia 10/16/2010   HYPERTENSION 05/14/2010   OBSTRUCTIVE SLEEP APNEA 05/14/2010   Obesity 04/08/2009    Past Surgical History:  Procedure Laterality Date   ANKLE SURGERY     APPENDECTOMY  2014   BALLOON DILATION N/A 06/05/2015   Procedure: BALLOON DILATION;  Surgeon: Willis Modena, MD;  Location: [facility];  Service: Endoscopy;  Laterality: N/A;   CATARACT EXTRACTION Bilateral 05/2013   CHOLECYSTECTOMY     DILATION AND CURETTAGE OF UTERUS     ESOPHAGOGASTRODUODENOSCOPY (EGD) WITH PROPOFOL N/A 06/05/2015   Procedure: ESOPHAGOGASTRODUODENOSCOPY (EGD) WITH PROPOFOL;  Surgeon: Willis Modena, MD;  Location: [facility];  Service: Endoscopy;  Laterality: N/A;   HERNIA REPAIR  05/2013   due to appendectomy with mesh   HIATAL HERNIA REPAIR N/A 11/06/2015   Procedure: LAPAROSCOPIC REPAIR HIATAL HERNIA, Nissen FUNDOPLICATION;  Surgeon: Claud Kelp, MD;  Location: [facility];  Service: General;  Laterality: N/A;   JOINT REPLACEMENT     KNEE ARTHROPLASTY Bilateral    knee infection Right    knee hardware removed 11/12/   replaced 1/13   TOTAL HIP ARTHROPLASTY Left 11/09/2017   Procedure: LEFT TOTAL HIP ARTHROPLASTY;  Surgeon: Valeria BatmanWhitfield, Lakresha Stifter W, MD;  Location: [facility];  Service: Orthopedics;  Laterality: Left;   VENTRAL HERNIA REPAIR  07/21/2011   Procedure: LAPAROSCOPIC VENTRAL HERNIA;  Surgeon: Ernestene MentionHaywood M Ingram, MD;  Location: [facility];  Service: General;   Laterality: N/A;  Laparoscopic Repair Verntal Incisional Hernia and Mesh     OB History     Gravida  2   Para  2   Term  2   Preterm  0   AB  0   Living  2      SAB  0   IAB  0   Ectopic  0   Multiple  0   Live Births  2           Family History  Problem Relation Age of Onset   Lymphoma Mother     Social History   Tobacco Use   Smoking status: Never   Smokeless tobacco: Never  Vaping Use   Vaping Use: Never used  Substance Use Topics   Alcohol use: Yes    Comment: Socially   Drug use: No    Home Medications Prior to Admission medications   Medication Sig Start Date End Date Taking? Authorizing Provider  amLODipine (NORVASC) 10 MG tablet Take 5 mg by mouth daily.    [provider]  aspirin 81 MG chewable tablet Chew 1 tablet (81 mg total) by mouth 2 (two) times daily. Patient taking differently: Chew 81 mg by mouth daily. 11/11/17   Jetty PeeksPetrarca, Brian D, PA-C  atorvastatin (LIPITOR) 10 MG tablet Take 10 mg by mouth daily.    [provider]  calcium carbonate (OSCAL) 1500 (600 Ca) MG TABS tablet  05/29/09   [provider]  CALCIUM PO Take by mouth.    [provider]  cholecalciferol (VITAMIN D3) 10 MCG (400 UNIT) TABS tablet Take 400 Units by mouth daily with breakfast.    [provider]  Coenzyme Q10 (CO Q-10) 200 MG CAPS Take 200 mg by mouth daily.    [provider]  CRANBERRY PO Take 2 tablets by mouth 2 (two) times daily.     [provider]  diclofenac Sodium (VOLTAREN) 1 % GEL APPLY 2 TO 4 GRAMS TO AFFECTED JOINT 4 TIMES DAILY AS NEEDED. 02/12/20   Pollyann Savoyeveshwar, Shaili, MD  DULoxetine (CYMBALTA) 30 MG capsule TAKE 1 CAPSULE (30 MG TOTAL) BY MOUTH AT BEDTIME. 05/26/20   Pollyann Savoyeveshwar, Shaili, MD  esomeprazole (NEXIUM) 40 MG capsule Take 40 mg by mouth daily. 02/06/20   [provider]  fexofenadine (ALLEGRA) 180 MG tablet Take 180 mg by mouth daily with breakfast.    [provider]  gabapentin (NEURONTIN) 300 MG capsule TAKE 1 CAPSULE TWICE DAILY 08/06/20   Gearldine Bienenstockale, Taylor M, PA-C  linaclotide [facility](LINZESS) 145 MCG CAPS capsule Take 145 mcg by mouth daily.    [provider]  Magnesium 500 MG TABS Take 1,000 mg by mouth 2 (two) times daily.     [provider]  Manganese 50 MG TABS Take 50 mg by mouth daily.    [provider]  Multiple Vitamins-Minerals (PRESERVISION AREDS 2) CAPS Take 1 capsule by mouth daily. 05/06/20  [provider]  naproxen sodium (ALEVE) 220 MG tablet Take 220 mg by mouth as needed.    [provider]  pantoprazole (PROTONIX) 40 MG tablet Take 40 mg by mouth daily. 04/17/20   [provider]  predniSONE (DELTASONE) 1 MG tablet Take 1 tablet (1 mg total) by mouth daily with breakfast. Take along with one 5mg  tablet. Follow taper as discussed. 07/30/20   08/01/20, MD  predniSONE (DELTASONE) 5 MG tablet Take 1 tablet (5 mg total) by mouth daily with breakfast. 06/05/20   06/07/20, MD  Probiotic Product (PROBIOTIC DAILY) CAPS Take 1 tablet by mouth daily.    [provider]  Thiamine HCl (VITAMIN B-1) 100 MG tablet Take 100 mg by mouth daily with breakfast.    [provider]  traMADol (ULTRAM) 50 MG tablet Take 1 tablet (50 mg total) by mouth every 6 (six) hours as needed. 06/19/20   08/19/20, MD  triamcinolone cream (KENALOG) 0.1 % Apply 1 application topically as needed.    [provider]    Allergies    Sudafed [pseudoephedrine hcl]  Review of Systems   Review of Systems  All other systems reviewed and are negative.  Physical Exam Updated Vital Signs BP 139/84   Pulse (!) 106   Resp 18   LMP 09/20/2000 (Exact Date)   SpO2 95%   Physical Exam Vitals and nursing note reviewed.  Constitutional:      General: She is not in acute distress.    Appearance: She is well-developed.  HENT:     Head: Normocephalic and atraumatic.  Eyes:      Conjunctiva/sclera: Conjunctivae normal.     Pupils: Pupils are equal, round, and reactive to light.  Cardiovascular:     Rate and Rhythm: Normal rate and regular rhythm.     Heart sounds: Normal heart sounds.  Pulmonary:     Effort: Pulmonary effort is normal. No respiratory distress.     Breath sounds: Normal breath sounds.  Abdominal:     General: There is no distension.     Palpations: Abdomen is soft.  Musculoskeletal:        General: No deformity. Normal range of motion.     Cervical back: Normal range of motion and neck supple.  Skin:    General: Skin is warm and dry.  Neurological:     Mental Status: She is alert and oriented to person, place, and time.    ED Results / Procedures / Treatments   Labs (all labs ordered are listed, but only abnormal results are displayed) Labs Reviewed  CBC WITH DIFFERENTIAL/PLATELET - Abnormal; Notable for the following components:      Result Value   WBC 22.0 (*)    RBC 5.76 (*)    Hemoglobin 17.8 (*)    HCT 53.6 (*)    Platelets 401 (*)    Neutro Abs 19.3 (*)    Monocytes Absolute 1.2 (*)    Abs Immature Granulocytes 0.25 (*)    All other components within normal limits  LACTIC ACID, PLASMA - Abnormal; Notable for the following components:   Lactic Acid, Venous 3.0 (*)    All other components within normal limits  LACTIC ACID, PLASMA  URINALYSIS, ROUTINE W REFLEX MICROSCOPIC  COMPREHENSIVE METABOLIC PANEL    EKG EKG Interpretation  Date/Time:  Thursday August 22 2020 13:26:36 EDT Ventricular Rate:  106 PR Interval:  160 QRS Duration: 75 QT Interval:  343 QTC Calculation: 456 R  Axis:   -12 Text Interpretation: Sinus tachycardia Ventricular premature complex Low voltage, precordial leads Consider anterior infarct Borderline repolarization abnormality Confirmed by Kristine Royal [phone]) on 08/22/2020 1:39:25 PM  Radiology No results found.  Procedures Procedures   Medications Ordered in ED Medications  sodium chloride  0.9 % bolus 1,000 mL (has no administration in time range)  sodium chloride 0.9 % bolus 1,000 mL (1,000 mLs Intravenous New Bag/Given 08/22/20 1307)  ondansetron (ZOFRAN) injection 4 mg (4 mg Intravenous Given 08/22/20 1309)    ED Course  I have reviewed the triage vital signs and the nursing notes.  Pertinent labs & imaging results that were available during my care of the patient were reviewed by me and considered in my medical decision making (see chart for details).  Clinical Course as of 08/25/20 1115  Thu Aug 22, 2020  1806 Urinalysis without signs of infection [JK]  1807 CT does suggest possible gastritis [JK]    Clinical Course User Index [JK] Linwood Dibbles, MD   MDM Rules/Calculators/A&P                          MDM  MSE complete  Miranda F Pore was evaluated in Emergency Department on 08/22/2020 for the symptoms described in the history of present illness. She was evaluated in the context of the global COVID-19 pandemic, which necessitated consideration that the patient might be at risk for infection with the SARS-CoV-2 virus that causes COVID-19. Institutional protocols and algorithms that pertain to the evaluation of patients at risk for COVID-19 are in a state of rapid change based on information released by regulatory bodies including the CDC and federal and state organizations. These policies and algorithms were followed during the patient's care in the ED.   Patient is presenting with complaint of significant numerous episodes of vomiting.  Will initiate IV fluid resuscitation.  Screening labs to be obtained.  CT imaging of the abdomen and pelvis ordered.  Labs demonstrate dehydration with an elevated lactic acid 2.9.  White count is 22.  Creatinine is 0.86.  CT imaging pending.  Case signed out to Dr. Lynelle Doctor.     Final Clinical Impression(s) / ED Diagnoses Final diagnoses:  Nausea and vomiting, intractability of vomiting not specified, unspecified vomiting  type  Acute gastritis without hemorrhage, unspecified gastritis type    Rx / DC Orders ED Discharge Orders          Ordered    ondansetron (ZOFRAN ODT) 8 MG disintegrating tablet  Every 8 hours PRN        08/22/20 2025             Wynetta Fines, MD 08/25/20 1116

## 2020-08-22 NOTE — ED Provider Notes (Signed)
Clinical Course as of 08/22/20 2021  Thu Aug 22, 2020  1806 Urinalysis without signs of infection [JK]  1807 CT does suggest possible gastritis [JK]    Clinical Course User Index [JK] Linwood Dibbles, MD   Patient's lipase came back and it is normal at this point.  Patient has been monitored in the ED and she is feeling much better.  She is able to drink fluids she has not had any further vomiting.  Patient's elevated lactic acid level likely related to dehydration.  Not seeing any signs of acute infection at this time.  CT scan does show findings of gastritis.  Patient states she is on Carafate as well as antacids.  She is scheduled to see her GI doctor.  Patient's heart rate is slightly elevated but is down to 110 at the bedside.  There was concerned about the patient's elevated white blood cell count but she states she has been on prednisone.  At this point she is feeling better and wants to go home.  She understands to return for any worsening symptoms.  Recommend close follow-up with PCP   Linwood Dibbles, MD 08/22/20 2023

## 2020-08-22 NOTE — ED Notes (Signed)
Pt understands and repeats follow up instructions. Understands where pharmacy to pick up meds. Assisted to wheelchair and taken to vehicle. No distress on departure.

## 2020-08-22 NOTE — ED Triage Notes (Signed)
Patient presents to the ED via EMS with C/O nausea and Vomiting that began about midnight.  Also C/O abdominal pain.  Denies blood in emesis.  Has not taken her meds today.

## 2020-08-24 ENCOUNTER — Encounter: Payer: Self-pay | Admitting: Orthopaedic Surgery

## 2020-08-29 ENCOUNTER — Ambulatory Visit: Payer: Medicare PPO | Admitting: Rheumatology

## 2020-08-29 DIAGNOSIS — R1110 Vomiting, unspecified: Secondary | ICD-10-CM | POA: Diagnosis not present

## 2020-09-01 ENCOUNTER — Other Ambulatory Visit: Payer: Self-pay | Admitting: Rheumatology

## 2020-09-02 NOTE — Telephone Encounter (Signed)
Attempted to contact the patient and left message for patient to call the office. Advised patient to let us know if she would like this sent to the local pharmacy or if there is another pharmacy she would like it sent to. Previous refill patient was going to be in [place].

## 2020-09-03 NOTE — Telephone Encounter (Signed)
Patient states she is still in town and needs her prescription sent to the local pharmacy.   Next Visit: 01/29/2021   Last Visit: 07/29/2020   Last Fill: 07/30/2020  Dx:  Polymyalgia rheumatica  Current Dose per office note on 07/29/2020: - prednisone 7mg  by mouth daily.   Okay to refill Prednisone?

## 2020-09-04 DIAGNOSIS — K3189 Other diseases of stomach and duodenum: Secondary | ICD-10-CM | POA: Diagnosis not present

## 2020-09-04 DIAGNOSIS — K319 Disease of stomach and duodenum, unspecified: Secondary | ICD-10-CM | POA: Diagnosis not present

## 2020-09-04 DIAGNOSIS — K449 Diaphragmatic hernia without obstruction or gangrene: Secondary | ICD-10-CM | POA: Diagnosis not present

## 2020-09-04 DIAGNOSIS — K2210 Ulcer of esophagus without bleeding: Secondary | ICD-10-CM | POA: Diagnosis not present

## 2020-09-04 DIAGNOSIS — K208 Other esophagitis without bleeding: Secondary | ICD-10-CM | POA: Diagnosis not present

## 2020-09-04 DIAGNOSIS — K2970 Gastritis, unspecified, without bleeding: Secondary | ICD-10-CM | POA: Diagnosis not present

## 2020-09-04 DIAGNOSIS — R12 Heartburn: Secondary | ICD-10-CM | POA: Diagnosis not present

## 2020-09-04 DIAGNOSIS — Z9889 Other specified postprocedural states: Secondary | ICD-10-CM | POA: Diagnosis not present

## 2020-09-04 DIAGNOSIS — R1310 Dysphagia, unspecified: Secondary | ICD-10-CM | POA: Diagnosis not present

## 2020-09-10 DIAGNOSIS — K319 Disease of stomach and duodenum, unspecified: Secondary | ICD-10-CM | POA: Diagnosis not present

## 2020-09-20 DIAGNOSIS — M353 Polymyalgia rheumatica: Secondary | ICD-10-CM | POA: Diagnosis not present

## 2020-09-20 DIAGNOSIS — M797 Fibromyalgia: Secondary | ICD-10-CM | POA: Diagnosis not present

## 2020-09-20 DIAGNOSIS — R251 Tremor, unspecified: Secondary | ICD-10-CM | POA: Diagnosis not present

## 2020-09-30 ENCOUNTER — Other Ambulatory Visit: Payer: Self-pay | Admitting: Physician Assistant

## 2020-09-30 NOTE — Telephone Encounter (Signed)
Next Visit: 01/29/2021   Last Visit: 07/29/2020   Last Fill: 09/03/2020  Dx: Polymyalgia rheumatica  Current Dose per office note on 07/29/2020: prednisone 7mg  by mouth daily  Per protocol, okay to refill per Dr. 

## 2020-10-03 ENCOUNTER — Other Ambulatory Visit: Payer: Self-pay | Admitting: Rheumatology

## 2020-10-03 NOTE — Telephone Encounter (Signed)
Next Visit: 01/29/2021   Last Visit: 07/29/2020   Last Fill: 02/12/2020  Okay to refill Diclofenac Gel?

## 2020-10-05 ENCOUNTER — Other Ambulatory Visit: Payer: Self-pay | Admitting: Rheumatology

## 2020-10-07 NOTE — Telephone Encounter (Signed)
Next Visit: 01/29/2021   Last Visit: 07/29/2020   Last Fill: 05/26/2020  Dx:  Fibromyalgia   Current Dose per office note on 07/29/2020: She is on combination of gabapentin and Cymbalta  Okay to refill Cymbalta?

## 2020-10-08 ENCOUNTER — Ambulatory Visit: Payer: Self-pay

## 2020-10-08 ENCOUNTER — Ambulatory Visit: Payer: Medicare PPO | Admitting: Orthopaedic Surgery

## 2020-10-08 ENCOUNTER — Encounter: Payer: Self-pay | Admitting: Orthopaedic Surgery

## 2020-10-08 VITALS — BP 128/83 | HR 84

## 2020-10-08 DIAGNOSIS — S32020A Wedge compression fracture of second lumbar vertebra, initial encounter for closed fracture: Secondary | ICD-10-CM | POA: Diagnosis not present

## 2020-10-08 NOTE — Progress Notes (Signed)
Office Visit Note   Patient: Miranda Romero           Date of Birth: 07/19/1945           MRN: [id number] Visit Date: 10/08/2020              Requested by: Gaspar Garbe, MD [address] [place],  [place] [address] PCP: Wylene Simmer Adelfa Koh, MD   Assessment & Plan: Visit Diagnoses:  1. Compression fracture of L2 vertebra, initial encounter [facility])     Plan: Patient has neurogenic claudication symptoms with palpable pulses.  Would recommend proceeding with an MRI scan lumbar spine to evaluate her for lumbar spinal stenosis.  She may have disc herniation adjacent to L2 compression fracture or may have some progression of L4-5 stenosis where she has some anterolisthesis.  Office follow-up after MRI scan.  X-rays reviewed previous CT scan abdomen done for nausea and vomiting abdominal pain in June reviewed which showed at that time 20% L2 compression fracture.  Follow-up after MRI scan.  Follow-Up Instructions: No follow-ups on file.   Orders:  Orders Placed This Encounter  Procedures   XR Lumbar Spine 2-3 Views   No orders of the defined types were placed in this encounter.     Procedures: No procedures performed   Clinical Data: No additional findings.   Subjective: Chief Complaint  Patient presents with   L2 compression fracture follow up    HPI 75 year old female previous bone density test showed osteopenia fell landing on her buttocks with L2 compression fracture 2 months ago.  She has been going to integrated therapy working on deep tissue work and states she continues to have problems if she tries to stand more than 5 to 10 minutes and has pain 10 out of 10 and has to sit down.  She gets immediate relief sitting she can only walk 100 to 200 feet.  She can ambulate further leaning on a grocery cart.  She had been on tramadol had some problems with tremors.  She states pain sometimes been 7-10 but this occurs when she has stood for period time or tried to  ambulate with claudication symptoms.  Patient had been on tramadol 60 tablets monthly for extended period of time.  She states it does not work like he used to and she states she was diagnosed with fibromyalgia for which she was placed on the tramadol.  Review of Systems   Objective: Vital Signs: BP 128/83   Pulse 84   LMP 09/20/2000 (Exact Date)   Physical Exam  Ortho Exam  Specialty Comments:  No specialty comments available.  Imaging: No results found.   PMFS History: Patient Active Problem List   Diagnosis Date Noted   Lumbar compression fracture (HCC) 06/19/2020   Colles' fracture of left radius, initial encounter for closed fracture 05/09/2020   Osteoarthritis of left hip 11/09/2017   S/P hip replacement, left 11/09/2017   Primary osteoarthritis of both hips 09/25/2016   Fibromyalgia 04/28/2016   Primary osteoarthritis of both knees 04/28/2016   History of total knee replacement, bilateral 04/28/2016   Primary osteoarthritis of both hands 04/28/2016   Primary insomnia 04/28/2016   History of sleep apnea 04/28/2016   History of restless legs syndrome 04/28/2016   Osteopenia of multiple sites 04/28/2016   Trochanteric bursitis of left hip 02/03/2016   Hiatal hernia with gastroesophageal reflux 11/06/2015   Hiatal hernia 03/25/2015   Gastroesophageal reflux disease with esophagitis 01/17/2015   Ligamentous laxity  of knee 01/05/2011   Knee effusion, right 11/10/2010   Knee pain 11/10/2010   Incisional hernia 10/16/2010   HYPERTENSION 05/14/2010   OBSTRUCTIVE SLEEP APNEA 05/14/2010   Obesity 04/08/2009   Past Medical History:  Diagnosis Date   Arthritis    Blood transfusion    Compression fracture of L3 vertebra (HCC)    Fibromyalgia    Dr Corliss Skains   GERD (gastroesophageal reflux disease)    Hiatal hernia with gastroesophageal reflux 11/06/2015   History of hiatal hernia    Hyperlipidemia    Hypertension    Dr Wylene Simmer LOV 4/13 on chart   Seasonal  allergies    Sleep apnea    SEVERE  per sleep study 3/12 EPIC no CPAP    Transfusion history    3 yrs ago s/p surgery for knee   Umbilical hernia    Weight decrease    Wrist fracture    Left wrist    Family History  Problem Relation Age of Onset   Lymphoma Mother     Past Surgical History:  Procedure Laterality Date   ANKLE SURGERY     APPENDECTOMY  2014   BALLOON DILATION N/A 06/05/2015   Procedure: Marvis Repress DILATION;  Surgeon: Willis Modena, MD;  Location: [facility];  Service: Endoscopy;  Laterality: N/A;   CATARACT EXTRACTION Bilateral 05/2013   CHOLECYSTECTOMY     DILATION AND CURETTAGE OF UTERUS     ESOPHAGOGASTRODUODENOSCOPY (EGD) WITH PROPOFOL N/A 06/05/2015   Procedure: ESOPHAGOGASTRODUODENOSCOPY (EGD) WITH PROPOFOL;  Surgeon: Willis Modena, MD;  [facility];  Service: Endoscopy;  Laterality: N/A;   HERNIA REPAIR  05/2013   due to appendectomy with mesh   HIATAL HERNIA REPAIR N/A 11/06/2015   Procedure: LAPAROSCOPIC REPAIR HIATAL HERNIA, Nissen FUNDOPLICATION;  Surgeon: Claud Kelp, MD;  Location: [facility];  Service: General;  Laterality: N/A;   JOINT REPLACEMENT     KNEE ARTHROPLASTY Bilateral    knee infection Right    knee hardware removed 11/12/   replaced 1/13   TOTAL HIP ARTHROPLASTY Left 11/09/2017   Procedure: LEFT TOTAL HIP ARTHROPLASTY;  Surgeon: Valeria Batman, MD;  Location: [facility];  Service: Orthopedics;  Laterality: Left;   VENTRAL HERNIA REPAIR  07/21/2011   Procedure: LAPAROSCOPIC VENTRAL HERNIA;  Surgeon: Ernestene Mention, MD;  Location: [facility];  Service: General;  Laterality: N/A;  Laparoscopic Repair Verntal Incisional Hernia and Mesh   Social History   Occupational History   Occupation: Doctor, general practice Retired  Tobacco Use   Smoking status: Never   Smokeless tobacco: Never  Vaping Use   Vaping Use: Never used  Substance and Sexual Activity   Alcohol use: Yes    Comment: Socially   Drug use: No   Sexual activity: Yes     Partners: Male    Birth control/protection: Post-menopausal

## 2020-10-21 ENCOUNTER — Encounter: Payer: Self-pay | Admitting: Orthopaedic Surgery

## 2020-10-21 MED ORDER — TRAMADOL HCL 50 MG PO TABS: 50.0000 mg | ORAL_TABLET | Freq: Four times a day (QID) | ORAL | 0 refills | Status: DC | PRN

## 2020-10-27 ENCOUNTER — Ambulatory Visit
Admission: RE | Admit: 2020-10-27 | Discharge: 2020-10-27 | Disposition: A | Discharge status: Home / Self Care | Payer: Medicare PPO | Source: Ambulatory Visit | Attending: Orthopaedic Surgery | Admitting: Orthopaedic Surgery

## 2020-10-27 DIAGNOSIS — M545 Low back pain, unspecified: Secondary | ICD-10-CM | POA: Diagnosis not present

## 2020-10-27 DIAGNOSIS — S32020A Wedge compression fracture of second lumbar vertebra, initial encounter for closed fracture: Secondary | ICD-10-CM

## 2020-10-27 DIAGNOSIS — M48061 Spinal stenosis, lumbar region without neurogenic claudication: Secondary | ICD-10-CM | POA: Diagnosis not present

## 2020-10-30 DIAGNOSIS — E7800 Pure hypercholesterolemia, unspecified: Secondary | ICD-10-CM | POA: Diagnosis not present

## 2020-10-30 DIAGNOSIS — M353 Polymyalgia rheumatica: Secondary | ICD-10-CM | POA: Diagnosis not present

## 2020-10-30 DIAGNOSIS — N182 Chronic kidney disease, stage 2 (mild): Secondary | ICD-10-CM | POA: Diagnosis not present

## 2020-10-30 DIAGNOSIS — I129 Hypertensive chronic kidney disease with stage 1 through stage 4 chronic kidney disease, or unspecified chronic kidney disease: Secondary | ICD-10-CM | POA: Diagnosis not present

## 2020-10-30 DIAGNOSIS — R7301 Impaired fasting glucose: Secondary | ICD-10-CM | POA: Diagnosis not present

## 2020-10-30 DIAGNOSIS — M797 Fibromyalgia: Secondary | ICD-10-CM | POA: Diagnosis not present

## 2020-10-30 DIAGNOSIS — R251 Tremor, unspecified: Secondary | ICD-10-CM | POA: Diagnosis not present

## 2020-10-30 DIAGNOSIS — M545 Low back pain, unspecified: Secondary | ICD-10-CM | POA: Diagnosis not present

## 2020-11-04 ENCOUNTER — Encounter: Payer: Self-pay | Admitting: Rheumatology

## 2020-11-05 ENCOUNTER — Other Ambulatory Visit: Payer: Self-pay

## 2020-11-05 ENCOUNTER — Encounter: Payer: Self-pay | Admitting: Orthopaedic Surgery

## 2020-11-05 ENCOUNTER — Ambulatory Visit: Payer: Medicare PPO | Admitting: Orthopaedic Surgery

## 2020-11-05 VITALS — BP 119/78 | HR 94 | Ht 65.0 in | Wt 220.0 lb

## 2020-11-05 DIAGNOSIS — S32020A Wedge compression fracture of second lumbar vertebra, initial encounter for closed fracture: Secondary | ICD-10-CM

## 2020-11-06 NOTE — Progress Notes (Signed)
Office Visit Note   Patient: Miranda Romero           Date of Birth: May 03, 1945           MRN: [id number] Visit Date: 11/05/2020              Requested by: Gaspar Garbe, MD [address] [place],  [place] [address] PCP: Wylene Simmer Adelfa Koh, MD   Assessment & Plan: Visit Diagnoses:  1. Compression fracture of L2 vertebra, initial encounter [facility])     Plan: MRI scan is reviewed.  We discussed diagnosis spinal stenosis.  Bone density test was normal she does have the endplate fractures at L2.  She has increased claudication symptoms she can return to discuss surgical options.  Follow-Up Instructions: No follow-ups on file.   Orders:  No orders of the defined types were placed in this encounter.  No orders of the defined types were placed in this encounter.     Procedures: No procedures performed   Clinical Data: No additional findings.   Subjective: Chief Complaint  Patient presents with   Lower Back - Pain, Follow-up    MRI lumbar review    HPI 75 year old female returns with a remote endplate fracture superior L2 endplate and inferior endplate fracture of L1.  Showed moderate spinal lateral recess stenosis at L1-2 and moderate to severe spinal stenosis and lateral recess stenosis left greater than right at L4-5.  She continues to have pain she took tramadol and Tylenol with not a lot of relief.  TENS unit helped and integrative therapy also diclofenac gel helped.  She has been Press photographer with a cane.  She has had some arm pain with her polymyalgia rheumatica and is in a decreasing prednisone taper has noticed some increase in her shoulder and arm pain as she tapers the prednisone.  Bone density test has been normal x2.  Previous left wrist fracture doing well.  Review of Systems 14 point system updated noncontributory HPI.   Objective: Vital Signs: BP 119/78   Pulse 94   Ht 5\' 5"  (1.651 m)   Wt 220 lb (99.8 kg)   LMP 09/20/2000 (Exact Date)   BMI 36.61 kg/m    Physical Exam Constitutional:      Appearance: She is well-developed.  HENT:     Head: Normocephalic.     Right Ear: External ear normal.     Left Ear: External ear normal. There is no impacted cerumen.  Eyes:     Pupils: Pupils are equal, round, and reactive to light.  Neck:     Thyroid: No thyromegaly.     Trachea: No tracheal deviation.  Cardiovascular:     Rate and Rhythm: Normal rate.  Pulmonary:     Effort: Pulmonary effort is normal.  Abdominal:     Palpations: Abdomen is soft.  Musculoskeletal:     Cervical back: No rigidity.  Skin:    General: Skin is warm and dry.  Neurological:     Mental Status: She is alert and oriented to person, place, and time.  Psychiatric:        Behavior: Behavior normal.    Ortho Exam quads are strong negative logroll the hips.  Well-healed right left total knee arthroplasty incisions.  Anterior tib gastrocsoleus is intact.  Distal pulses palpable.  Specialty Comments:  No specialty comments available.  Imaging: CLINICAL DATA:  Severe back pain since a fall in February of 2022.   EXAM: MRI LUMBAR SPINE WITHOUT CONTRAST  TECHNIQUE: Multiplanar, multisequence MR imaging of the lumbar spine was performed. No intravenous contrast was administered.   COMPARISON:  Lumbar radiographs from 06/19/2020 and CT scan from 08/22/2020   FINDINGS: Segmentation: There are five lumbar type vertebral bodies. The last full intervertebral disc space is labeled L5-S1. This correlates with the CT scan.   Alignment: Normal overall alignment. Mild degenerative anterolisthesis of L4 is noted due to facet disease.   Vertebrae: Remote superior endplate compression fracture of L2 slightly greater than 50%. Mild retropulsion involving the posterosuperior aspect of the vertebral body contributing to spinal stenosis at L1-2. Mild inferior endplate irregularity and slight compression deformity of L1, also remote. The other lumbar vertebral bodies  are intact. No acute fractures or worrisome bone lesions.   Conus medullaris and cauda equina: Conus extends to the L1 level. Conus and cauda equina appear normal.   Paraspinal and other soft tissues: No significant paraspinal or retroperitoneal findings. Small scattered renal cysts are noted. No retroperitoneal mass or adenopathy. The SI joints appear normal and the sacrum is intact.   Disc levels:   T12-L1: No significant findings. Mild facet disease.   L1-2: Moderate spinal and bilateral lateral recess stenosis due to the retropulsion at L2, short pedicles, facet disease and ligamentum flavum thickening. There is also a component of epidural lipomatosis.   L2-3: Moderate degenerate disc disease with a bulging degenerated annulus and mild osteophytic ridging. There is also mild to moderate facet disease but no focal disc protrusion, significant spinal or foraminal stenosis.   L3-4: Bulging annulus and moderate facet disease but no significant spinal stenosis. There is mild to moderate right and mild left lateral recess stenosis and mild left foraminal encroachment.   L4-5: Bulging uncovered disc, severe facet disease with ligamentum flavum thickening and buckling. This is more pronounced on the left. Findings contribute to moderate to moderately severe spinal stenosis and bilateral lateral recess stenosis, left greater than right. Is also mild left foraminal stenosis.   L5-S1: Moderate facet disease but no disc protrusions, spinal or foraminal stenosis.   IMPRESSION: 1. Remote superior endplate compression fracture of L2 and inferior endplate compression fracture of L1. No acute fractures. 2. Moderate spinal and bilateral lateral recess stenosis at L1-2. 3. Mild to moderate right and mild left lateral recess stenosis and mild left foraminal encroachment at L3-4. 4. Moderate to moderately severe spinal stenosis and bilateral lateral recess stenosis, left greater than  right at L4-5. There is also mild left foraminal stenosis.     Electronically Signed   By: Rudie Meyer M.D.   On: 10/28/2020 13:38   PMFS History: Patient Active Problem List   Diagnosis Date Noted   Lumbar compression fracture (HCC) 06/19/2020   Colles' fracture of left radius, initial encounter for closed fracture 05/09/2020   Osteoarthritis of left hip 11/09/2017   S/P hip replacement, left 11/09/2017   Primary osteoarthritis of both hips 09/25/2016   Fibromyalgia 04/28/2016   Primary osteoarthritis of both knees 04/28/2016   History of total knee replacement, bilateral 04/28/2016   Primary osteoarthritis of both hands 04/28/2016   Primary insomnia 04/28/2016   History of sleep apnea 04/28/2016   History of restless legs syndrome 04/28/2016   Osteopenia of multiple sites 04/28/2016   Trochanteric bursitis of left hip 02/03/2016   Hiatal hernia with gastroesophageal reflux 11/06/2015   Hiatal hernia 03/25/2015   Gastroesophageal reflux disease with esophagitis 01/17/2015   Ligamentous laxity of knee 01/05/2011   Knee effusion, right 11/10/2010  Knee pain 11/10/2010   Incisional hernia 10/16/2010   HYPERTENSION 05/14/2010   OBSTRUCTIVE SLEEP APNEA 05/14/2010   Obesity 04/08/2009   Past Medical History:  Diagnosis Date   Arthritis    Blood transfusion    Compression fracture of L3 vertebra (HCC)    Fibromyalgia    Dr Corliss Skains   GERD (gastroesophageal reflux disease)    Hiatal hernia with gastroesophageal reflux 11/06/2015   History of hiatal hernia    Hyperlipidemia    Hypertension    Dr Wylene Simmer LOV 4/13 on chart   Seasonal allergies    Sleep apnea    SEVERE  per sleep study 3/12 EPIC no CPAP    Transfusion history    3 yrs ago s/p surgery for knee   Umbilical hernia    Weight decrease    Wrist fracture    Left wrist    Family History  Problem Relation Age of Onset   Lymphoma Mother     Past Surgical History:  Procedure Laterality Date   ANKLE  SURGERY     APPENDECTOMY  2014   BALLOON DILATION N/A 06/05/2015   Procedure: Marvis Repress DILATION;  Surgeon: Willis Modena, MD;  Location: [facility];  Service: Endoscopy;  Laterality: N/A;   CATARACT EXTRACTION Bilateral 05/2013   CHOLECYSTECTOMY     DILATION AND CURETTAGE OF UTERUS     ESOPHAGOGASTRODUODENOSCOPY (EGD) WITH PROPOFOL N/A 06/05/2015   Procedure: ESOPHAGOGASTRODUODENOSCOPY (EGD) WITH PROPOFOL;  Surgeon: Willis Modena, MD;  [facility]: [facility];  Service: Endoscopy;  Laterality: N/A;   HERNIA REPAIR  05/2013   due to appendectomy with mesh   HIATAL HERNIA REPAIR N/A 11/06/2015   Procedure: LAPAROSCOPIC REPAIR HIATAL HERNIA, Nissen FUNDOPLICATION;  Surgeon: Claud Kelp, MD;  Location: [facility];  Service: General;  Laterality: N/A;   JOINT REPLACEMENT     KNEE ARTHROPLASTY Bilateral    knee infection Right    knee hardware removed 11/12/   replaced 1/13   TOTAL HIP ARTHROPLASTY Left 11/09/2017   Procedure: LEFT TOTAL HIP ARTHROPLASTY;  Surgeon: Valeria Batman, MD;  Location: [facility];  Service: Orthopedics;  Laterality: Left;   VENTRAL HERNIA REPAIR  07/21/2011   Procedure: LAPAROSCOPIC VENTRAL HERNIA;  Surgeon: Ernestene Mention, MD;  Location: [facility];  Service: General;  Laterality: N/A;  Laparoscopic Repair Verntal Incisional Hernia and Mesh   Social History   Occupational History   Occupation: Doctor, general practice Retired  Tobacco Use   Smoking status: Never   Smokeless tobacco: Never  Vaping Use   Vaping Use: Never used  Substance and Sexual Activity   Alcohol use: Yes    Comment: Socially   Drug use: No   Sexual activity: Yes    Partners: Male    Birth control/protection: Post-menopausal

## 2020-11-11 DIAGNOSIS — M5136 Other intervertebral disc degeneration, lumbar region: Secondary | ICD-10-CM | POA: Diagnosis not present

## 2020-11-11 DIAGNOSIS — S32020S Wedge compression fracture of second lumbar vertebra, sequela: Secondary | ICD-10-CM | POA: Diagnosis not present

## 2020-11-11 DIAGNOSIS — M47816 Spondylosis without myelopathy or radiculopathy, lumbar region: Secondary | ICD-10-CM | POA: Diagnosis not present

## 2020-11-11 DIAGNOSIS — M4856XA Collapsed vertebra, not elsewhere classified, lumbar region, initial encounter for fracture: Secondary | ICD-10-CM | POA: Diagnosis not present

## 2020-11-13 ENCOUNTER — Other Ambulatory Visit: Payer: Self-pay | Admitting: Rheumatology

## 2020-11-13 MED ORDER — PREDNISONE 1 MG PO TABS: 3.0000 mg | ORAL_TABLET | Freq: Every day | ORAL | 0 refills | Status: DC

## 2020-11-13 NOTE — Telephone Encounter (Addendum)
Next Visit: 01/29/2021   Last Visit: 07/29/2020   Last Fill: 09/30/2020  Dx:  Polymyalgia rheumatica    Current Dose per office note on 07/29/2020:prednisone 7mg  by mouth daily  Spoke with patient's husband and she is current on 4 mg and due to start 3 mg tomorrow for 1 month.   Okay to refill Prednisone?

## 2020-11-14 DIAGNOSIS — M47816 Spondylosis without myelopathy or radiculopathy, lumbar region: Secondary | ICD-10-CM | POA: Diagnosis not present

## 2020-11-14 DIAGNOSIS — S32020S Wedge compression fracture of second lumbar vertebra, sequela: Secondary | ICD-10-CM | POA: Diagnosis not present

## 2020-11-19 DIAGNOSIS — S32020S Wedge compression fracture of second lumbar vertebra, sequela: Secondary | ICD-10-CM | POA: Diagnosis not present

## 2020-11-19 DIAGNOSIS — M47816 Spondylosis without myelopathy or radiculopathy, lumbar region: Secondary | ICD-10-CM | POA: Diagnosis not present

## 2020-11-25 DIAGNOSIS — S32020S Wedge compression fracture of second lumbar vertebra, sequela: Secondary | ICD-10-CM | POA: Diagnosis not present

## 2020-11-25 DIAGNOSIS — M47816 Spondylosis without myelopathy or radiculopathy, lumbar region: Secondary | ICD-10-CM | POA: Diagnosis not present

## 2020-11-26 DIAGNOSIS — I10 Essential (primary) hypertension: Secondary | ICD-10-CM | POA: Diagnosis not present

## 2020-11-26 DIAGNOSIS — Z7982 Long term (current) use of aspirin: Secondary | ICD-10-CM | POA: Diagnosis not present

## 2020-11-26 DIAGNOSIS — K219 Gastro-esophageal reflux disease without esophagitis: Secondary | ICD-10-CM | POA: Diagnosis not present

## 2020-11-26 DIAGNOSIS — Z7952 Long term (current) use of systemic steroids: Secondary | ICD-10-CM | POA: Diagnosis not present

## 2020-11-26 DIAGNOSIS — M47816 Spondylosis without myelopathy or radiculopathy, lumbar region: Secondary | ICD-10-CM | POA: Diagnosis not present

## 2020-11-26 DIAGNOSIS — M797 Fibromyalgia: Secondary | ICD-10-CM | POA: Diagnosis not present

## 2020-11-26 DIAGNOSIS — E782 Mixed hyperlipidemia: Secondary | ICD-10-CM | POA: Diagnosis not present

## 2020-11-26 DIAGNOSIS — M810 Age-related osteoporosis without current pathological fracture: Secondary | ICD-10-CM | POA: Diagnosis not present

## 2020-11-26 DIAGNOSIS — G4733 Obstructive sleep apnea (adult) (pediatric): Secondary | ICD-10-CM | POA: Diagnosis not present

## 2020-11-29 DIAGNOSIS — K209 Esophagitis, unspecified without bleeding: Secondary | ICD-10-CM | POA: Diagnosis not present

## 2020-11-29 DIAGNOSIS — E876 Hypokalemia: Secondary | ICD-10-CM | POA: Diagnosis not present

## 2020-12-02 DIAGNOSIS — H40013 Open angle with borderline findings, low risk, bilateral: Secondary | ICD-10-CM | POA: Diagnosis not present

## 2020-12-05 DIAGNOSIS — K449 Diaphragmatic hernia without obstruction or gangrene: Secondary | ICD-10-CM | POA: Diagnosis not present

## 2020-12-05 DIAGNOSIS — K219 Gastro-esophageal reflux disease without esophagitis: Secondary | ICD-10-CM | POA: Diagnosis not present

## 2020-12-05 DIAGNOSIS — K2970 Gastritis, unspecified, without bleeding: Secondary | ICD-10-CM | POA: Diagnosis not present

## 2020-12-12 ENCOUNTER — Other Ambulatory Visit: Payer: Self-pay | Admitting: Physician Assistant

## 2020-12-12 NOTE — Telephone Encounter (Signed)
Next Visit: 01/29/2021   Last Visit: 07/29/2020   Last Fill: 11/13/2020   Dx:  Polymyalgia rheumatica    Current Dose per office note on 07/29/2020:prednisone 7mg  by mouth daily   Spoke with patient's husband and she is currently on 3 mg and due to start 2 mg  12/14/2020 for 1 month.    Okay to refill Prednisone?

## 2020-12-16 DIAGNOSIS — S32020S Wedge compression fracture of second lumbar vertebra, sequela: Secondary | ICD-10-CM | POA: Diagnosis not present

## 2020-12-16 DIAGNOSIS — M47816 Spondylosis without myelopathy or radiculopathy, lumbar region: Secondary | ICD-10-CM | POA: Diagnosis not present

## 2020-12-18 DIAGNOSIS — M47816 Spondylosis without myelopathy or radiculopathy, lumbar region: Secondary | ICD-10-CM | POA: Diagnosis not present

## 2020-12-18 DIAGNOSIS — M9904 Segmental and somatic dysfunction of sacral region: Secondary | ICD-10-CM | POA: Diagnosis not present

## 2020-12-19 DIAGNOSIS — S32020S Wedge compression fracture of second lumbar vertebra, sequela: Secondary | ICD-10-CM | POA: Diagnosis not present

## 2020-12-19 DIAGNOSIS — M47816 Spondylosis without myelopathy or radiculopathy, lumbar region: Secondary | ICD-10-CM | POA: Diagnosis not present

## 2020-12-23 DIAGNOSIS — M47816 Spondylosis without myelopathy or radiculopathy, lumbar region: Secondary | ICD-10-CM | POA: Diagnosis not present

## 2020-12-23 DIAGNOSIS — S32020S Wedge compression fracture of second lumbar vertebra, sequela: Secondary | ICD-10-CM | POA: Diagnosis not present

## 2020-12-26 DIAGNOSIS — M47816 Spondylosis without myelopathy or radiculopathy, lumbar region: Secondary | ICD-10-CM | POA: Diagnosis not present

## 2020-12-26 DIAGNOSIS — S32020S Wedge compression fracture of second lumbar vertebra, sequela: Secondary | ICD-10-CM | POA: Diagnosis not present

## 2021-01-07 DIAGNOSIS — M47816 Spondylosis without myelopathy or radiculopathy, lumbar region: Secondary | ICD-10-CM | POA: Diagnosis not present

## 2021-01-07 DIAGNOSIS — S32020S Wedge compression fracture of second lumbar vertebra, sequela: Secondary | ICD-10-CM | POA: Diagnosis not present

## 2021-01-10 DIAGNOSIS — M4856XA Collapsed vertebra, not elsewhere classified, lumbar region, initial encounter for fracture: Secondary | ICD-10-CM | POA: Diagnosis not present

## 2021-01-10 DIAGNOSIS — S32020S Wedge compression fracture of second lumbar vertebra, sequela: Secondary | ICD-10-CM | POA: Diagnosis not present

## 2021-01-10 DIAGNOSIS — M9904 Segmental and somatic dysfunction of sacral region: Secondary | ICD-10-CM | POA: Diagnosis not present

## 2021-01-10 DIAGNOSIS — M47816 Spondylosis without myelopathy or radiculopathy, lumbar region: Secondary | ICD-10-CM | POA: Diagnosis not present

## 2021-01-14 DIAGNOSIS — Z79899 Other long term (current) drug therapy: Secondary | ICD-10-CM | POA: Diagnosis not present

## 2021-01-14 DIAGNOSIS — E782 Mixed hyperlipidemia: Secondary | ICD-10-CM | POA: Diagnosis not present

## 2021-01-14 DIAGNOSIS — K219 Gastro-esophageal reflux disease without esophagitis: Secondary | ICD-10-CM | POA: Diagnosis not present

## 2021-01-14 DIAGNOSIS — M9904 Segmental and somatic dysfunction of sacral region: Secondary | ICD-10-CM | POA: Diagnosis not present

## 2021-01-14 DIAGNOSIS — M810 Age-related osteoporosis without current pathological fracture: Secondary | ICD-10-CM | POA: Diagnosis not present

## 2021-01-14 DIAGNOSIS — I10 Essential (primary) hypertension: Secondary | ICD-10-CM | POA: Diagnosis not present

## 2021-01-14 DIAGNOSIS — M797 Fibromyalgia: Secondary | ICD-10-CM | POA: Diagnosis not present

## 2021-01-14 DIAGNOSIS — M1990 Unspecified osteoarthritis, unspecified site: Secondary | ICD-10-CM | POA: Diagnosis not present

## 2021-01-14 DIAGNOSIS — G4733 Obstructive sleep apnea (adult) (pediatric): Secondary | ICD-10-CM | POA: Diagnosis not present

## 2021-01-15 NOTE — Progress Notes (Signed)
Office Visit Note  Patient: Miranda Romero             Date of Birth: 01-28-1946           MRN: [id number]             PCP: Haywood Pao, MD Referring: Haywood Pao, MD Visit Date: 01/29/2021 Occupation: @GUAROCC @  Subjective:  Pain in multiple joints.   History of Present Illness: Miranda Romero is a 75 y.o. female with a history of osteoarthritis, fibromyalgia and polymyalgia rheumatica.  She states she has been having a lot of reflux symptoms.  She was seen in the emergency room about 4 months ago due to severe reflux.  She states she required IV fluids and medications for nausea.  She has had endoscopies since then and was diagnosed with severe gastritis.  She has been taking Nexium.  She also was experiencing tremors for which she was seen by Dr. Osborne Casco.  He took her off several supplements and medications and the tremors have been better.  She has been also experiencing more of lower back pain and discomfort.  She was evaluated at pain management clinic in [place] and where she had L4-L5 joint injection.  It gave her some relief.  She had another injection about 3 weeks ago.  She has been tapering prednisone gradually and she is taking prednisone 1 mg p.o. daily.  Without any increased muscular weakness or tenderness.  Activities of Daily Living:  Patient reports morning stiffness for 2 hours.   Patient Reports nocturnal pain.  Difficulty dressing/grooming: Denies Difficulty climbing stairs: Reports Difficulty getting out of chair: Reports Difficulty using hands for taps, buttons, cutlery, and/or writing: Denies  Review of Systems  Constitutional:  Positive for fatigue.  HENT:  Negative for mouth sores, mouth dryness and nose dryness.   Eyes:  Negative for pain, itching and dryness.  Respiratory:  Negative for shortness of breath and difficulty breathing.   Cardiovascular:  Negative for chest pain and palpitations.  Gastrointestinal:  Negative for blood in  stool, constipation and diarrhea.  Endocrine: Negative for increased urination.  Genitourinary:  Negative for difficulty urinating.  Musculoskeletal:  Positive for joint pain, joint pain, myalgias, morning stiffness, muscle tenderness and myalgias. Negative for joint swelling.  Skin:  Negative for color change, rash, redness and sensitivity to sunlight.  Allergic/Immunologic: Negative for susceptible to infections.  Neurological:  Positive for memory loss. Negative for dizziness, numbness and headaches.  Hematological:  Positive for bruising/bleeding tendency. Negative for swollen glands.  Psychiatric/Behavioral:  Negative for confusion.    PMFS History:  Patient Active Problem List   Diagnosis Date Noted   Lumbar compression fracture ([place]) 06/19/2020   Colles' fracture of left radius, initial encounter for closed fracture 05/09/2020   Osteoarthritis of left hip 11/09/2017   S/P hip replacement, left 11/09/2017   Primary osteoarthritis of both hips 09/25/2016   Fibromyalgia 04/28/2016   Primary osteoarthritis of both knees 04/28/2016   History of total knee replacement, bilateral 04/28/2016   Primary osteoarthritis of both hands 04/28/2016   Primary insomnia 04/28/2016   History of sleep apnea 04/28/2016   History of restless legs syndrome 04/28/2016   Osteopenia of multiple sites 04/28/2016   Trochanteric bursitis of left hip 02/03/2016   Hiatal hernia with gastroesophageal reflux 11/06/2015   Hiatal hernia 03/25/2015   Gastroesophageal reflux disease with esophagitis 01/17/2015   Ligamentous laxity of knee 01/05/2011   Knee effusion, right 11/10/2010   Knee pain  11/10/2010   Incisional hernia 10/16/2010   HYPERTENSION 05/14/2010   OBSTRUCTIVE SLEEP APNEA 05/14/2010   Obesity 04/08/2009    Past Medical History:  Diagnosis Date   Arthritis    Blood transfusion    Compression fracture of L3 vertebra (HCC)    Fibromyalgia    Dr Corliss Skains   GERD (gastroesophageal reflux  disease)    Hiatal hernia with gastroesophageal reflux 11/06/2015   History of hiatal hernia    Hyperlipidemia    Hypertension    Dr Wylene Simmer LOV 4/13 on chart   Seasonal allergies    Sleep apnea    SEVERE  per sleep study 3/12 EPIC no CPAP    Transfusion history    3 yrs ago s/p surgery for knee   Umbilical hernia    Weight decrease    Wrist fracture    Left wrist    Family History  Problem Relation Age of Onset   Lymphoma Mother    Past Surgical History:  Procedure Laterality Date   ANKLE SURGERY     APPENDECTOMY  2014   BALLOON DILATION N/A 06/05/2015   Procedure: Marvis Repress DILATION;  Surgeon: Willis Modena, MD;  [facility];  Service: Endoscopy;  Laterality: N/A;   CATARACT EXTRACTION Bilateral 05/2013   CHOLECYSTECTOMY     DILATION AND CURETTAGE OF UTERUS     ESOPHAGOGASTRODUODENOSCOPY (EGD) WITH PROPOFOL N/A 06/05/2015   Procedure: ESOPHAGOGASTRODUODENOSCOPY (EGD) WITH PROPOFOL;  Surgeon: Willis Modena, MD;  [facility];  Service: Endoscopy;  Laterality: N/A;   HERNIA REPAIR  05/2013   due to appendectomy with mesh   HIATAL HERNIA REPAIR N/A 11/06/2015   Procedure: LAPAROSCOPIC REPAIR HIATAL HERNIA, Nissen FUNDOPLICATION;  Surgeon: Claud Kelp, MD;  [facility];  Service: General;  Laterality: N/A;   JOINT REPLACEMENT     KNEE ARTHROPLASTY Bilateral    knee infection Right    knee hardware removed 11/12/   replaced 1/13   spine injection     [facility]   TOTAL HIP ARTHROPLASTY Left 11/09/2017   Procedure: LEFT TOTAL HIP ARTHROPLASTY;  Surgeon: Valeria Batman, MD;  [facility]: [facility];  Service: Orthopedics;  Laterality: Left;   VENTRAL HERNIA REPAIR  07/21/2011   Procedure: LAPAROSCOPIC VENTRAL HERNIA;  Surgeon: Ernestene Mention, MD;  [facility];  Service: General;  Laterality: N/A;  Laparoscopic Repair Verntal Incisional Hernia and Mesh   Social History   Social History Narrative   Lives at home with her husband   They  have second home in [place] where they spend a lot of time   Right handed   Caffeine: 2 cups daily   Immunization History  Administered Date(s) Administered   Influenza Whole 11/18/2009   Influenza, High Dose Seasonal PF 01/05/2017, 12/09/2017   Influenza-Unspecified 11/29/2010, 01/18/2015, 11/30/2015   PFIZER(Purple Top)SARS-COV-2 Vaccination 04/22/2019, 05/17/2019, 11/18/2019, 07/25/2020   Pfizer Covid-19 Vaccine Bivalent Booster 38yrs & up 12/09/2020   Pneumococcal Polysaccharide-23 11/29/2007, 04/16/2008   Tdap 04/21/2018   Zoster Recombinat (Shingrix) 10/18/2017     Objective: Vital Signs: BP 135/87 (BP Location: Left Arm, Patient Position: Sitting, Cuff Size: Large)   Pulse 82   Ht 5\' 5"  (1.651 m)   Wt 230 lb 3.2 oz (104.4 kg)   LMP 09/20/2000 (Exact Date)   BMI 38.31 kg/m    Physical Exam Vitals and nursing note reviewed.  Constitutional:      Appearance: She is well-developed.  HENT:     Head: Normocephalic and atraumatic.  Eyes:  Conjunctiva/sclera: Conjunctivae normal.  Cardiovascular:     Rate and Rhythm: Normal rate and regular rhythm.     Heart sounds: Normal heart sounds.  Pulmonary:     Effort: Pulmonary effort is normal.     Breath sounds: Normal breath sounds.  Abdominal:     General: Bowel sounds are normal.     Palpations: Abdomen is soft.  Musculoskeletal:     Cervical back: Normal range of motion.  Lymphadenopathy:     Cervical: No cervical adenopathy.  Skin:    General: Skin is warm and dry.     Capillary Refill: Capillary refill takes less than 2 seconds.  Neurological:     Mental Status: She is alert and oriented to person, place, and time.  Psychiatric:        Behavior: Behavior normal.     Musculoskeletal Exam: C-spine was in good range of motion.  Shoulder joints, elbow joints, wrist joints with good range of motion.  She had mild DIP and PIP thickening but no synovitis was noted.  Hip joints and knee joints with good range of motion.   Her left hip joint and bilateral knee joints are replaced.  He continues to have some generalized pain and hyperalgesia.  She mobilizes with the help of a cane.  CDAI Exam: CDAI Score: -- Patient Global: --; Provider Global: -- Swollen: --; Tender: -- Joint Exam 01/29/2021   No joint exam has been documented for this visit   There is currently no information documented on the homunculus. Go to the Rheumatology activity and complete the homunculus joint exam.  Investigation: No additional findings.  Imaging: No results found.  Recent Labs: Lab Results  Component Value Date   WBC 22.0 (H) 08/22/2020   HGB 17.8 (H) 08/22/2020   PLT 401 (H) 08/22/2020   NA 138 08/22/2020   K 5.1 08/22/2020   CL 107 08/22/2020   CO2 21 (L) 08/22/2020   GLUCOSE 146 (H) 08/22/2020   BUN 24 (H) 08/22/2020   CREATININE 0.86 08/22/2020   BILITOT 1.6 (H) 08/22/2020   ALKPHOS 46 08/22/2020   AST 96 (H) 08/22/2020   ALT 24 08/22/2020   PROT 4.8 (L) 08/22/2020   ALBUMIN 2.4 (L) 08/22/2020   CALCIUM 7.4 (L) 08/22/2020   GFRAA >60 11/11/2017   QFTBGOLDPLUS NEGATIVE 04/09/2020    Speciality Comments: No specialty comments available.  Procedures:  No procedures performed Allergies: Sudafed [pseudoephedrine hcl]   Assessment / Plan:     Visit Diagnoses: Polymyalgia rheumatica (HCC)-she has noticed increased muscular weakness or tenderness.  She had no difficulty raising her arms while getting up from the chair.  She has been tapering prednisone and is currently on prednisone 1 mg p.o. daily.  She will come off prednisone over the next month.  She was advised to call me in case she develops any new symptoms.  Trochanteric bursitis of both hips-she has mild tenderness.  IT band stretches were discussed.  Primary osteoarthritis of both hands-she continues to have some pain and stiffness in her hands.  Joint protection muscle strengthening was discussed.  Closed Colles' fracture of left radius,  sequela - Dr. Lorin Mercy  Status post total hip replacement, left-she had good range of motion without discomfort.  History of total knee replacement, bilateral-she had good range of motion of bilateral knee joints.  She continues to have some discomfort in her left knee joint.  DDD lumbar spine-she was experiencing severe pain and discomfort in her lower back since her vertebral  fractures.  She states she went to pain management at [facility] clinic where she had L4-L5 facet joint injection x2.  She had great relief from the cortisone injection.  She is also going to physical therapy which has been helpful.  Core strengthening exercises were emphasized today.  Water aerobics and water exercises were also discussed.  She is going to [place] and will be back in January.  Closed compression fracture of L2 vertebra, sequela  Osteopenia of multiple sites - DEXA 04/18/18 BMD 0.809 with T-score -1.1.  I reviewed her bone density from April 29, 2020 which showed a T score of -1.2 in the right total hip.  Calcium rich diet and vitamin D was discussed.  Fibromyalgia-she continues to have some generalized pain and discomfort.  She had multiple tender points.  Primary insomnia-she states that insomnia persist.  With sleep hygiene was discussed.  History of hypertension-blood pressure was normal today.  History of sleep apnea  History of restless legs syndrome  History of gastroesophageal reflux (GERD)-her reflux is still a major concern.  She states that she has severe reflux.  Despite of being on Nexium she has early morning cough and nausea.  She is followed by gastroenterology.  She states that she would not be able to get surgery for her hiatal hernia due to scarring from previous surgery.  Orders: No orders of the defined types were placed in this encounter.  No orders of the defined types were placed in this encounter.    Follow-Up Instructions: Return in about 5 months (around 06/29/2021) for PMR,  OA, FMS.   Bo Merino, MD  Note - This record has been created using Editor, commissioning.  Chart creation errors have been sought, but may not always  have been located. Such creation errors do not reflect on  the standard of medical care.

## 2021-01-29 ENCOUNTER — Other Ambulatory Visit: Payer: Self-pay

## 2021-01-29 ENCOUNTER — Encounter: Payer: Self-pay | Admitting: Rheumatology

## 2021-01-29 ENCOUNTER — Ambulatory Visit: Payer: Medicare PPO | Admitting: Rheumatology

## 2021-01-29 VITALS — BP 135/87 | HR 82 | Ht 65.0 in | Wt 230.2 lb

## 2021-01-29 DIAGNOSIS — Z8679 Personal history of other diseases of the circulatory system: Secondary | ICD-10-CM

## 2021-01-29 DIAGNOSIS — S52532S Colles' fracture of left radius, sequela: Secondary | ICD-10-CM

## 2021-01-29 DIAGNOSIS — M19042 Primary osteoarthritis, left hand: Secondary | ICD-10-CM

## 2021-01-29 DIAGNOSIS — M353 Polymyalgia rheumatica: Secondary | ICD-10-CM | POA: Diagnosis not present

## 2021-01-29 DIAGNOSIS — S32020S Wedge compression fracture of second lumbar vertebra, sequela: Secondary | ICD-10-CM

## 2021-01-29 DIAGNOSIS — Z79899 Other long term (current) drug therapy: Secondary | ICD-10-CM

## 2021-01-29 DIAGNOSIS — M7062 Trochanteric bursitis, left hip: Secondary | ICD-10-CM

## 2021-01-29 DIAGNOSIS — M19041 Primary osteoarthritis, right hand: Secondary | ICD-10-CM

## 2021-01-29 DIAGNOSIS — Z96642 Presence of left artificial hip joint: Secondary | ICD-10-CM

## 2021-01-29 DIAGNOSIS — M51369 Other intervertebral disc degeneration, lumbar region without mention of lumbar back pain or lower extremity pain: Secondary | ICD-10-CM

## 2021-01-29 DIAGNOSIS — M7061 Trochanteric bursitis, right hip: Secondary | ICD-10-CM

## 2021-01-29 DIAGNOSIS — M5136 Other intervertebral disc degeneration, lumbar region: Secondary | ICD-10-CM | POA: Diagnosis not present

## 2021-01-29 DIAGNOSIS — Z96653 Presence of artificial knee joint, bilateral: Secondary | ICD-10-CM

## 2021-01-29 DIAGNOSIS — F5101 Primary insomnia: Secondary | ICD-10-CM

## 2021-01-29 DIAGNOSIS — M797 Fibromyalgia: Secondary | ICD-10-CM

## 2021-01-29 DIAGNOSIS — M8589 Other specified disorders of bone density and structure, multiple sites: Secondary | ICD-10-CM | POA: Diagnosis not present

## 2021-01-29 DIAGNOSIS — Z8669 Personal history of other diseases of the nervous system and sense organs: Secondary | ICD-10-CM

## 2021-01-29 DIAGNOSIS — Z8719 Personal history of other diseases of the digestive system: Secondary | ICD-10-CM

## 2021-02-12 DIAGNOSIS — M47816 Spondylosis without myelopathy or radiculopathy, lumbar region: Secondary | ICD-10-CM | POA: Diagnosis not present

## 2021-02-12 DIAGNOSIS — M48061 Spinal stenosis, lumbar region without neurogenic claudication: Secondary | ICD-10-CM | POA: Diagnosis not present

## 2021-02-12 DIAGNOSIS — M9904 Segmental and somatic dysfunction of sacral region: Secondary | ICD-10-CM | POA: Diagnosis not present

## 2021-02-18 DIAGNOSIS — J111 Influenza due to unidentified influenza virus with other respiratory manifestations: Secondary | ICD-10-CM | POA: Diagnosis not present

## 2021-02-18 DIAGNOSIS — M791 Myalgia, unspecified site: Secondary | ICD-10-CM | POA: Diagnosis not present

## 2021-02-28 DIAGNOSIS — R0602 Shortness of breath: Secondary | ICD-10-CM | POA: Diagnosis not present

## 2021-02-28 DIAGNOSIS — J189 Pneumonia, unspecified organism: Secondary | ICD-10-CM | POA: Diagnosis not present

## 2021-02-28 DIAGNOSIS — R051 Acute cough: Secondary | ICD-10-CM | POA: Diagnosis not present

## 2021-02-28 DIAGNOSIS — R059 Cough, unspecified: Secondary | ICD-10-CM | POA: Diagnosis not present

## 2021-03-19 DIAGNOSIS — Z1152 Encounter for screening for COVID-19: Secondary | ICD-10-CM | POA: Diagnosis not present

## 2021-03-19 DIAGNOSIS — R5383 Other fatigue: Secondary | ICD-10-CM | POA: Diagnosis not present

## 2021-03-19 DIAGNOSIS — J302 Other seasonal allergic rhinitis: Secondary | ICD-10-CM | POA: Diagnosis not present

## 2021-03-19 DIAGNOSIS — R0981 Nasal congestion: Secondary | ICD-10-CM | POA: Diagnosis not present

## 2021-03-19 DIAGNOSIS — R051 Acute cough: Secondary | ICD-10-CM | POA: Diagnosis not present

## 2021-03-19 DIAGNOSIS — R609 Edema, unspecified: Secondary | ICD-10-CM | POA: Diagnosis not present

## 2021-03-24 ENCOUNTER — Telehealth: Payer: Self-pay | Admitting: Rheumatology

## 2021-03-24 DIAGNOSIS — M5136 Other intervertebral disc degeneration, lumbar region: Secondary | ICD-10-CM

## 2021-03-24 NOTE — Telephone Encounter (Signed)
Okay to refer to physical therapy.  Please ask her if she would like to be referred to integrative therapies for fibromyalgia or other physical therapy site  for degenerative disc disease and fall prevention.  Please send the referral accordingly.

## 2021-03-24 NOTE — Telephone Encounter (Signed)
Attempted to contact the patient and left message for patient to call the office.  

## 2021-03-24 NOTE — Telephone Encounter (Signed)
Patient called the office stating she would like a referral to physical therapy. Patient states she had seen a PT back in [place] but would like to see one locally. Patient states she discussed this with Dr. Estanislado Pandy.

## 2021-03-25 NOTE — Telephone Encounter (Signed)
Spoke with patient and she states she would like to see PT for degenerative disc disease and fall prevention. Patient states she is going to speak with her friend and see who she sees. Patient will call back to let us know where to refer her too.

## 2021-03-26 DIAGNOSIS — L814 Other melanin hyperpigmentation: Secondary | ICD-10-CM | POA: Diagnosis not present

## 2021-03-26 DIAGNOSIS — L304 Erythema intertrigo: Secondary | ICD-10-CM | POA: Diagnosis not present

## 2021-03-26 DIAGNOSIS — Z85828 Personal history of other malignant neoplasm of skin: Secondary | ICD-10-CM | POA: Diagnosis not present

## 2021-03-26 DIAGNOSIS — L821 Other seborrheic keratosis: Secondary | ICD-10-CM | POA: Diagnosis not present

## 2021-03-26 DIAGNOSIS — L720 Epidermal cyst: Secondary | ICD-10-CM | POA: Diagnosis not present

## 2021-03-26 NOTE — Telephone Encounter (Signed)
Patient returned call to the office and states she would like to be referred to the [facility] at [place].   Left message to advise patient the referral has been placed.

## 2021-03-26 NOTE — Addendum Note (Signed)
Addended by: Carole Binning on: 03/26/2021 03:45 PM   Modules accepted: Orders

## 2021-04-01 ENCOUNTER — Other Ambulatory Visit: Payer: Self-pay

## 2021-04-01 ENCOUNTER — Ambulatory Visit: Payer: Medicare PPO | Attending: Rheumatology | Admitting: Rehabilitative and Restorative Service Providers"

## 2021-04-01 ENCOUNTER — Encounter: Payer: Self-pay | Admitting: Rehabilitative and Restorative Service Providers"

## 2021-04-01 DIAGNOSIS — M85851 Other specified disorders of bone density and structure, right thigh: Secondary | ICD-10-CM | POA: Diagnosis not present

## 2021-04-01 DIAGNOSIS — M5136 Other intervertebral disc degeneration, lumbar region: Secondary | ICD-10-CM | POA: Diagnosis not present

## 2021-04-01 DIAGNOSIS — R262 Difficulty in walking, not elsewhere classified: Secondary | ICD-10-CM | POA: Insufficient documentation

## 2021-04-01 DIAGNOSIS — Z9181 History of falling: Secondary | ICD-10-CM | POA: Insufficient documentation

## 2021-04-01 DIAGNOSIS — M6281 Muscle weakness (generalized): Secondary | ICD-10-CM | POA: Diagnosis not present

## 2021-04-01 DIAGNOSIS — M4856XD Collapsed vertebra, not elsewhere classified, lumbar region, subsequent encounter for fracture with routine healing: Secondary | ICD-10-CM | POA: Insufficient documentation

## 2021-04-01 DIAGNOSIS — M62830 Muscle spasm of back: Secondary | ICD-10-CM | POA: Insufficient documentation

## 2021-04-01 DIAGNOSIS — M797 Fibromyalgia: Secondary | ICD-10-CM | POA: Insufficient documentation

## 2021-04-01 DIAGNOSIS — Z96653 Presence of artificial knee joint, bilateral: Secondary | ICD-10-CM | POA: Insufficient documentation

## 2021-04-01 NOTE — Patient Instructions (Signed)
Access Code: [email or web address] URL: [email or web address] Date: 04/01/2021 Prepared by: Clydie Braun [place]  Exercises Supine Lower Trunk Rotation - 1-2 x daily - 7 x weekly - 1 sets - 5 reps - 10 sec hold Supine Piriformis Stretch with Foot on Ground - 1-2 x daily - 7 x weekly - 1 sets - 3 reps - 20 sec hold Supine Single Knee to Chest Stretch - 1-2 x daily - 7 x weekly - 1 sets - 3 reps - 20 sec hold Supine Transversus Abdominis Bracing - Hands on Stomach - 1-2 x daily - 7 x weekly - 2 sets - 10 reps - 5 sec hold Supine Hip Adduction Isometric with Ball - 1 x daily - 7 x weekly - 2 sets - 10 reps - 5 sec hold Hooklying Clamshell with Resistance - 1 x daily - 7 x weekly - 2 sets - 10 reps

## 2021-04-01 NOTE — Therapy (Signed)
[facility] [place], [place], [address] Phone: [phone]   Fax:  [phone]  Physical Therapy Evaluation  Patient Details  Name: Miranda Romero MRN: [id number] Date of Birth: 01-Jun-1945 Referring Provider (PT): Dr Estanislado Pandy   Encounter Date: 04/01/2021   PT End of Session - 04/01/21 1531     Visit Number 1    Date for PT Re-Evaluation 06/20/21    Authorization Type [employer]    PT Start Time [id number]    PT Stop Time 1530    PT Time Calculation (min) 45 min    Activity Tolerance Patient tolerated treatment well    Behavior During Therapy [facility] for tasks assessed/performed             Past Medical History:  Diagnosis Date   Arthritis    Blood transfusion    Compression fracture of L3 vertebra (HCC)    Fibromyalgia    Dr Estanislado Pandy   GERD (gastroesophageal reflux disease)    Hiatal hernia with gastroesophageal reflux 11/06/2015   History of hiatal hernia    Hyperlipidemia    Hypertension    Dr Osborne Casco LOV 4/13 on chart   Seasonal allergies    Sleep apnea    SEVERE  per sleep study 3/12 EPIC no CPAP    Transfusion history    3 yrs ago s/p surgery for knee   Umbilical hernia    Weight decrease    Wrist fracture    Left wrist    Past Surgical History:  Procedure Laterality Date   ANKLE SURGERY     APPENDECTOMY  2014   BALLOON DILATION N/A 06/05/2015   Procedure: BALLOON DILATION;  Surgeon: Arta Silence, MD;  [facility];  Service: Endoscopy;  Laterality: N/A;   CATARACT EXTRACTION Bilateral 05/2013   CHOLECYSTECTOMY     DILATION AND CURETTAGE OF UTERUS     ESOPHAGOGASTRODUODENOSCOPY (EGD) WITH PROPOFOL N/A 06/05/2015   Procedure: ESOPHAGOGASTRODUODENOSCOPY (EGD) WITH PROPOFOL;  Surgeon: Arta Silence, MD;  [facility];  Service: Endoscopy;  Laterality: N/A;   HERNIA REPAIR  05/2013   due to appendectomy with mesh   HIATAL HERNIA REPAIR N/A 11/06/2015    Procedure: LAPAROSCOPIC REPAIR HIATAL HERNIA, Nissen FUNDOPLICATION;  Surgeon: Fanny Skates, MD;  [facility]: [facility];  Service: General;  Laterality: N/A;   JOINT REPLACEMENT     KNEE ARTHROPLASTY Bilateral    knee infection Right    knee hardware removed 11/12/   replaced 1/13   spine injection     [place] Left 11/09/2017   Procedure: LEFT TOTAL HIP ARTHROPLASTY;  Surgeon: Garald Balding, MD;  Location: [place];  Service: Orthopedics;  Laterality: Left;   VENTRAL HERNIA REPAIR  07/21/2011   Procedure: LAPAROSCOPIC VENTRAL HERNIA;  Surgeon: Adin Hector, MD;  [facility];  Service: General;  Laterality: N/A;  Laparoscopic Repair Verntal Incisional Hernia and Mesh    There were no vitals filed for this visit.    Subjective Assessment - 04/01/21 1450     Subjective Pt reports that she fell in Feb 2022 and sustained closed compression Fx of L2.  She states that she is trying to avoid surgery.  She has a second home in [place] and she is following the [facility] for pain management and has been to PT in [place]. Pt is having increased pain since and wants to work with PT to allow her to be able  to function in life with decreased pain and improved mobility. Pt reports that her husband got her a 4WRW, and that has helped her some.    Pertinent History B TKA, Fibromyalgia, closed compression Fx of L2, osteopenia with DEXA 04/29/20  T-score of -1.2 of R hip.    Limitations Standing;Walking    How long can you stand comfortably? 10 min    How long can you walk comfortably? 10 min    Patient Stated Goals To relieve the pain in some way and help get better mobility.    Currently in Pain? Yes    Pain Score 9     Pain Location Back    Pain Orientation Lower    Pain Descriptors / Indicators Sharp    Pain Type Chronic pain    Pain Onset More than a month ago    Pain Frequency Intermittent    Aggravating Factors  standing, walking                 [facility] PT Assessment - 04/01/21 0001       Assessment   Medical Diagnosis M51.36 (ICD-10-CM) - DDD (degenerative disc disease), lumbar  Fall prevention    Referring Provider (PT) Dr Estanislado Pandy    Hand Dominance Right    Next MD Visit Dr Osborne Casco in Feb 2023, Dr Estanislado Pandy in April 2023, Pain Management at [facility] in June 2023    Prior Therapy yes, in [place]      Precautions   Precautions Fall      Restrictions   Weight Bearing Restrictions No      Balance Screen   Has the patient fallen in the past 6 months No    Has the patient had a decrease in activity level because of a fear of falling?  Yes    Is the patient reluctant to leave their home because of a fear of falling?  Yes      [place] residence    Living Arrangements Spouse/significant other    Available Help at Discharge Family    Type of [place] - 4 wheels;Shower seat;Grab bars - tub/shower      Prior Function   Level of Independence Independent    Vocation Retired    Marketing executive, traveling, shopping      Cognition   Overall Cognitive Status Within Functional Limits for tasks assessed      Observation/Other Assessments   Focus on Therapeutic Outcomes (FOTO)  46%   Predicted 52% by visit 13     Posture/Postural Control   Posture/Postural Control Postural limitations    Postural Limitations Rounded Shoulders;Forward head      ROM / Strength   AROM / PROM / Strength Strength      Strength   Overall Strength Comments BLE strenth of 4/5 grossly throughout      Flexibility   Soft Tissue Assessment /Muscle Length yes    Hamstrings tightness bilat.    Piriformis tightness bilat.      Transfers   Five time sit to stand comments  18.5 sec with UE pushing up from thighs      Standardized Balance Assessment   Standardized Balance Assessment Timed Up and Go Test      Timed Up and Go Test   TUG Normal TUG     Normal TUG (seconds) 13.1   with SPC  Objective measurements completed on examination: See above findings.                PT Education - 04/01/21 1521     Education Details Pt provided with HEP/review from [place].    Person(s) Educated Patient    Methods Explanation;Handout    Comprehension Verbalized understanding              PT Short Term Goals - 04/01/21 1542       PT SHORT TERM GOAL #1   Title Pt will be independent with initial HEP.    Time 3    Period Weeks    Status New               PT Long Term Goals - 04/01/21 1543       PT LONG TERM GOAL #1   Title Pt will be independent with advanced HEP.    Time 12    Period Weeks    Status New      PT LONG TERM GOAL #2   Title Pt will increase FOTO to at least 52% to demonstrate improved functional mobility.    Time 12    Period Weeks    Status New      PT LONG TERM GOAL #3   Title Pt will be able to ambulate for greater than 20 minutes with LRAD to allow her to go shopping at a small store.    Time 12    Period Weeks    Status New      PT LONG TERM GOAL #4   Title Pt will increase BLE strength to at least 4+/5 to allow her to negotiate steps without difficulty.    Time 12    Period Weeks    Status New      PT LONG TERM GOAL #5   Title Pt will have BERG of at least 48/56 to place her at a lower risk of falling.    Time 12    Period Weeks    Status New                    Plan - 04/01/21 1533     Clinical Impression Statement Pt is a 76 y.o. female referred to outpatient PT by Dr Bo Merino secondary to a diagnosis of DDD of lumbar spine and needing fall prevention education. Pt reports that her PLOF before February 2022 was able to travel, drive, and walk without assistive device. Pt reports that in Feb 2022, she fell and sustained a closed compression fracture and since that time, she has had increased pain and decreased mobility. Pt  reports after a lumbar injection in the past, she felt better and she overall has more mobility than when she was initially injured, but she is unable to stand or  walk for more than 10 minutes. Pt presents with increased back pain, bilateral leg weakness, difficulty walking, and decreased balance. Pt would benefit from skilled PT to address her functional impairments to allow her to be able to return to traveling and going out shopping.    Personal Factors and Comorbidities Age;Comorbidity 3+    Comorbidities B TKA, Fibromyalgia, closed compression Fx of L2, osteopenia with DEXA 04/29/20  T-score of -1.2 of R hip.    Examination-Activity Limitations Bathing;Locomotion Level;Transfers;Squat;Stairs;Stand    Examination-Participation Restrictions Community Activity;Driving    Stability/Clinical Decision Making Evolving/Moderate complexity    Clinical Decision Making Moderate    Rehab Potential Good  PT Frequency 2x / week    PT Duration 12 weeks    PT Treatment/Interventions ADLs/Self Care Home Management;Aquatic Therapy;Cryotherapy;Electrical Stimulation;Iontophoresis 4mg /ml Dexamethasone;Moist Heat;Traction;Ultrasound;Gait training;Stair training;Functional mobility training;Therapeutic activities;Therapeutic exercise;Balance training;Neuromuscular re-education;Patient/family education;Manual techniques;Passive range of motion;Dry needling;Taping;Spinal Manipulations;Joint Manipulations    PT Next Visit Plan assess and progress HEP, strengthening, core stability    PT Home Exercise Plan Access Code: [email or web address]    Consulted and Agree with Plan of Care Patient             Patient will benefit from skilled therapeutic intervention in order to improve the following deficits and impairments:  Difficulty walking, Increased muscle spasms, Pain, Impaired flexibility, Decreased balance, Decreased strength, Postural dysfunction  Visit Diagnosis: Difficulty in walking, not elsewhere classified - Plan: PT  plan of care cert/re-cert  Muscle weakness (generalized) - Plan: PT plan of care cert/re-cert  Muscle spasm of back - Plan: PT plan of care cert/re-cert     Problem List Patient Active Problem List   Diagnosis Date Noted   Lumbar compression fracture ([place]) 06/19/2020   Colles' fracture of left radius, initial encounter for closed fracture 05/09/2020   Osteoarthritis of left hip 11/09/2017   S/P hip replacement, left 11/09/2017   Primary osteoarthritis of both hips 09/25/2016   Fibromyalgia 04/28/2016   Primary osteoarthritis of both knees 04/28/2016   History of total knee replacement, bilateral 04/28/2016   Primary osteoarthritis of both hands 04/28/2016   Primary insomnia 04/28/2016   History of sleep apnea 04/28/2016   History of restless legs syndrome 04/28/2016   Osteopenia of multiple sites 04/28/2016   Trochanteric bursitis of left hip 02/03/2016   Hiatal hernia with gastroesophageal reflux 11/06/2015   Hiatal hernia 03/25/2015   Gastroesophageal reflux disease with esophagitis 01/17/2015   Ligamentous laxity of knee 01/05/2011   Knee effusion, right 11/10/2010   Knee pain 11/10/2010   Incisional hernia 10/16/2010   HYPERTENSION 05/14/2010   OBSTRUCTIVE SLEEP APNEA 05/14/2010   Obesity 04/08/2009    Juel Burrow, PT, DPT 04/01/2021, 3:48 PM  [facility] [place], [place], [address] Phone: [phone]   Fax:  [phone]  Name: Miranda Romero MRN: [id number] Date of Birth: 1945-11-21

## 2021-04-03 ENCOUNTER — Other Ambulatory Visit: Payer: Self-pay

## 2021-04-03 ENCOUNTER — Ambulatory Visit: Payer: Medicare PPO

## 2021-04-03 DIAGNOSIS — M62830 Muscle spasm of back: Secondary | ICD-10-CM

## 2021-04-03 DIAGNOSIS — M797 Fibromyalgia: Secondary | ICD-10-CM | POA: Diagnosis not present

## 2021-04-03 DIAGNOSIS — M6281 Muscle weakness (generalized): Secondary | ICD-10-CM

## 2021-04-03 DIAGNOSIS — R262 Difficulty in walking, not elsewhere classified: Secondary | ICD-10-CM | POA: Diagnosis not present

## 2021-04-03 DIAGNOSIS — M4856XD Collapsed vertebra, not elsewhere classified, lumbar region, subsequent encounter for fracture with routine healing: Secondary | ICD-10-CM | POA: Diagnosis not present

## 2021-04-03 DIAGNOSIS — Z96653 Presence of artificial knee joint, bilateral: Secondary | ICD-10-CM | POA: Diagnosis not present

## 2021-04-03 DIAGNOSIS — M85851 Other specified disorders of bone density and structure, right thigh: Secondary | ICD-10-CM | POA: Diagnosis not present

## 2021-04-03 DIAGNOSIS — Z9181 History of falling: Secondary | ICD-10-CM | POA: Diagnosis not present

## 2021-04-03 DIAGNOSIS — M5136 Other intervertebral disc degeneration, lumbar region: Secondary | ICD-10-CM | POA: Diagnosis not present

## 2021-04-03 NOTE — Therapy (Signed)
[facility] @ [facility] [address] [place], [place], [address] Phone: [phone]   Fax:  [phone]  Physical Therapy Treatment  Patient Details  Name: Miranda Romero MRN: [id number] Date of Birth: [birth year] Referring Provider (PT): Dr Corliss Skains   Encounter Date: 04/03/2021   PT End of Session - 04/03/21 1110     Visit Number 2    Date for PT Re-Evaluation 06/20/21    Authorization Type [employer]    Authorization Time Period 12 visits approved 1/17-06/20/21    Authorization - Visit Number 2    Authorization - Number of Visits 12    Progress Note Due on Visit 10    PT Start Time 1017    PT Stop Time 1102    PT Time Calculation (min) 45 min    Activity Tolerance Patient tolerated treatment well    Behavior During Therapy [facility] for tasks assessed/performed             Past Medical History:  Diagnosis Date   Arthritis    Blood transfusion    Compression fracture of L3 vertebra (HCC)    Fibromyalgia    Dr Corliss Skains   GERD (gastroesophageal reflux disease)    Hiatal hernia with gastroesophageal reflux 11/06/2015   History of hiatal hernia    Hyperlipidemia    Hypertension    Dr Wylene Simmer LOV 4/13 on chart   Seasonal allergies    Sleep apnea    SEVERE  per sleep study 3/12 EPIC no CPAP    Transfusion history    3 yrs ago s/p surgery for knee   Umbilical hernia    Weight decrease    Wrist fracture    Left wrist    Past Surgical History:  Procedure Laterality Date   ANKLE SURGERY     APPENDECTOMY  2014   BALLOON DILATION N/A 06/05/2015   Procedure: BALLOON DILATION;  Surgeon: Willis Modena, MD;  [facility];  Service: Endoscopy;  Laterality: N/A;   CATARACT EXTRACTION Bilateral 05/2013   CHOLECYSTECTOMY     DILATION AND CURETTAGE OF UTERUS     ESOPHAGOGASTRODUODENOSCOPY (EGD) WITH PROPOFOL N/A 06/05/2015   Procedure: ESOPHAGOGASTRODUODENOSCOPY (EGD) WITH PROPOFOL;  Surgeon: Willis Modena, MD;   [facility];  Service: Endoscopy;  Laterality: N/A;   HERNIA REPAIR  05/2013   due to appendectomy with mesh   HIATAL HERNIA REPAIR N/A 11/06/2015   Procedure: LAPAROSCOPIC REPAIR HIATAL HERNIA, Nissen FUNDOPLICATION;  Surgeon: Claud Kelp, MD;  [facility];  Service: General;  Laterality: N/A;   JOINT REPLACEMENT     KNEE ARTHROPLASTY Bilateral    knee infection Right    knee hardware removed 11/12/   replaced 1/13   spine injection     [facility]   TOTAL HIP ARTHROPLASTY Left 11/09/2017   Procedure: LEFT TOTAL HIP ARTHROPLASTY;  Surgeon: Valeria Batman, MD;  [facility];  Service: Orthopedics;  Laterality: Left;   VENTRAL HERNIA REPAIR  07/21/2011   Procedure: LAPAROSCOPIC VENTRAL HERNIA;  Surgeon: Ernestene Mention, MD;  [facility];  Service: General;  Laterality: N/A;  Laparoscopic Repair Verntal Incisional Hernia and Mesh    There were no vitals filed for this visit.   Subjective Assessment - 04/03/21 1019     Subjective I had a long drive to get here. I'm doing OK today.    Currently in Pain? Yes    Pain Score 7    knees and shoulders  [facility] Adult PT Treatment/Exercise - 04/03/21 0001       Exercises   Exercises Lumbar;Knee/Hip      Lumbar Exercises: Stretches   Active Hamstring Stretch Left;Right;3 reps;20 seconds    Piriformis Stretch 3 reps;20 seconds      Lumbar Exercises: Aerobic   Nustep Level 4x 6 min-PT present to discuss progress      Knee/Hip Exercises: Seated   Sit to Sand with UE support;10 reps      Manual Therapy   Manual Therapy Soft tissue mobilization;Myofascial release    Manual therapy comments skilled palpation and monitoring with DN today    Soft tissue mobilization elongation and release to bil lumbar paraspinals and proximal gluteals              Trigger Point Dry Needling - 04/03/21 0001     Consent Given? Yes    Education Handout Provided Yes     Muscles Treated Back/Hip Gluteus minimus;Gluteus medius;Piriformis;Lumbar multifidi    Gluteus Minimus Response Twitch response elicited;Palpable increased muscle length    Gluteus Medius Response Twitch response elicited;Palpable increased muscle length    Piriformis Response Twitch response elicited;Palpable increased muscle length    Lumbar multifidi Response Twitch response elicited;Palpable increased muscle length                   PT Education - 04/03/21 1031     Education Details DN, Access Code: [email or web address]    Person(s) Educated Patient    Methods Explanation;Demonstration;Handout    Comprehension Verbalized understanding;Returned demonstration              PT Short Term Goals - 04/01/21 1542       PT SHORT TERM GOAL #1   Title Pt will be independent with initial HEP.    Time 3    Period Weeks    Status New               PT Long Term Goals - 04/01/21 1543       PT LONG TERM GOAL #1   Title Pt will be independent with advanced HEP.    Time 12    Period Weeks    Status New      PT LONG TERM GOAL #2   Title Pt will increase FOTO to at least 52% to demonstrate improved functional mobility.    Time 12    Period Weeks    Status New      PT LONG TERM GOAL #3   Title Pt will be able to ambulate for greater than 20 minutes with LRAD to allow her to go shopping at a small store.    Time 12    Period Weeks    Status New      PT LONG TERM GOAL #4   Title Pt will increase BLE strength to at least 4+/5 to allow her to negotiate steps without difficulty.    Time 12    Period Weeks    Status New      PT LONG TERM GOAL #5   Title Pt will have BERG of at least 48/56 to place her at a lower risk of falling.    Time 12    Period Weeks    Status New                   Plan - 04/03/21 1109     Clinical Impression Statement First time follow-up after evaluation.  Pt was active in PT  prior to this in [place]. Pt was receptive to try DN as she  has not tried this before.  PT added to HEP to include seated flexibility and sit to stand.  Pt had good response to DN and manual therapy with improved elongation and report of improved mobility after session.  Pt will continue to benefit from skilled PT to address strength, flexibility and tissue mobility to improve function and endurance.    PT Frequency 2x / week    PT Duration 12 weeks    PT Treatment/Interventions ADLs/Self Care Home Management;Aquatic Therapy;Cryotherapy;Electrical Stimulation;Iontophoresis 4mg /ml Dexamethasone;Moist Heat;Traction;Ultrasound;Gait training;Stair training;Functional mobility training;Therapeutic activities;Therapeutic exercise;Balance training;Neuromuscular re-education;Patient/family education;Manual techniques;Passive range of motion;Dry needling;Taping;Spinal Manipulations;Joint Manipulations    PT Next Visit Plan assess and progress HEP, strengthening, core stability    PT Home Exercise Plan Access Code: [email or web address]    Recommended Other Services initial cert is signed    Consulted and Agree with Plan of Care Patient             Patient will benefit from skilled therapeutic intervention in order to improve the following deficits and impairments:  Difficulty walking, Increased muscle spasms, Pain, Impaired flexibility, Decreased balance, Decreased strength, Postural dysfunction  Visit Diagnosis: Difficulty in walking, not elsewhere classified  Muscle weakness (generalized)  Muscle spasm of back     Problem List Patient Active Problem List   Diagnosis Date Noted   Lumbar compression fracture (HCC) 06/19/2020   Colles' fracture of left radius, initial encounter for closed fracture 05/09/2020   Osteoarthritis of left hip 11/09/2017   S/P hip replacement, left 11/09/2017   Primary osteoarthritis of both hips 09/25/2016   Fibromyalgia 04/28/2016   Primary osteoarthritis of both knees 04/28/2016   History of total knee replacement, bilateral  04/28/2016   Primary osteoarthritis of both hands 04/28/2016   Primary insomnia 04/28/2016   History of sleep apnea 04/28/2016   History of restless legs syndrome 04/28/2016   Osteopenia of multiple sites 04/28/2016   Trochanteric bursitis of left hip 02/03/2016   Hiatal hernia with gastroesophageal reflux 11/06/2015   Hiatal hernia 03/25/2015   Gastroesophageal reflux disease with esophagitis 01/17/2015   Ligamentous laxity of knee 01/05/2011   Knee effusion, right 11/10/2010   Knee pain 11/10/2010   Incisional hernia 10/16/2010   HYPERTENSION 05/14/2010   OBSTRUCTIVE SLEEP APNEA 05/14/2010   Obesity 04/08/2009   04/10/2009, PT 04/03/21 11:12 AM   [facility] @ [facility] [address] [place], [place], [place] Phone: [phone]   Fax:  [phone]  Name: [phone] F Romero MRN: Miranda Date of Birth: 1945/07/25

## 2021-04-03 NOTE — Patient Instructions (Addendum)
Trigger Point Dry Needling  What is Trigger Point Dry Needling (DN)? DN is a physical therapy technique used to treat muscle pain and dysfunction. Specifically, DN helps deactivate muscle trigger points (muscle knots).  A thin filiform needle is used to penetrate the skin and stimulate the underlying trigger point. The goal is for a local twitch response (LTR) to occur and for the trigger point to relax. No medication of any kind is injected during the procedure.   What Does Trigger Point Dry Needling Feel Like?  The procedure feels different for each individual patient. Some patients report that they do not actually feel the needle enter the skin and overall the process is not painful. Very mild bleeding may occur. However, many patients feel a deep cramping in the muscle in which the needle was inserted. This is the local twitch response.   How Will I feel after the treatment? Soreness is normal, and the onset of soreness may not occur for a few hours. Typically this soreness does not last longer than two days.  Bruising is uncommon, however; ice can be used to decrease any possible bruising.  In rare cases feeling tired or nauseous after the treatment is normal. In addition, your symptoms may get worse before they get better, this period will typically not last longer than 24 hours.   What Can I do After My Treatment? Increase your hydration by drinking more water for the next 24 hours. You may place ice or heat on the areas treated that have become sore, however, do not use heat on inflamed or bruised areas. Heat often brings more relief post needling. You can continue your regular activities, but vigorous activity is not recommended initially after the treatment for 24 hours. DN is best combined with other physical therapy such as strengthening, stretching, and other therapies. Access Code: [email or web address] URL: [email or web address] Date: 04/03/2021 Prepared by:  Tresa Endo  Exercises Supine Lower Trunk Rotation - 1-2 x daily - 7 x weekly - 1 sets - 5 reps - 10 sec hold Supine Piriformis Stretch with Foot on Ground - 1-2 x daily - 7 x weekly - 1 sets - 3 reps - 20 sec hold Supine Single Knee to Chest Stretch - 1-2 x daily - 7 x weekly - 1 sets - 3 reps - 20 sec hold Supine Transversus Abdominis Bracing - Hands on Stomach - 1-2 x daily - 7 x weekly - 2 sets - 10 reps - 5 sec hold Supine Hip Adduction Isometric with Ball - 1 x daily - 7 x weekly - 2 sets - 10 reps - 5 sec hold Hooklying Clamshell with Resistance - 1 x daily - 7 x weekly - 2 sets - 10 reps Seated Hamstring Stretch - 2-3 x daily - 7 x weekly - 1 sets - 3 reps - 20 hold Seated Figure 4 Piriformis Stretch - 2-3 x daily - 7 x weekly - 1 sets - 3 reps - 20 hold Sit to Stand - 2 x daily - 7 x weekly - 2 sets - 10 reps   [facility]  [address] [place] [address].  [phone]

## 2021-04-09 ENCOUNTER — Ambulatory Visit: Payer: Medicare PPO

## 2021-04-09 ENCOUNTER — Other Ambulatory Visit: Payer: Self-pay

## 2021-04-09 DIAGNOSIS — M6281 Muscle weakness (generalized): Secondary | ICD-10-CM | POA: Diagnosis not present

## 2021-04-09 DIAGNOSIS — M62830 Muscle spasm of back: Secondary | ICD-10-CM | POA: Diagnosis not present

## 2021-04-09 DIAGNOSIS — M85851 Other specified disorders of bone density and structure, right thigh: Secondary | ICD-10-CM | POA: Diagnosis not present

## 2021-04-09 DIAGNOSIS — M5136 Other intervertebral disc degeneration, lumbar region: Secondary | ICD-10-CM | POA: Diagnosis not present

## 2021-04-09 DIAGNOSIS — R262 Difficulty in walking, not elsewhere classified: Secondary | ICD-10-CM | POA: Diagnosis not present

## 2021-04-09 DIAGNOSIS — M4856XD Collapsed vertebra, not elsewhere classified, lumbar region, subsequent encounter for fracture with routine healing: Secondary | ICD-10-CM | POA: Diagnosis not present

## 2021-04-09 DIAGNOSIS — Z96653 Presence of artificial knee joint, bilateral: Secondary | ICD-10-CM | POA: Diagnosis not present

## 2021-04-09 DIAGNOSIS — M797 Fibromyalgia: Secondary | ICD-10-CM | POA: Diagnosis not present

## 2021-04-09 DIAGNOSIS — Z9181 History of falling: Secondary | ICD-10-CM | POA: Diagnosis not present

## 2021-04-09 NOTE — Therapy (Signed)
[facility] [address] [place], [place], [address] Phone: [phone]   Fax:  [phone]  Physical Therapy Treatment  Patient Details  Name: Miranda Romero MRN: [id number] Date of Birth: 24-Sep-1945 Referring Provider (PT): Dr Corliss Skains   Encounter Date: 04/09/2021   PT End of Session - 04/09/21 1019     Visit Number 3    Date for PT Re-Evaluation 06/20/21    Authorization Type [employer]    Authorization Time Period 12 visits approved 1/17-06/20/21    Authorization - Visit Number 3    Authorization - Number of Visits 12    Progress Note Due on Visit 10    PT Start Time 0931    PT Stop Time 1017    PT Time Calculation (min) 46 min    Activity Tolerance Patient tolerated treatment well    Behavior During Therapy [facility] for tasks assessed/performed             Past Medical History:  Diagnosis Date   Arthritis    Blood transfusion    Compression fracture of L3 vertebra (HCC)    Fibromyalgia    Dr Corliss Skains   GERD (gastroesophageal reflux disease)    Hiatal hernia with gastroesophageal reflux 11/06/2015   History of hiatal hernia    Hyperlipidemia    Hypertension    Dr Wylene Simmer LOV 4/13 on chart   Seasonal allergies    Sleep apnea    SEVERE  per sleep study 3/12 EPIC no CPAP    Transfusion history    3 yrs ago s/p surgery for knee   Umbilical hernia    Weight decrease    Wrist fracture    Left wrist    Past Surgical History:  Procedure Laterality Date   ANKLE SURGERY     APPENDECTOMY  2014   BALLOON DILATION N/A 06/05/2015   Procedure: BALLOON DILATION;  Surgeon: Willis Modena, MD;  Location: [facility];  Service: Endoscopy;  Laterality: N/A;   CATARACT EXTRACTION Bilateral 05/2013   CHOLECYSTECTOMY     DILATION AND CURETTAGE OF UTERUS     ESOPHAGOGASTRODUODENOSCOPY (EGD) WITH PROPOFOL N/A 06/05/2015   Procedure: ESOPHAGOGASTRODUODENOSCOPY (EGD) WITH PROPOFOL;  Surgeon: Willis Modena, MD;   [facility]: [facility];  Service: Endoscopy;  Laterality: N/A;   HERNIA REPAIR  05/2013   due to appendectomy with mesh   HIATAL HERNIA REPAIR N/A 11/06/2015   Procedure: LAPAROSCOPIC REPAIR HIATAL HERNIA, Nissen FUNDOPLICATION;  Surgeon: Claud Kelp, MD;  Location: [facility];  Service: General;  Laterality: N/A;   JOINT REPLACEMENT     KNEE ARTHROPLASTY Bilateral    knee infection Right    knee hardware removed 11/12/   replaced 1/13   spine injection     [facility]   TOTAL HIP ARTHROPLASTY Left 11/09/2017   Procedure: LEFT TOTAL HIP ARTHROPLASTY;  Surgeon: Valeria Batman, MD;  Location: [facility];  Service: Orthopedics;  Laterality: Left;   VENTRAL HERNIA REPAIR  07/21/2011   Procedure: LAPAROSCOPIC VENTRAL HERNIA;  Surgeon: Ernestene Mention, MD;  Location: [facility];  Service: General;  Laterality: N/A;  Laparoscopic Repair Verntal Incisional Hernia and Mesh    There were no vitals filed for this visit.   Subjective Assessment - 04/09/21 0933     Subjective The dry needling helped me.  I felt different after my last treatment.    Pertinent History B TKA, Fibromyalgia, closed compression Fx of L2, osteopenia with DEXA 04/29/20  T-score of -  1.2 of R hip.    Currently in Pain? Yes    Pain Score 6     Pain Location Back    Pain Orientation Lower    Pain Descriptors / Indicators Sharp    Pain Type Chronic pain    Pain Onset More than a month ago    Pain Frequency Intermittent    Aggravating Factors  standing and walking    Pain Relieving Factors dry needling, stretching                [facility] PT Assessment - 04/09/21 0001       Transfers   Five time sit to stand comments  15.89- min pushing on thighs                           [facility] Adult PT Treatment/Exercise - 04/09/21 0001       Lumbar Exercises: Stretches   Active Hamstring Stretch Left;Right;3 reps;20 seconds    Piriformis Stretch 3 reps;20 seconds      Lumbar Exercises: Aerobic   Nustep  Level 4x 6 min-PT present to discuss progress      Lumbar Exercises: Supine   Ab Set 20 reps    Clam 20 reps    Clam Limitations yellow band      Knee/Hip Exercises: Seated   Sit to Sand with UE support;10 reps      Manual Therapy   Manual Therapy Soft tissue mobilization;Myofascial release    Manual therapy comments skilled palpation and monitoring with DN today    Soft tissue mobilization elongation and release to bil lumbar paraspinals and proximal gluteals              Trigger Point Dry Needling - 04/09/21 0001     Consent Given? Yes    Education Handout Provided Previously provided    Muscles Treated Back/Hip Gluteus minimus;Gluteus medius;Piriformis;Lumbar multifidi    Gluteus Minimus Response Twitch response elicited;Palpable increased muscle length    Gluteus Medius Response Twitch response elicited;Palpable increased muscle length    Piriformis Response Twitch response elicited;Palpable increased muscle length    Lumbar multifidi Response Twitch response elicited;Palpable increased muscle length                     PT Short Term Goals - 04/09/21 0951       PT SHORT TERM GOAL #1   Title Pt will be independent with initial HEP.    Status On-going               PT Long Term Goals - 04/01/21 1543       PT LONG TERM GOAL #1   Title Pt will be independent with advanced HEP.    Time 12    Period Weeks    Status New      PT LONG TERM GOAL #2   Title Pt will increase FOTO to at least 52% to demonstrate improved functional mobility.    Time 12    Period Weeks    Status New      PT LONG TERM GOAL #3   Title Pt will be able to ambulate for greater than 20 minutes with LRAD to allow her to go shopping at a small store.    Time 12    Period Weeks    Status New      PT LONG TERM GOAL #4   Title Pt will increase BLE strength to  at least 4+/5 to allow her to negotiate steps without difficulty.    Time 12    Period Weeks    Status New       PT LONG TERM GOAL #5   Title Pt will have BERG of at least 48/56 to place her at a lower risk of falling.    Time 12    Period Weeks    Status New                   Plan - 04/09/21 0950     Clinical Impression Statement Pt reports reduced pain after DN last session and arrived with a positive outlook regarding her pain. Pain levels are reduced overall and 5x sit to stand is improved today.   Pt is also independent and compliant in her initial HEP.   Pt had good response to DN and manual therapy with improved elongation and report of improved mobility after session.  Pt will continue to benefit from skilled PT to address strength, flexibility and tissue mobility to improve function and endurance.    Comorbidities B TKA, Fibromyalgia, closed compression Fx of L2, osteopenia with DEXA 04/29/20  T-score of -1.2 of R hip.    PT Frequency 2x / week    PT Duration 12 weeks    PT Treatment/Interventions ADLs/Self Care Home Management;Aquatic Therapy;Cryotherapy;Electrical Stimulation;Iontophoresis 4mg /ml Dexamethasone;Moist Heat;Traction;Ultrasound;Gait training;Stair training;Functional mobility training;Therapeutic activities;Therapeutic exercise;Balance training;Neuromuscular re-education;Patient/family education;Manual techniques;Passive range of motion;Dry needling;Taping;Spinal Manipulations;Joint Manipulations    PT Next Visit Plan assess and progress HEP, strengthening, core stability.  continue DN as helpful    PT Home Exercise Plan Access Code: [email or web address]    Consulted and Agree with Plan of Care Patient             Patient will benefit from skilled therapeutic intervention in order to improve the following deficits and impairments:  Difficulty walking, Increased muscle spasms, Pain, Impaired flexibility, Decreased balance, Decreased strength, Postural dysfunction  Visit Diagnosis: Difficulty in walking, not elsewhere classified  Muscle weakness (generalized)  Muscle spasm of  back     Problem List Patient Active Problem List   Diagnosis Date Noted   Lumbar compression fracture (HCC) 06/19/2020   Colles' fracture of left radius, initial encounter for closed fracture 05/09/2020   Osteoarthritis of left hip 11/09/2017   S/P hip replacement, left 11/09/2017   Primary osteoarthritis of both hips 09/25/2016   Fibromyalgia 04/28/2016   Primary osteoarthritis of both knees 04/28/2016   History of total knee replacement, bilateral 04/28/2016   Primary osteoarthritis of both hands 04/28/2016   Primary insomnia 04/28/2016   History of sleep apnea 04/28/2016   History of restless legs syndrome 04/28/2016   Osteopenia of multiple sites 04/28/2016   Trochanteric bursitis of left hip 02/03/2016   Hiatal hernia with gastroesophageal reflux 11/06/2015   Hiatal hernia 03/25/2015   Gastroesophageal reflux disease with esophagitis 01/17/2015   Ligamentous laxity of knee 01/05/2011   Knee effusion, right 11/10/2010   Knee pain 11/10/2010   Incisional hernia 10/16/2010   HYPERTENSION 05/14/2010   OBSTRUCTIVE SLEEP APNEA 05/14/2010   Obesity 04/08/2009   Lorrene ReidKelly Tannor Pyon, PT 04/09/21 10:21 AM   [facility] [address] [place], [place], [address] Phone: [phone]   Fax:  [phone]  Name: Miranda Romero MRN: [id number] Date of Birth: 1945-04-06

## 2021-04-14 ENCOUNTER — Ambulatory Visit: Payer: Medicare PPO

## 2021-04-14 ENCOUNTER — Other Ambulatory Visit: Payer: Self-pay

## 2021-04-14 DIAGNOSIS — Z9181 History of falling: Secondary | ICD-10-CM | POA: Diagnosis not present

## 2021-04-14 DIAGNOSIS — Z96653 Presence of artificial knee joint, bilateral: Secondary | ICD-10-CM | POA: Diagnosis not present

## 2021-04-14 DIAGNOSIS — M62830 Muscle spasm of back: Secondary | ICD-10-CM

## 2021-04-14 DIAGNOSIS — M85851 Other specified disorders of bone density and structure, right thigh: Secondary | ICD-10-CM | POA: Diagnosis not present

## 2021-04-14 DIAGNOSIS — M6281 Muscle weakness (generalized): Secondary | ICD-10-CM | POA: Diagnosis not present

## 2021-04-14 DIAGNOSIS — M797 Fibromyalgia: Secondary | ICD-10-CM | POA: Diagnosis not present

## 2021-04-14 DIAGNOSIS — M4856XD Collapsed vertebra, not elsewhere classified, lumbar region, subsequent encounter for fracture with routine healing: Secondary | ICD-10-CM | POA: Diagnosis not present

## 2021-04-14 DIAGNOSIS — M5136 Other intervertebral disc degeneration, lumbar region: Secondary | ICD-10-CM | POA: Diagnosis not present

## 2021-04-14 DIAGNOSIS — R262 Difficulty in walking, not elsewhere classified: Secondary | ICD-10-CM | POA: Diagnosis not present

## 2021-04-14 NOTE — Therapy (Signed)
[facility] [address] [place], [place], [address] Phone: [phone]   Fax:  [phone]  Physical Therapy Treatment  Patient Details  Name: Miranda Romero MRN: [id number] Date of Birth: [birth year] Referring Provider (PT): Dr Corliss Skainseveshwar   Encounter Date: 04/14/2021   PT End of Session - 04/14/21 1456     Visit Number 4    Date for PT Re-Evaluation 06/20/21    Authorization Type [employer]    Authorization Time Period 12 visits approved 1/17-06/20/21    Authorization - Visit Number 4    Authorization - Number of Visits 12    Progress Note Due on Visit 10    PT Start Time 1403    PT Stop Time 1450    PT Time Calculation (min) 47 min    Activity Tolerance Patient tolerated treatment well    Behavior During Therapy [facility] for tasks assessed/performed             Past Medical History:  Diagnosis Date   Arthritis    Blood transfusion    Compression fracture of L3 vertebra (HCC)    Fibromyalgia    Dr Corliss Skainseveshwar   GERD (gastroesophageal reflux disease)    Hiatal hernia with gastroesophageal reflux 11/06/2015   History of hiatal hernia    Hyperlipidemia    Hypertension    Dr Wylene Simmerisovec LOV 4/13 on chart   Seasonal allergies    Sleep apnea    SEVERE  per sleep study 3/12 EPIC no CPAP    Transfusion history    3 yrs ago s/p surgery for knee   Umbilical hernia    Weight decrease    Wrist fracture    Left wrist    Past Surgical History:  Procedure Laterality Date   ANKLE SURGERY     APPENDECTOMY  2014   BALLOON DILATION N/A 06/05/2015   Procedure: BALLOON DILATION;  Surgeon: Willis ModenaWilliam Outlaw, MD;  [facility];  Service: Endoscopy;  Laterality: N/A;   CATARACT EXTRACTION Bilateral 05/2013   CHOLECYSTECTOMY     DILATION AND CURETTAGE OF UTERUS     ESOPHAGOGASTRODUODENOSCOPY (EGD) WITH PROPOFOL N/A 06/05/2015   Procedure: ESOPHAGOGASTRODUODENOSCOPY (EGD) WITH PROPOFOL;  Surgeon: Willis ModenaWilliam Outlaw, MD;   [facility];  Service: Endoscopy;  Laterality: N/A;   HERNIA REPAIR  05/2013   due to appendectomy with mesh   HIATAL HERNIA REPAIR N/A 11/06/2015   Procedure: LAPAROSCOPIC REPAIR HIATAL HERNIA, Nissen FUNDOPLICATION;  Surgeon: Claud KelpHaywood Ingram, MD;  [facility];  Service: General;  Laterality: N/A;   JOINT REPLACEMENT     KNEE ARTHROPLASTY Bilateral    knee infection Right    knee hardware removed 11/12/   replaced 1/13   spine injection     [facility]   TOTAL HIP ARTHROPLASTY Left 11/09/2017   Procedure: LEFT TOTAL HIP ARTHROPLASTY;  Surgeon: Valeria BatmanWhitfield, Peter W, MD;  [facility]: [facility];  Service: Orthopedics;  Laterality: Left;   VENTRAL HERNIA REPAIR  07/21/2011   Procedure: LAPAROSCOPIC VENTRAL HERNIA;  Surgeon: Ernestene MentionHaywood M Ingram, MD;  Location: [facility];  Service: General;  Laterality: N/A;  Laparoscopic Repair Verntal Incisional Hernia and Mesh    There were no vitals filed for this visit.   Subjective Assessment - 04/14/21 1406     Subjective I had a lot of LBP after last session.  I want to do DN on Wednesday.    Pertinent History B TKA, Fibromyalgia, closed compression Fx of L2, osteopenia with DEXA  04/29/20  T-score of -1.2 of R hip.    Patient Stated Goals To relieve the pain in some way and help get better mobility.    Currently in Pain? Yes    Pain Score 4     Pain Location Back    Pain Orientation Lower    Pain Descriptors / Indicators Sharp    Pain Onset More than a month ago    Pain Frequency Intermittent    Aggravating Factors  standing and walking    Pain Relieving Factors dry needling, stretching                               [facility] Adult PT Treatment/Exercise - 04/14/21 0001       Lumbar Exercises: Stretches   Active Hamstring Stretch Left;Right;3 reps;20 seconds      Lumbar Exercises: Aerobic   Nustep Level 4x 6 min-PT present to discuss progress      Lumbar Exercises: Seated   Other Seated Lumbar Exercises shoulder  flexion 1# with ab bracing 1# 2x10      Lumbar Exercises: Supine   Ab Set 20 reps    Other Supine Lumbar Exercises supine with ab bracing: yellow theraband horizontal abduction 2x10      Knee/Hip Exercises: Seated   Sit to Sand 2 sets;10 reps   min UE support, sitting on pad     Knee/Hip Exercises: Supine   Short Arc Quad Sets Strengthening;Both;2 sets;10 reps    Short Arc Quad Sets Limitations ab bracing      Manual Therapy   Manual Therapy Soft tissue mobilization;Myofascial release    Soft tissue mobilization addaday to bil gluteals and lumbar paraspinals                       PT Short Term Goals - 04/09/21 0951       PT SHORT TERM GOAL #1   Title Pt will be independent with initial HEP.    Status On-going               PT Long Term Goals - 04/01/21 1543       PT LONG TERM GOAL #1   Title Pt will be independent with advanced HEP.    Time 12    Period Weeks    Status New      PT LONG TERM GOAL #2   Title Pt will increase FOTO to at least 52% to demonstrate improved functional mobility.    Time 12    Period Weeks    Status New      PT LONG TERM GOAL #3   Title Pt will be able to ambulate for greater than 20 minutes with LRAD to allow her to go shopping at a small store.    Time 12    Period Weeks    Status New      PT LONG TERM GOAL #4   Title Pt will increase BLE strength to at least 4+/5 to allow her to negotiate steps without difficulty.    Time 12    Period Weeks    Status New      PT LONG TERM GOAL #5   Title Pt will have BERG of at least 48/56 to place her at a lower risk of falling.    Time 12    Period Weeks    Status New  Plan - 04/14/21 1421     Clinical Impression Statement Pt has had increased LBP since the last session but has stayed consistent with her HEP.  Session today focused on strength and flexibility in addition to manual therapy to address tissue mobility. Pt reports that she is able to  stand a bit longer before need to rest and is not able to quantify this today.  Pt requires some tactile and verbal cueing for core activation and scapular depression with exercise.   Pt will continue to benefit from skilled PT to address strength, flexibility and tissue mobility to improve function and endurance.    Comorbidities B TKA, Fibromyalgia, closed compression Fx of L2, osteopenia with DEXA 04/29/20  T-score of -1.2 of R hip.    PT Frequency 2x / week    PT Duration 12 weeks    PT Treatment/Interventions ADLs/Self Care Home Management;Aquatic Therapy;Cryotherapy;Electrical Stimulation;Iontophoresis 4mg /ml Dexamethasone;Moist Heat;Traction;Ultrasound;Gait training;Stair training;Functional mobility training;Therapeutic activities;Therapeutic exercise;Balance training;Neuromuscular re-education;Patient/family education;Manual techniques;Passive range of motion;Dry needling;Taping;Spinal Manipulations;Joint Manipulations    PT Next Visit Plan DN to lumbar and gluteals, core strength    PT Home Exercise Plan Access Code: [email or web address]             Patient will benefit from skilled therapeutic intervention in order to improve the following deficits and impairments:  Difficulty walking, Increased muscle spasms, Pain, Impaired flexibility, Decreased balance, Decreased strength, Postural dysfunction  Visit Diagnosis: Difficulty in walking, not elsewhere classified  Muscle weakness (generalized)  Muscle spasm of back     Problem List Patient Active Problem List   Diagnosis Date Noted   Lumbar compression fracture (HCC) 06/19/2020   Colles' fracture of left radius, initial encounter for closed fracture 05/09/2020   Osteoarthritis of left hip 11/09/2017   S/P hip replacement, left 11/09/2017   Primary osteoarthritis of both hips 09/25/2016   Fibromyalgia 04/28/2016   Primary osteoarthritis of both knees 04/28/2016   History of total knee replacement, bilateral 04/28/2016   Primary  osteoarthritis of both hands 04/28/2016   Primary insomnia 04/28/2016   History of sleep apnea 04/28/2016   History of restless legs syndrome 04/28/2016   Osteopenia of multiple sites 04/28/2016   Trochanteric bursitis of left hip 02/03/2016   Hiatal hernia with gastroesophageal reflux 11/06/2015   Hiatal hernia 03/25/2015   Gastroesophageal reflux disease with esophagitis 01/17/2015   Ligamentous laxity of knee 01/05/2011   Knee effusion, right 11/10/2010   Knee pain 11/10/2010   Incisional hernia 10/16/2010   HYPERTENSION 05/14/2010   OBSTRUCTIVE SLEEP APNEA 05/14/2010   Obesity 04/08/2009   04/10/2009, PT 04/14/21 2:59 PM   [facility] [address] [place], [place], [place] Phone: [phone]   Fax:  [phone]  Name: [phone] F Matin MRN: Miranda Romero Date of Birth: 1946/02/21

## 2021-04-21 ENCOUNTER — Encounter: Payer: Self-pay | Admitting: Rehabilitative and Restorative Service Providers"

## 2021-04-21 ENCOUNTER — Other Ambulatory Visit: Payer: Self-pay

## 2021-04-21 ENCOUNTER — Ambulatory Visit: Payer: Medicare PPO | Attending: Rheumatology | Admitting: Rehabilitative and Restorative Service Providers"

## 2021-04-21 DIAGNOSIS — M6281 Muscle weakness (generalized): Secondary | ICD-10-CM | POA: Insufficient documentation

## 2021-04-21 DIAGNOSIS — R262 Difficulty in walking, not elsewhere classified: Secondary | ICD-10-CM | POA: Diagnosis not present

## 2021-04-21 DIAGNOSIS — M62830 Muscle spasm of back: Secondary | ICD-10-CM | POA: Diagnosis not present

## 2021-04-21 NOTE — Therapy (Signed)
[facility] @ [facility] [place], [place], [address] Phone: [phone]   Fax:  [phone]  Physical Therapy Treatment  Patient Details  Name: Miranda Romero MRN: [id number] Date of Birth: 03/25/1945 Referring Provider (PT): Dr Estanislado Pandy   Encounter Date: 04/21/2021   PT End of Session - 04/21/21 1233     Visit Number 5    Date for PT Re-Evaluation 06/20/21    Authorization Type [employer]    Authorization Time Period 12 visits approved 1/17-06/20/21    Authorization - Visit Number 5    Authorization - Number of Visits 12    Progress Note Due on Visit 10    PT Start Time 1230    PT Stop Time 1310    PT Time Calculation (min) 40 min    Activity Tolerance Patient tolerated treatment well    Behavior During Therapy [facility] for tasks assessed/performed             Past Medical History:  Diagnosis Date   Arthritis    Blood transfusion    Compression fracture of L3 vertebra (HCC)    Fibromyalgia    Dr Estanislado Pandy   GERD (gastroesophageal reflux disease)    Hiatal hernia with gastroesophageal reflux 11/06/2015   History of hiatal hernia    Hyperlipidemia    Hypertension    Dr Osborne Casco LOV 4/13 on chart   Seasonal allergies    Sleep apnea    SEVERE  per sleep study 3/12 EPIC no CPAP    Transfusion history    3 yrs ago s/p surgery for knee   Umbilical hernia    Weight decrease    Wrist fracture    Left wrist    Past Surgical History:  Procedure Laterality Date   ANKLE SURGERY     APPENDECTOMY  2014   BALLOON DILATION N/A 06/05/2015   Procedure: BALLOON DILATION;  Surgeon: Arta Silence, MD;  Location: [facility];  Service: Endoscopy;  Laterality: N/A;   CATARACT EXTRACTION Bilateral 05/2013   CHOLECYSTECTOMY     DILATION AND CURETTAGE OF UTERUS     ESOPHAGOGASTRODUODENOSCOPY (EGD) WITH PROPOFOL N/A 06/05/2015   Procedure: ESOPHAGOGASTRODUODENOSCOPY (EGD) WITH PROPOFOL;  Surgeon: Arta Silence, MD;   Location: [facility];  Service: Endoscopy;  Laterality: N/A;   HERNIA REPAIR  05/2013   due to appendectomy with mesh   HIATAL HERNIA REPAIR N/A 11/06/2015   Procedure: LAPAROSCOPIC REPAIR HIATAL HERNIA, Nissen FUNDOPLICATION;  Surgeon: Fanny Skates, MD;  Location: [facility];  Service: General;  Laterality: N/A;   JOINT REPLACEMENT     KNEE ARTHROPLASTY Bilateral    knee infection Right    knee hardware removed 11/12/   replaced 1/13   spine injection     [place] Left 11/09/2017   Procedure: LEFT TOTAL HIP ARTHROPLASTY;  Surgeon: Garald Balding, MD;  Location: [place];  Service: Orthopedics;  Laterality: Left;   VENTRAL HERNIA REPAIR  07/21/2011   Procedure: LAPAROSCOPIC VENTRAL HERNIA;  Surgeon: Adin Hector, MD;  Location: [facility];  Service: General;  Laterality: N/A;  Laparoscopic Repair Verntal Incisional Hernia and Mesh    There were no vitals filed for this visit.   Subjective Assessment - 04/21/21 1325     Subjective I missed not getting dry needling last time, can we do that again.    Pertinent History B TKA, Fibromyalgia, closed compression Fx of L2, osteopenia with DEXA 04/29/20  T-score of -  1.2 of R hip.    Patient Stated Goals To relieve the pain in some way and help get better mobility.    Currently in Pain? Yes    Pain Score 5     Pain Location Back    Pain Orientation Lower    Pain Descriptors / Indicators Sharp    Pain Type Chronic pain                               [facility] Adult PT Treatment/Exercise - 04/21/21 0001       Lumbar Exercises: Stretches   Passive Hamstring Stretch Right;Left;2 reps;20 seconds    Passive Hamstring Stretch Limitations in supine with strap    Piriformis Stretch Right;Left;2 reps;20 seconds    Piriformis Stretch Limitations in hooklying      Lumbar Exercises: Aerobic   Nustep Level 4x 6 min-PT present to discuss progress   612 steps     Lumbar Exercises: Seated   Long Arc  Quad on Chair Strengthening;Both;2 sets;10 reps    LAQ on Chair Weights (lbs) 2    LAQ on Chair Limitations with cuing for TrA contraction    Other Seated Lumbar Exercises shoulder flexion 1# with ab bracing 1# 2x10      Lumbar Exercises: Supine   Bridge 20 reps    Other Supine Lumbar Exercises supine with ab bracing: yellow theraband horizontal abduction 2x10      Knee/Hip Exercises: Seated   Sit to Sand 2 sets;10 reps   sitting on foam pad, no UE use required     Manual Therapy   Manual Therapy Soft tissue mobilization;Myofascial release    Manual therapy comments skilled palpation and monitoring with DN today    Soft tissue mobilization elongation and release to bil lumbar paraspinals and proximal gluteals    Myofascial Release addaday to bil gluteals and lumbar paraspinals              Trigger Point Dry Needling - 04/21/21 0001     Consent Given? Yes    Education Handout Provided Previously provided    Muscles Treated Back/Hip Gluteus minimus;Gluteus medius;Piriformis;Lumbar multifidi    Gluteus Minimus Response Twitch response elicited;Palpable increased muscle length    Gluteus Medius Response Twitch response elicited;Palpable increased muscle length    Piriformis Response Twitch response elicited;Palpable increased muscle length    Lumbar multifidi Response Twitch response elicited;Palpable increased muscle length                     PT Short Term Goals - 04/21/21 1329       PT SHORT TERM GOAL #1   Title Pt will be independent with initial HEP.    Status Partially Met               PT Long Term Goals - 04/01/21 1543       PT LONG TERM GOAL #1   Title Pt will be independent with advanced HEP.    Time 12    Period Weeks    Status New      PT LONG TERM GOAL #2   Title Pt will increase FOTO to at least 52% to demonstrate improved functional mobility.    Time 12    Period Weeks    Status New      PT LONG TERM GOAL #3   Title Pt will be  able to ambulate for greater than 20 minutes  with LRAD to allow her to go shopping at a small store.    Time 12    Period Weeks    Status New      PT LONG TERM GOAL #4   Title Pt will increase BLE strength to at least 4+/5 to allow her to negotiate steps without difficulty.    Time 12    Period Weeks    Status New      PT LONG TERM GOAL #5   Title Pt will have BERG of at least 48/56 to place her at a lower risk of falling.    Time 12    Period Weeks    Status New                   Plan - 04/21/21 1326     Clinical Impression Statement Ms Stetzer continues with increased LBP and states that when she did not opt for DN last session, she forgot that she did not have a follow up appointment on Wednesday.  Pt responeded well to DN and use of Addaday and reported that pain decreased to 2/10 following treatment.  Attempted seated marching, but pt with increased pain, so did not complete that exercise.  All other ther ex, pt tolerated well without increased pain.  Pt required min cuing for transversus abdomenus contraction during ther ex, but was able to complete. Pt required cuing and education on utilizing the log roll for supine to/from sit to decrease strain on her back.  Pt able to return demonstration once cued.  Pt continues to require skilled PT to continue to progress towards goal related activities.    Personal Factors and Comorbidities Age;Comorbidity 3+    Comorbidities B TKA, Fibromyalgia, closed compression Fx of L2, osteopenia with DEXA 04/29/20  T-score of -1.2 of R hip.    PT Treatment/Interventions ADLs/Self Care Home Management;Aquatic Therapy;Cryotherapy;Electrical Stimulation;Iontophoresis 24m/ml Dexamethasone;Moist Heat;Traction;Ultrasound;Gait training;Stair training;Functional mobility training;Therapeutic activities;Therapeutic exercise;Balance training;Neuromuscular re-education;Patient/family education;Manual techniques;Passive range of motion;Dry  needling;Taping;Spinal Manipulations;Joint Manipulations    PT Next Visit Plan DN and manual therapy to lumbar and gluteals as inddicated, core strength    PT Home Exercise Plan Access Code: [email or web address]    Consulted and Agree with Plan of Care Patient             Patient will benefit from skilled therapeutic intervention in order to improve the following deficits and impairments:  Difficulty walking, Increased muscle spasms, Pain, Impaired flexibility, Decreased balance, Decreased strength, Postural dysfunction  Visit Diagnosis: Difficulty in walking, not elsewhere classified  Muscle weakness (generalized)  Muscle spasm of back     Problem List Patient Active Problem List   Diagnosis Date Noted   [place]bar compression fracture ([place] 06/19/2020   Colles' fracture of left radius, initial encounter for closed fracture 05/09/2020   Osteoarthritis of left hip 11/09/2017   S/P hip replacement, left 11/09/2017   Primary osteoarthritis of both hips 09/25/2016   Fibromyalgia 04/28/2016   Primary osteoarthritis of both knees 04/28/2016   History of total knee replacement, bilateral 04/28/2016   Primary osteoarthritis of both hands 04/28/2016   Primary insomnia 04/28/2016   History of sleep apnea 04/28/2016   History of restless legs syndrome 04/28/2016   Osteopenia of multiple sites 04/28/2016   Trochanteric bursitis of left hip 02/03/2016   Hiatal hernia with gastroesophageal reflux 11/06/2015   Hiatal hernia 03/25/2015   Gastroesophageal reflux disease with esophagitis 01/17/2015   Ligamentous laxity of knee 01/05/2011   Knee effusion, right  11/10/2010   Knee pain 11/10/2010   Incisional hernia 10/16/2010   HYPERTENSION 05/14/2010   OBSTRUCTIVE SLEEP APNEA 05/14/2010   Obesity 04/08/2009    Juel Burrow, PT, DPT 04/21/2021, 1:30 PM  [facility] @ [facility] [address] [address] [place], [place], [address] Phone: [phone]   Fax:   [phone]  Name: Miranda Romero MRN: [id number] Date of Birth: 27-Nov-1945

## 2021-04-22 NOTE — Therapy (Signed)
OUTPATIENT PHYSICAL THERAPY TREATMENT    Patient Name: Miranda Romero MRN: [id number] DOB:1945/04/16, 76 y.o., female Today's Date: 04/23/2021   PT End of Session - 04/23/21 1316     Visit Number 6    Date for PT Re-Evaluation 06/20/21    Authorization Type [employer]    Authorization Time Period 12 visits approved 1/17-06/20/21    Authorization - Visit Number 6    Authorization - Number of Visits 12    Progress Note Due on Visit 10    PT Start Time 1234    PT Stop Time 1316    PT Time Calculation (min) 42 min    Activity Tolerance Patient tolerated treatment well    Behavior During Therapy [facility] for tasks assessed/performed             Past Medical History:  Diagnosis Date   Arthritis    Blood transfusion    Compression fracture of L3 vertebra (HCC)    Fibromyalgia    Dr Estanislado Pandy   GERD (gastroesophageal reflux disease)    Hiatal hernia with gastroesophageal reflux 11/06/2015   History of hiatal hernia    Hyperlipidemia    Hypertension    Dr Osborne Casco LOV 4/13 on chart   Seasonal allergies    Sleep apnea    SEVERE  per sleep study 3/12 EPIC no CPAP    Transfusion history    3 yrs ago s/p surgery for knee   Umbilical hernia    Weight decrease    Wrist fracture    Left wrist   Past Surgical History:  Procedure Laterality Date   ANKLE SURGERY     APPENDECTOMY  2014   BALLOON DILATION N/A 06/05/2015   Procedure: BALLOON DILATION;  Surgeon: Arta Silence, MD;  [facility];  Service: Endoscopy;  Laterality: N/A;   CATARACT EXTRACTION Bilateral 05/2013   CHOLECYSTECTOMY     DILATION AND CURETTAGE OF UTERUS     ESOPHAGOGASTRODUODENOSCOPY (EGD) WITH PROPOFOL N/A 06/05/2015   Procedure: ESOPHAGOGASTRODUODENOSCOPY (EGD) WITH PROPOFOL;  Surgeon: Arta Silence, MD;  [facility];  Service: Endoscopy;  Laterality: N/A;   HERNIA REPAIR  05/2013   due to appendectomy with mesh   HIATAL HERNIA REPAIR N/A 11/06/2015   Procedure: LAPAROSCOPIC  REPAIR HIATAL HERNIA, Nissen FUNDOPLICATION;  Surgeon: Fanny Skates, MD;  Location[facility];  Service: General;  Laterality: N/A;   JOINT REPLACEMENT     KNEE ARTHROPLASTY Bilateral    knee infection Right    knee hardware removed 11/12/   replaced 1/13   spine injection     [place] Left 11/09/2017   Procedure: LEFT TOTAL HIP ARTHROPLASTY;  Surgeon: Garald Balding, MD;  Location: [place];  Service: Orthopedics;  Laterality: Left;   VENTRAL HERNIA REPAIR  07/21/2011   Procedure: LAPAROSCOPIC VENTRAL HERNIA;  Surgeon: Adin Hector, MD;  Location: [facility];  Service: General;  Laterality: N/A;  Laparoscopic Repair Verntal Incisional Hernia and Mesh   Patient Active Problem List   Diagnosis Date Noted   Lumbar compression fracture ([place]) 06/19/2020   Colles' fracture of left radius, initial encounter for closed fracture 05/09/2020   Osteoarthritis of left hip 11/09/2017   S/P hip replacement, left 11/09/2017   Primary osteoarthritis of both hips 09/25/2016   Fibromyalgia 04/28/2016   Primary osteoarthritis of both knees 04/28/2016   History of total knee replacement, bilateral 04/28/2016   Primary osteoarthritis of both hands 04/28/2016   Primary insomnia 04/28/2016  History of sleep apnea 04/28/2016   History of restless legs syndrome 04/28/2016   Osteopenia of multiple sites 04/28/2016   Trochanteric bursitis of left hip 02/03/2016   Hiatal hernia with gastroesophageal reflux 11/06/2015   Hiatal hernia 03/25/2015   Gastroesophageal reflux disease with esophagitis 01/17/2015   Ligamentous laxity of knee 01/05/2011   Knee effusion, right 11/10/2010   Knee pain 11/10/2010   Incisional hernia 10/16/2010   HYPERTENSION 05/14/2010   OBSTRUCTIVE SLEEP APNEA 05/14/2010   Obesity 04/08/2009    PCP: Haywood Pao, MD  REFERRING PROVIDER: Dr Bayard Males DIAG: M51.36 (ICD-10-CM) - DDD (degenerative disc disease), lumbar  Fall  prevention   THERAPY DIAG:  Difficulty in walking, not elsewhere classified  Muscle weakness (generalized)  Muscle spasm of back  ONSET DATE: 04/2020  SUBJECTIVE:                                                                                                                                                                                           SUBJECTIVE STATEMENT: I know PT is helping me.  I can stand longer before I have to sit down.  I have started the [employer] app and I think I will sign up for the app.   PERTINENT HISTORY:  Bil. TKA, Fibromyalgia, closed compression Fx of L2, osteopenia with DEXA 04/29/20  T-score of -1.2 of R hip. Fall Feb 2022 with compression Fx of L2  PAIN:  Are you having pain? Yes NPRS scale: 2/10 Pain location: back, arms/legs-more widespread Pain orientation: Bilateral  PAIN TYPE: aching Pain description: intermittent and aching  Aggravating factors: standing, activity  Relieving factors: rest, stretching   PRECAUTIONS: Fall and Other: Osteoporosis   PATIENT GOALS To relieve the pain in some way and help get better mobility.    OBJECTIVE: from initial evaluation 04/01/21  PATIENT SURVEYS:  FOTO 46 (goal is 52)   POSTURE:  Rounded Shoulders;Forward head    LE MMT: Bil LE strenth of 4/5 grossly throughout   5x sit to stand: 18.5 sec with UE pushing up from thighs  Normal TUG (seconds) 13.1   with SPC     TODAY'S TREATMENT  04/23/21:  Exercise: *PT provided verbal and tactile cues for exercise.  Monitored for fatigue and pain NuStep: Level 3x 8 minutes-PT present to discuss progress LAQs: 2# 2x10 bil each Seated marching: 2# 2x10 Seated shoulder flexion: 1# 2x10 Supine: hip abduction with blue loop 2x10 Supine: horizontal abduction with ab bracing: red band 2x10 Manual:    Addaday: bil lumbar and gluteals with addaday.  Pt in [place]:  Access Code: [email or web address]   ASSESSMENT:  CLINICAL  IMPRESSION: Pt responded well to DN and use of Addaday last session.  Pt reports that she can do more for longer before she needs to sit.  Pt reports 2/10 pain today.  She was able to tolerate LAQs with 2# today.  Pt required min cuing for transversus abdomenus contraction during ther ex, but was able to complete.  Pt is going to work on a pain app to assist with chronic pain ([employer]). Pt continues to require skilled PT to continue to progress towards goal related activities.     GOALS: Goals reviewed with patient? Yes  SHORT TERM GOALS:  STG Name Target Date Goal status  1 Pt will be independent with initial HEP Baseline:  05/02/21 MET        LONG TERM GOALS:   LTG Name Target Date Goal status  1 Pt will be independent with advanced HEP Baseline: 06/20/21 IN PROGRESS  2 Pt will increase FOTO to at least 52% to demonstrate improved functional mobility.    Baseline: 06/20/21 IN PROGRESS  3 Pt will be able to ambulate for greater than 20 minutes with LRAD to allow her to go shopping at a small store.    Baseline:  06/20/21 INITIAL  4 Pt will increase BLE strength to at least 4+/5 to allow her to negotiate steps without difficulty.  Baseline: 4/5 bil LE 06/20/21 INITIAL  5 Pt will have BERG of at least 48/56 to place her at a lower risk of falling.    Baseline: 06/20/21 INITIAL   PLAN: PT FREQUENCY: 2x/week  PT DURATION: 12 weeks  PLANNED INTERVENTIONS: Therapeutic exercises, Therapeutic activity, Neuro Muscular re-education, Balance training, Gait training, Patient/Family education, Joint mobilization, Stair training, Aquatic Therapy, Dry Needling, Electrical stimulation, Spinal mobilization, Cryotherapy, Moist heat, Taping, and Manual therapy  PLAN FOR NEXT SESSION: Core strength, UE/LE strength, manual to address lumbar/gluteals   Sigurd Sos, PT 04/23/21 1:17 PM

## 2021-04-23 ENCOUNTER — Other Ambulatory Visit: Payer: Self-pay

## 2021-04-23 ENCOUNTER — Ambulatory Visit: Payer: Medicare PPO

## 2021-04-23 DIAGNOSIS — M6281 Muscle weakness (generalized): Secondary | ICD-10-CM | POA: Diagnosis not present

## 2021-04-23 DIAGNOSIS — M62830 Muscle spasm of back: Secondary | ICD-10-CM | POA: Diagnosis not present

## 2021-04-23 DIAGNOSIS — R262 Difficulty in walking, not elsewhere classified: Secondary | ICD-10-CM | POA: Diagnosis not present

## 2021-04-30 ENCOUNTER — Other Ambulatory Visit: Payer: Self-pay

## 2021-04-30 ENCOUNTER — Ambulatory Visit: Payer: Medicare PPO

## 2021-04-30 DIAGNOSIS — M6281 Muscle weakness (generalized): Secondary | ICD-10-CM | POA: Diagnosis not present

## 2021-04-30 DIAGNOSIS — R262 Difficulty in walking, not elsewhere classified: Secondary | ICD-10-CM | POA: Diagnosis not present

## 2021-04-30 DIAGNOSIS — M62830 Muscle spasm of back: Secondary | ICD-10-CM | POA: Diagnosis not present

## 2021-04-30 NOTE — Therapy (Signed)
OUTPATIENT PHYSICAL THERAPY TREATMENT    Patient Name: Miranda Romero MRN: [id number] DOB:09-15-1945, 76 y.o., female Today's Date: 04/30/2021   PT End of Session - 04/30/21 1310     Visit Number 7    Date for PT Re-Evaluation 06/20/21    Authorization Type [employer]    Authorization Time Period 12 visits approved 1/17-06/20/21    Authorization - Visit Number 7    Authorization - Number of Visits 12    Progress Note Due on Visit 10    PT Start Time 3810    PT Stop Time 1311    PT Time Calculation (min) 40 min    Activity Tolerance Patient tolerated treatment well    Behavior During Therapy [facility] for tasks assessed/performed              Past Medical History:  Diagnosis Date   Arthritis    Blood transfusion    Compression fracture of L3 vertebra (HCC)    Fibromyalgia    Dr Estanislado Pandy   GERD (gastroesophageal reflux disease)    Hiatal hernia with gastroesophageal reflux 11/06/2015   History of hiatal hernia    Hyperlipidemia    Hypertension    Dr Osborne Casco LOV 4/13 on chart   Seasonal allergies    Sleep apnea    SEVERE  per sleep study 3/12 EPIC no CPAP    Transfusion history    3 yrs ago s/p surgery for knee   Umbilical hernia    Weight decrease    Wrist fracture    Left wrist   Past Surgical History:  Procedure Laterality Date   ANKLE SURGERY     APPENDECTOMY  2014   BALLOON DILATION N/A 06/05/2015   Procedure: BALLOON DILATION;  Surgeon: Arta Silence, MD;  Location: [facility];  Service: Endoscopy;  Laterality: N/A;   CATARACT EXTRACTION Bilateral 05/2013   CHOLECYSTECTOMY     DILATION AND CURETTAGE OF UTERUS     ESOPHAGOGASTRODUODENOSCOPY (EGD) WITH PROPOFOL N/A 06/05/2015   Procedure: ESOPHAGOGASTRODUODENOSCOPY (EGD) WITH PROPOFOL;  Surgeon: Arta Silence, MD;  Location: [facility];  Service: Endoscopy;  Laterality: N/A;   HERNIA REPAIR  05/2013   due to appendectomy with mesh   HIATAL HERNIA REPAIR N/A 11/06/2015   Procedure: LAPAROSCOPIC  REPAIR HIATAL HERNIA, Nissen FUNDOPLICATION;  Surgeon: Fanny Skates, MD;  Location: [facility];  Service: General;  Laterality: N/A;   JOINT REPLACEMENT     KNEE ARTHROPLASTY Bilateral    knee infection Right    knee hardware removed 11/12/   replaced 1/13   spine injection     [place] Left 11/09/2017   Procedure: LEFT TOTAL HIP ARTHROPLASTY;  Surgeon: Garald Balding, MD;  Location: [place];  Service: Orthopedics;  Laterality: Left;   VENTRAL HERNIA REPAIR  07/21/2011   Procedure: LAPAROSCOPIC VENTRAL HERNIA;  Surgeon: Adin Hector, MD;  Location: [facility];  Service: General;  Laterality: N/A;  Laparoscopic Repair Verntal Incisional Hernia and Mesh   Patient Active Problem List   Diagnosis Date Noted   Lumbar compression fracture ([place]) 06/19/2020   Colles' fracture of left radius, initial encounter for closed fracture 05/09/2020   Osteoarthritis of left hip 11/09/2017   S/P hip replacement, left 11/09/2017   Primary osteoarthritis of both hips 09/25/2016   Fibromyalgia 04/28/2016   Primary osteoarthritis of both knees 04/28/2016   History of total knee replacement, bilateral 04/28/2016   Primary osteoarthritis of both hands 04/28/2016   Primary insomnia  04/28/2016   History of sleep apnea 04/28/2016   History of restless legs syndrome 04/28/2016   Osteopenia of multiple sites 04/28/2016   Trochanteric bursitis of left hip 02/03/2016   Hiatal hernia with gastroesophageal reflux 11/06/2015   Hiatal hernia 03/25/2015   Gastroesophageal reflux disease with esophagitis 01/17/2015   Ligamentous laxity of knee 01/05/2011   Knee effusion, right 11/10/2010   Knee pain 11/10/2010   Incisional hernia 10/16/2010   HYPERTENSION 05/14/2010   OBSTRUCTIVE SLEEP APNEA 05/14/2010   Obesity 04/08/2009    PCP: Haywood Pao, MD  REFERRING PROVIDER: Dr Bayard Males DIAG: M51.36 (ICD-10-CM) - DDD (degenerative disc disease), lumbar  Fall  prevention   THERAPY DIAG:  Difficulty in walking, not elsewhere classified  Muscle weakness (generalized)  Muscle spasm of back  ONSET DATE: 04/2020  SUBJECTIVE:                                                                                                                                                                                           SUBJECTIVE STATEMENT: I couldn't come last session because I had nausea related to GERD.    PERTINENT HISTORY:  Bil. TKA, Fibromyalgia, closed compression Fx of L2, osteopenia with DEXA 04/29/20  T-score of -1.2 of R hip. Fall Feb 2022 with compression Fx of L2  PAIN:  Are you having pain? Yes NPRS scale: 3/10 Pain location: back, arms/legs-more widespread Pain orientation: Bilateral  PAIN TYPE: aching Pain description: intermittent and aching  Aggravating factors: standing, activity  Relieving factors: rest, stretching   PRECAUTIONS: Fall and Other: Osteoporosis   PATIENT GOALS To relieve the pain in some way and help get better mobility.    OBJECTIVE: from initial evaluation 04/01/21  PATIENT SURVEYS:  FOTO 46 (goal is 52)   POSTURE:  Rounded Shoulders;Forward head    LE MMT: Bil LE strenth of 4/5 grossly throughout   5x sit to stand: 18.5 sec with UE pushing up from thighs  Normal TUG (seconds) 13.1   with SPC     TODAY'S TREATMENT  04/30/21:  Exercise: *PT provided verbal and tactile cues for exercise.  Monitored for fatigue and pain NuStep: Level 3x 8 minutes-PT present to discuss progress LAQs: 2# 2x10 bil each Seated marching: 2# 2x10 Seated shoulder flexion: 1# 2x10    Trigger Point Dry-Needling  Treatment instructions: Expect mild to moderate muscle soreness. S/S of pneumothorax if dry needled over a lung field, and to seek immediate medical attention should they occur. Patient verbalized understanding of these instructions and education.  Patient Consent Given: Yes Education handout provided: Previously  provided Muscles treated: Bil gluteals,  piriformis and lumbar multifidi Treatment response/outcome: Twitch response elicited and Palpable decrease in muscle tension  PT provided skilled palplation and monitoring during DN.   Elongation and release to bil gluteals and lumbar spine after needling.   04/23/21:  Exercise: *PT provided verbal and tactile cues for exercise.  Monitored for fatigue and pain NuStep: Level 3x 8 minutes-PT present to discuss progress LAQs: 2# 2x10 bil each Seated marching: 2# 2x10 Seated shoulder flexion: 1# 2x10 Supine: hip abduction with blue loop 2x10 Supine: horizontal abduction with ab bracing: red band 2x10 Manual:    Addaday: bil lumbar and gluteals with addaday.  Pt in [place]: Access Code: [email or web address]   ASSESSMENT:  CLINICAL IMPRESSION: Pt had to cancel last session due to extreme reflux.  Pt reports 5% overall improvement since the start of care here. Pt is able to stand and walk about 12 minutes.   Pt is independent and compliant in HEP.   Pt reports that she can do more for longer before she needs to sit.  Pt reports 2/10 pain today.  Pt required min cueing with sit to stand as she was not standing fully or engaging her gluteals with this.    Pt with good response to DN with twitch response and improved tissue mobility after dry needling and manual therapy today. Pt continues to require skilled PT to continue to progress towards goal related activities.     GOALS: Goals reviewed with patient? Yes  SHORT TERM GOALS:  STG Name Target Date Goal status  1 Pt will be independent with initial HEP Baseline:  05/02/21 MET        LONG TERM GOALS:   LTG Name Target Date Goal status  1 Pt will be independent with advanced HEP Baseline: 06/20/21 IN PROGRESS  2 Pt will increase FOTO to at least 52% to demonstrate improved functional mobility.    Baseline: 06/20/21 IN PROGRESS  3 Pt will be able to ambulate for greater than  20 minutes with LRAD to allow her to go shopping at a small store.    Baseline: 12 minutes (04/30/21) 06/20/21 INITIAL  4 Pt will increase BLE strength to at least 4+/5 to allow her to negotiate steps without difficulty.  Baseline: 4/5 bil LE 06/20/21 INITIAL  5 Pt will have BERG of at least 48/56 to place her at a lower risk of falling.    Baseline: 06/20/21 INITIAL   PLAN: PT FREQUENCY: 2x/week  PT DURATION: 12 weeks  PLANNED INTERVENTIONS: Therapeutic exercises, Therapeutic activity, Neuro Muscular re-education, Balance training, Gait training, Patient/Family education, Joint mobilization, Stair training, Aquatic Therapy, Dry Needling, Electrical stimulation, Spinal mobilization, Cryotherapy, Moist heat, Taping, and Manual therapy  PLAN FOR NEXT SESSION: Assess response to DN and continue as helpful. Core strength, UE/LE strength, manual to address lumbar/gluteals   Sigurd Sos, PT 04/30/21 1:13 PM

## 2021-05-05 ENCOUNTER — Ambulatory Visit: Payer: Medicare PPO

## 2021-05-05 ENCOUNTER — Other Ambulatory Visit: Payer: Self-pay

## 2021-05-05 DIAGNOSIS — M6281 Muscle weakness (generalized): Secondary | ICD-10-CM | POA: Diagnosis not present

## 2021-05-05 DIAGNOSIS — R262 Difficulty in walking, not elsewhere classified: Secondary | ICD-10-CM | POA: Diagnosis not present

## 2021-05-05 DIAGNOSIS — M62830 Muscle spasm of back: Secondary | ICD-10-CM | POA: Diagnosis not present

## 2021-05-05 NOTE — Therapy (Signed)
OUTPATIENT PHYSICAL THERAPY TREATMENT    Patient Name: Miranda Romero MRN: [id number] DOB:1946/01/27, 76 y.o., female Today's Date: 05/05/2021   PT End of Session - 05/05/21 1301     Visit Number 8    Date for PT Re-Evaluation 06/20/21    Authorization Type [employer]    Authorization Time Period 12 visits approved 1/17-06/20/21    Authorization - Visit Number 8    Authorization - Number of Visits 12    Progress Note Due on Visit 10    PT Start Time 1233    PT Stop Time 1313    PT Time Calculation (min) 40 min    Activity Tolerance Patient tolerated treatment well    Behavior During Therapy [facility] for tasks assessed/performed               Past Medical History:  Diagnosis Date   Arthritis    Blood transfusion    Compression fracture of L3 vertebra (HCC)    Fibromyalgia    Dr Estanislado Pandy   GERD (gastroesophageal reflux disease)    Hiatal hernia with gastroesophageal reflux 11/06/2015   History of hiatal hernia    Hyperlipidemia    Hypertension    Dr Osborne Casco LOV 4/13 on chart   Seasonal allergies    Sleep apnea    SEVERE  per sleep study 3/12 EPIC no CPAP    Transfusion history    3 yrs ago s/p surgery for knee   Umbilical hernia    Weight decrease    Wrist fracture    Left wrist   Past Surgical History:  Procedure Laterality Date   ANKLE SURGERY     APPENDECTOMY  2014   BALLOON DILATION N/A 06/05/2015   Procedure: BALLOON DILATION;  Surgeon: Arta Silence, MD;  [facility];  Service: Endoscopy;  Laterality: N/A;   CATARACT EXTRACTION Bilateral 05/2013   CHOLECYSTECTOMY     DILATION AND CURETTAGE OF UTERUS     ESOPHAGOGASTRODUODENOSCOPY (EGD) WITH PROPOFOL N/A 06/05/2015   Procedure: ESOPHAGOGASTRODUODENOSCOPY (EGD) WITH PROPOFOL;  Surgeon: Arta Silence, MD;  [facility];  Service: Endoscopy;  Laterality: N/A;   HERNIA REPAIR  05/2013   due to appendectomy with mesh   HIATAL HERNIA REPAIR N/A 11/06/2015   Procedure:  LAPAROSCOPIC REPAIR HIATAL HERNIA, Nissen FUNDOPLICATION;  Surgeon: Fanny Skates, MD;  [facility];  Service: General;  Laterality: N/A;   JOINT REPLACEMENT     KNEE ARTHROPLASTY Bilateral    knee infection Right    knee hardware removed 11/12/   replaced 1/13   spine injection     [place] Left 11/09/2017   Procedure: LEFT TOTAL HIP ARTHROPLASTY;  Surgeon: Garald Balding, MD;  Location: [place];  Service: Orthopedics;  Laterality: Left;   VENTRAL HERNIA REPAIR  07/21/2011   Procedure: LAPAROSCOPIC VENTRAL HERNIA;  Surgeon: Adin Hector, MD;  Location: [facility];  Service: General;  Laterality: N/A;  Laparoscopic Repair Verntal Incisional Hernia and Mesh   Patient Active Problem List   Diagnosis Date Noted   Lumbar compression fracture ([place]) 06/19/2020   Colles' fracture of left radius, initial encounter for closed fracture 05/09/2020   Osteoarthritis of left hip 11/09/2017   S/P hip replacement, left 11/09/2017   Primary osteoarthritis of both hips 09/25/2016   Fibromyalgia 04/28/2016   Primary osteoarthritis of both knees 04/28/2016   History of total knee replacement, bilateral 04/28/2016   Primary osteoarthritis of both hands 04/28/2016   Primary  insomnia 04/28/2016   History of sleep apnea 04/28/2016   History of restless legs syndrome 04/28/2016   Osteopenia of multiple sites 04/28/2016   Trochanteric bursitis of left hip 02/03/2016   Hiatal hernia with gastroesophageal reflux 11/06/2015   Hiatal hernia 03/25/2015   Gastroesophageal reflux disease with esophagitis 01/17/2015   Ligamentous laxity of knee 01/05/2011   Knee effusion, right 11/10/2010   Knee pain 11/10/2010   Incisional hernia 10/16/2010   HYPERTENSION 05/14/2010   OBSTRUCTIVE SLEEP APNEA 05/14/2010   Obesity 04/08/2009    PCP: Haywood Pao, MD  REFERRING PROVIDER: Dr Bayard Males DIAG: M51.36 (ICD-10-CM) - DDD (degenerative disc disease), lumbar   Fall prevention   THERAPY DIAG:  Difficulty in walking, not elsewhere classified  Muscle weakness (generalized)  Muscle spasm of back  ONSET DATE: 04/2020  SUBJECTIVE:                                                                                                                                                                                           SUBJECTIVE STATEMENT: I had an OK weekend.   I was sore after DN, not too bad though.  I will do DN again on Wednesday.   PERTINENT HISTORY:  Bil. TKA, Fibromyalgia, closed compression Fx of L2, osteopenia with DEXA 04/29/20  T-score of -1.2 of R hip. Fall Feb 2022 with compression Fx of L2  PAIN:  Are you having pain? Yes NPRS scale: 5/10 Pain location: back, arms/legs-more widespread Pain orientation: Bilateral  PAIN TYPE: aching Pain description: intermittent and aching  Aggravating factors: standing, activity  Relieving factors: rest, stretching   PRECAUTIONS: Fall and Other: Osteoporosis  PATIENT GOALS To relieve the pain in some way and help get better mobility.    OBJECTIVE: from initial evaluation 04/01/21  PATIENT SURVEYS:  FOTO 46 (goal is 52) POSTURE:  Rounded Shoulders;Forward head  LE MMT: Bil LE strenth of 4/5 grossly throughout   5x sit to stand: 18.5 sec with UE pushing up from thighs  Normal TUG (seconds) 13.1   with SPC    TODAY'S TREATMENT  05/05/21:  Exercise: *PT provided verbal and tactile cues for exercise.  Monitored for fatigue and pain NuStep: Level 3x 8 minutes-PT present to discuss progress LAQs: 2# 2x10 bil each Seated marching: 2# 2x10 Seated shoulder flexion: 1# 2x10 Supine: hip abduction with red loop 2x10,  Supine: horizontal abduction and ER with ab bracing: red band 2x10 Manual:    Addaday: bil lumbar and gluteals with addaday.  Pt in [place].  04/30/21:  Exercise: *PT provided verbal and tactile cues for exercise.  Monitored for fatigue  and pain NuStep: Level 3x 8 minutes-PT  present to discuss progress LAQs: 2# 2x10 bil each Seated marching: 2# 2x10 Seated shoulder flexion: 1# 2x10    Trigger Point Dry-Needling  Treatment instructions: Expect mild to moderate muscle soreness. S/S of pneumothorax if dry needled over a lung field, and to seek immediate medical attention should they occur. Patient verbalized understanding of these instructions and education.  Patient Consent Given: Yes Education handout provided: Previously provided Muscles treated: Bil gluteals, piriformis and lumbar multifidi Treatment response/outcome: Twitch response elicited and Palpable decrease in muscle tension  PT provided skilled palplation and monitoring during DN.   Elongation and release to bil gluteals and lumbar spine after needling.   04/23/21:  Exercise: *PT provided verbal and tactile cues for exercise.  Monitored for fatigue and pain NuStep: Level 3x 8 minutes-PT present to discuss progress LAQs: 2# 2x10 bil each Seated marching: 2# 2x10 Seated shoulder flexion: 1# 2x10 Supine: hip abduction with blue loop 2x10 Supine: horizontal abduction with ab bracing: red band 2x10 Manual:    Addaday: bil lumbar and gluteals with addaday.  Pt in [place]: PATIENT EDUCATION: Education details: Access Code: [email or web address] Person educated: Patient Education method: Customer service manager Education comprehension: verbalized understanding and returned demonstration  Access Code: [email or web address] URL: [email or web address] Date: 05/05/2021 Supine Shoulder Horizontal Abduction with Resistance - 2 x daily - 7 x weekly - 10 reps - 2 sets Supine Bilateral Shoulder External Rotation with Resistance - 2 x daily - 7 x weekly - 10 reps - 2 sets   ASSESSMENT:  CLINICAL IMPRESSION: Pt continues 5% overall improvement since the start of care here. Pt is able to stand and walk about 12 minutes overall.   Pt is independent and compliant in HEP.   Pt  reports that she can do more for longer before she needs to sit.  Pt did well with exercise today and supine theraband with ab bracing was added to HEP.  Pt continues to require skilled PT to continue to progress towards goal related activities.     GOALS: Goals reviewed with patient? Yes  SHORT TERM GOALS:  STG Name Target Date Goal status  1 Pt will be independent with initial HEP Baseline:  05/02/21 MET        LONG TERM GOALS:   LTG Name Target Date Goal status  1 Pt will be independent with advanced HEP Baseline: 06/20/21 IN PROGRESS  2 Pt will increase FOTO to at least 52% to demonstrate improved functional mobility.    Baseline: 06/20/21 IN PROGRESS  3 Pt will be able to ambulate for greater than 20 minutes with LRAD to allow her to go shopping at a small store.    Baseline: 12 minutes (04/30/21) 06/20/21 INITIAL  4 Pt will increase BLE strength to at least 4+/5 to allow her to negotiate steps without difficulty.  Baseline: 4/5 bil LE 06/20/21 INITIAL  5 Pt will have BERG of at least 48/56 to place her at a lower risk of falling.    Baseline: 06/20/21 INITIAL   PLAN: PT FREQUENCY: 2x/week  PT DURATION: 12 weeks  PLANNED INTERVENTIONS: Therapeutic exercises, Therapeutic activity, Neuro Muscular re-education, Balance training, Gait training, Patient/Family education, Joint mobilization, Stair training, Aquatic Therapy, Dry Needling, Electrical stimulation, Spinal mobilization, Cryotherapy, Moist heat, Taping, and Manual therapy  PLAN FOR NEXT SESSION: DN to gluteals and lumbar spine. Core strength, UE/LE strength, manual to address lumbar/gluteals   Sigurd Sos, PT 05/05/21  1:16 PM

## 2021-05-06 ENCOUNTER — Other Ambulatory Visit: Payer: Self-pay | Admitting: Physician Assistant

## 2021-05-06 NOTE — Telephone Encounter (Signed)
Next Visit: 07/09/2021  Last Visit: 01/29/2021  Last Fill: 08/06/2020  Dx: Fibromyalgia  Current Dose per office note on 01/29/2021: not discussed  Okay to refill Gabapentin?

## 2021-05-07 ENCOUNTER — Other Ambulatory Visit: Payer: Self-pay

## 2021-05-07 ENCOUNTER — Ambulatory Visit: Payer: Medicare PPO

## 2021-05-07 DIAGNOSIS — M62830 Muscle spasm of back: Secondary | ICD-10-CM | POA: Diagnosis not present

## 2021-05-07 DIAGNOSIS — M6281 Muscle weakness (generalized): Secondary | ICD-10-CM | POA: Diagnosis not present

## 2021-05-07 DIAGNOSIS — R262 Difficulty in walking, not elsewhere classified: Secondary | ICD-10-CM | POA: Diagnosis not present

## 2021-05-07 NOTE — Therapy (Signed)
°OUTPATIENT PHYSICAL THERAPY TREATMENT  ° ° °Patient Name: Miranda Romero °MRN: [id number] °DOB:04/16/1945, 76 y.o., female °Today's Date: 05/07/2021 ° ° PT End of Session - 05/07/21 1307   ° ° Visit Number 9   ° Date for PT Re-Evaluation 06/20/21   ° Authorization Type [employer]   ° Authorization Time Period 12 visits approved 1/17-06/20/21   ° Authorization - Visit Number 9   ° Authorization - Number of Visits 12   ° Progress Note Due on Visit 10   ° PT Start Time 1231   ° PT Stop Time 1309   ° PT Time Calculation (min) 38 min   ° Activity Tolerance Patient tolerated treatment well   ° Behavior During Therapy WFL for tasks assessed/performed   ° °  °  ° °  ° ° ° ° ° °Past Medical History:  °Diagnosis Date  ° Arthritis   ° Blood transfusion   ° Compression fracture of L3 vertebra (HCC)   ° Fibromyalgia   ° Dr Deveshwar  ° GERD (gastroesophageal reflux disease)   ° Hiatal hernia with gastroesophageal reflux 11/06/2015  ° History of hiatal hernia   ° Hyperlipidemia   ° Hypertension   ° Dr Tisovec LOV 4/13 on chart  ° Seasonal allergies   ° Sleep apnea   ° SEVERE  per sleep study 3/12 EPIC no CPAP   ° Transfusion history   ° 3 yrs ago s/p surgery for knee  ° Umbilical hernia   ° Weight decrease   ° Wrist fracture   ° Left wrist  ° °Past Surgical History:  °Procedure Laterality Date  ° ANKLE SURGERY    ° APPENDECTOMY  2014  ° BALLOON DILATION N/A 06/05/2015  ° Procedure: BALLOON DILATION;  Surgeon: William Outlaw, MD;  Location: [facility];  Service: Endoscopy;  Laterality: N/A;  ° CATARACT EXTRACTION Bilateral 05/2013  ° CHOLECYSTECTOMY    ° DILATION AND CURETTAGE OF UTERUS    ° ESOPHAGOGASTRODUODENOSCOPY (EGD) WITH PROPOFOL N/A 06/05/2015  ° Procedure: ESOPHAGOGASTRODUODENOSCOPY (EGD) WITH PROPOFOL;  Surgeon: William Outlaw, MD;  Location: [facility];  Service: Endoscopy;  Laterality: N/A;  ° HERNIA REPAIR  05/2013  ° due to appendectomy with mesh  ° HIATAL HERNIA REPAIR N/A 11/06/2015  ° Procedure:  LAPAROSCOPIC REPAIR HIATAL HERNIA, Nissen FUNDOPLICATION;  Surgeon: Haywood Ingram, MD;  Location: [facility];  Service: General;  Laterality: N/A;  ° JOINT REPLACEMENT    ° KNEE ARTHROPLASTY Bilateral   ° knee infection Right   ° knee hardware removed 11/12/   replaced 1/13  ° spine injection    ° [facility]  ° TOTAL HIP ARTHROPLASTY Left 11/09/2017  ° Procedure: LEFT TOTAL HIP ARTHROPLASTY;  Surgeon: Whitfield, Peter W, MD;  Location: [facility];  Service: Orthopedics;  Laterality: Left;  ° VENTRAL HERNIA REPAIR  07/21/2011  ° Procedure: LAPAROSCOPIC VENTRAL HERNIA;  Surgeon: Haywood M Ingram, MD;  Location: [facility];  Service: General;  Laterality: N/A;  Laparoscopic Repair Verntal Incisional Hernia and Mesh  ° °Patient Active Problem List  ° Diagnosis Date Noted  ° Lumbar compression fracture (HCC) 06/19/2020  ° Colles' fracture of left radius, initial encounter for closed fracture 05/09/2020  ° Osteoarthritis of left hip 11/09/2017  ° S/P hip replacement, left 11/09/2017  ° Primary osteoarthritis of both hips 09/25/2016  ° Fibromyalgia 04/28/2016  ° Primary osteoarthritis of both knees 04/28/2016  ° History of total knee replacement, bilateral 04/28/2016  ° Primary osteoarthritis of both hands 04/28/2016  °   Primary insomnia 04/28/2016  ° History of sleep apnea 04/28/2016  ° History of restless legs syndrome 04/28/2016  ° Osteopenia of multiple sites 04/28/2016  ° Trochanteric bursitis of left hip 02/03/2016  ° Hiatal hernia with gastroesophageal reflux 11/06/2015  ° Hiatal hernia 03/25/2015  ° Gastroesophageal reflux disease with esophagitis 01/17/2015  ° Ligamentous laxity of knee 01/05/2011  ° Knee effusion, right 11/10/2010  ° Knee pain 11/10/2010  ° Incisional hernia 10/16/2010  ° HYPERTENSION 05/14/2010  ° OBSTRUCTIVE SLEEP APNEA 05/14/2010  ° Obesity 04/08/2009  ° ° °PCP: Tisovec, Richard W, MD ° °REFERRING PROVIDER: Dr Deveshwar ° °REFERRING DIAG: M51.36 (ICD-10-CM) - DDD (degenerative disc disease), lumbar   Fall prevention  ° °THERAPY DIAG:  °Difficulty in walking, not elsewhere classified ° °Muscle weakness (generalized) ° °Muscle spasm of back ° °ONSET DATE: 04/2020 ° °SUBJECTIVE:                                                                                                                                                                                          ° °SUBJECTIVE STATEMENT: °I had an OK weekend.   I want to do dry needling today.  I was able to do the theraband exercises without any problem.  ° °PERTINENT HISTORY:  °Bil. TKA, Fibromyalgia, closed compression Fx of L2, osteopenia with DEXA 04/29/20  T-score of -1.2 of R hip. Fall Feb 2022 with compression Fx of L2 ° °PAIN:  °Are you having pain? Yes °NPRS scale: 5-6/10 °Pain location: back °Pain orientation: Bilateral  °PAIN TYPE: aching °Pain description: intermittent and aching  °Aggravating factors: standing, activity  °Relieving factors: rest, stretching  ° °PRECAUTIONS: Fall and Other: Osteoporosis ° °PATIENT GOALS To relieve the pain in some way and help get better mobility.  ° ° °OBJECTIVE: from initial evaluation 04/01/21 ° °PATIENT SURVEYS:  °FOTO 46 (goal is 52) °POSTURE:  °Rounded Shoulders;Forward head  °LE MMT: °Bil LE strenth of 4/5 grossly throughout  ° °5x sit to stand: 18.5 sec with UE pushing up from thighs  °Normal TUG (seconds) 13.1   with SPC  ° ° °TODAY'S TREATMENT  °05/07/21:  °Exercise: *PT provided verbal and tactile cues for exercise.  Monitored for fatigue and pain °NuStep: Level 3x 8 minutes-PT present to discuss progress °LAQs: 2# 2x10 bil each °Seated marching: 2# 2x10 °Supine: horizontal abduction and ER with ab bracing: red band 2x10 °Trigger Point Dry-Needling  °Treatment instructions: Expect mild to moderate muscle soreness. S/S of pneumothorax if dry needled over a lung field, and to seek immediate medical attention should they occur. Patient verbalized understanding of these instructions and education. ° °Patient Consent  Given:   Yes °Education handout provided: Previously provided °Muscles treated: Bil gluteals, piriformis and lumbar multifidi °Treatment response/outcome: Twitch response elicited and Palpable decrease in muscle tension  °Manual: PT provided skilled palplation and monitoring during DN.   °Elongation and release to bil gluteals and lumbar spine after needling. ° °05/05/21:  °Exercise: *PT provided verbal and tactile cues for exercise.  Monitored for fatigue and pain °NuStep: Level 3x 8 minutes-PT present to discuss progress °LAQs: 2# 2x10 bil each °Seated marching: 2# 2x10 °Seated shoulder flexion: 1# 2x10 °Supine: hip abduction with red loop 2x10,  °Supine: horizontal abduction and ER with ab bracing: red band 2x10 °Manual:  °  Addaday: bil lumbar and gluteals with addaday.  Pt in Rt sidelying.  °04/30/21:  °Exercise: *PT provided verbal and tactile cues for exercise.  Monitored for fatigue and pain °NuStep: Level 3x 8 minutes-PT present to discuss progress °LAQs: 2# 2x10 bil each °Seated marching: 2# 2x10 °Seated shoulder flexion: 1# 2x10 ° °  Trigger Point Dry-Needling  °Treatment instructions: Expect mild to moderate muscle soreness. S/S of pneumothorax if dry needled over a lung field, and to seek immediate medical attention should they occur. Patient verbalized understanding of these instructions and education. ° °Patient Consent Given: Yes °Education handout provided: Previously provided °Muscles treated: Bil gluteals, piriformis and lumbar multifidi °Treatment response/outcome: Twitch response elicited and Palpable decrease in muscle tension  °PT provided skilled palplation and monitoring during DN.   °Elongation and release to bil gluteals and lumbar spine after needling. ° ° °HOME EXERCISE PROGRAM: °PATIENT EDUCATION: °Education details: Access Code: [email or web address] °Person educated: Patient °Education method: Explanation and Demonstration °Education comprehension: verbalized understanding and returned  demonstration ° °Access Code: [email or web address] °URL: [email or web address] °Date: 05/05/2021 °Supine Shoulder Horizontal Abduction with Resistance - 2 x daily - 7 x weekly - 10 reps - 2 sets °Supine Bilateral Shoulder External Rotation with Resistance - 2 x daily - 7 x weekly - 10 reps - 2 sets ° ° °ASSESSMENT: ° °CLINICAL IMPRESSION: °Pt continues to report baseline pain levels of 5-6/10 at rest.  She reports 8/10 LBP with standing and walking, down from 9/10 initially.  Pt is independent and compliant in HEP.   Pt reports that she can do more for longer before she needs to sit.  Pt was able to demonstrate supine theraband with ab bracing correctly today. Pt with good response to DN with multiple twitch response and improved tissue mobility after DN today.  Pt continues to require skilled PT to continue to progress towards goal related activities. ° ° °GOALS: °Goals reviewed with patient? Yes ° °SHORT TERM GOALS: ° °STG Name Target Date Goal status  °1 Pt will be independent with initial HEP °Baseline:  05/02/21 MET  °     ° °LONG TERM GOALS:  ° °LTG Name Target Date Goal status  °1 Pt will be independent with advanced HEP °Baseline: 06/20/21 IN PROGRESS  °2 Pt will increase FOTO to at least 52% to demonstrate improved functional mobility.   ° °Baseline: 06/20/21 IN PROGRESS  °3 Pt will be able to ambulate for greater than 20 minutes with LRAD to allow her to go shopping at a small store.   ° °Baseline: 12 minutes (04/30/21) 06/20/21 INITIAL  °4 Pt will increase BLE strength to at least 4+/5 to allow her to negotiate steps without difficulty.  °Baseline: 4/5 bil LE 06/20/21 INITIAL  °5 Pt will have BERG of at least 48/56 to place her at a lower risk of falling.   ° °  Baseline: 06/20/21 INITIAL   PLAN: PT FREQUENCY: 2x/week  PT DURATION: 12 weeks  PLANNED INTERVENTIONS: Therapeutic exercises, Therapeutic activity, Neuro Muscular re-education, Balance training, Gait training, Patient/Family education, Joint  mobilization, Stair training, Aquatic Therapy, Dry Needling, Electrical stimulation, Spinal mobilization, Cryotherapy, Moist heat, Taping, and Manual therapy  PLAN FOR NEXT SESSION: DN to gluteals and lumbar spine. Core strength, UE/LE strength. 10th visit next.  Sigurd Sos, PT 05/07/21 1:13 PM

## 2021-05-11 ENCOUNTER — Other Ambulatory Visit: Payer: Self-pay | Admitting: Physician Assistant

## 2021-05-12 NOTE — Telephone Encounter (Signed)
Next Visit: 07/09/2021   Last Visit: 01/29/2021   Last Fill: 10/07/2020  Dx: Fibromyalgia   Current Dose per office note on 01/29/2021: not discussed  Okay to refill Cymbalta?

## 2021-05-13 DIAGNOSIS — I129 Hypertensive chronic kidney disease with stage 1 through stage 4 chronic kidney disease, or unspecified chronic kidney disease: Secondary | ICD-10-CM | POA: Diagnosis not present

## 2021-05-13 DIAGNOSIS — Z0000 Encounter for general adult medical examination without abnormal findings: Secondary | ICD-10-CM | POA: Diagnosis not present

## 2021-05-13 DIAGNOSIS — N182 Chronic kidney disease, stage 2 (mild): Secondary | ICD-10-CM | POA: Diagnosis not present

## 2021-05-13 DIAGNOSIS — K219 Gastro-esophageal reflux disease without esophagitis: Secondary | ICD-10-CM | POA: Diagnosis not present

## 2021-05-13 DIAGNOSIS — M810 Age-related osteoporosis without current pathological fracture: Secondary | ICD-10-CM | POA: Diagnosis not present

## 2021-05-13 DIAGNOSIS — R7301 Impaired fasting glucose: Secondary | ICD-10-CM | POA: Diagnosis not present

## 2021-05-13 DIAGNOSIS — E7800 Pure hypercholesterolemia, unspecified: Secondary | ICD-10-CM | POA: Diagnosis not present

## 2021-05-14 ENCOUNTER — Other Ambulatory Visit: Payer: Self-pay

## 2021-05-14 ENCOUNTER — Ambulatory Visit: Payer: Medicare PPO | Attending: Rheumatology

## 2021-05-14 DIAGNOSIS — M62830 Muscle spasm of back: Secondary | ICD-10-CM | POA: Diagnosis not present

## 2021-05-14 DIAGNOSIS — N182 Chronic kidney disease, stage 2 (mild): Secondary | ICD-10-CM | POA: Diagnosis not present

## 2021-05-14 DIAGNOSIS — R262 Difficulty in walking, not elsewhere classified: Secondary | ICD-10-CM | POA: Diagnosis not present

## 2021-05-14 DIAGNOSIS — M6281 Muscle weakness (generalized): Secondary | ICD-10-CM | POA: Insufficient documentation

## 2021-05-14 DIAGNOSIS — N318 Other neuromuscular dysfunction of bladder: Secondary | ICD-10-CM | POA: Diagnosis not present

## 2021-05-14 DIAGNOSIS — Z0000 Encounter for general adult medical examination without abnormal findings: Secondary | ICD-10-CM | POA: Diagnosis not present

## 2021-05-14 DIAGNOSIS — I129 Hypertensive chronic kidney disease with stage 1 through stage 4 chronic kidney disease, or unspecified chronic kidney disease: Secondary | ICD-10-CM | POA: Diagnosis not present

## 2021-05-14 DIAGNOSIS — M353 Polymyalgia rheumatica: Secondary | ICD-10-CM | POA: Diagnosis not present

## 2021-05-14 DIAGNOSIS — M545 Low back pain, unspecified: Secondary | ICD-10-CM | POA: Diagnosis not present

## 2021-05-14 DIAGNOSIS — M797 Fibromyalgia: Secondary | ICD-10-CM | POA: Diagnosis not present

## 2021-05-14 DIAGNOSIS — E7800 Pure hypercholesterolemia, unspecified: Secondary | ICD-10-CM | POA: Diagnosis not present

## 2021-05-14 DIAGNOSIS — M810 Age-related osteoporosis without current pathological fracture: Secondary | ICD-10-CM | POA: Diagnosis not present

## 2021-05-14 NOTE — Therapy (Signed)
OUTPATIENT PHYSICAL THERAPY TREATMENT    Patient Name: Miranda Romero MRN: [id number] DOB:1945/05/04, 76 y.o., female Today's Date: 05/14/2021  Progress Note Reporting Period 03/26/21 to 05/14/21  See note below for Objective Data and Assessment of Progress/Goals.     PT End of Session - 05/14/21 1315     Visit Number 10    Date for PT Re-Evaluation 06/20/21    Authorization Type [employer]    Authorization Time Period 12 visits approved 1/17-06/20/21    Authorization - Visit Number 10    Authorization - Number of Visits 12    Progress Note Due on Visit 12    PT Start Time 1230    PT Stop Time 1316    PT Time Calculation (min) 46 min    Activity Tolerance Patient tolerated treatment well    Behavior During Therapy WFL for tasks assessed/performed                  Past Medical History:  Diagnosis Date   Arthritis    Blood transfusion    Compression fracture of L3 vertebra (HCC)    Fibromyalgia    Dr Estanislado Pandy   GERD (gastroesophageal reflux disease)    Hiatal hernia with gastroesophageal reflux 11/06/2015   History of hiatal hernia    Hyperlipidemia    Hypertension    Dr Osborne Casco LOV 4/13 on chart   Seasonal allergies    Sleep apnea    SEVERE  per sleep study 3/12 EPIC no CPAP    Transfusion history    3 yrs ago s/p surgery for knee   Umbilical hernia    Weight decrease    Wrist fracture    Left wrist   Past Surgical History:  Procedure Laterality Date   ANKLE SURGERY     APPENDECTOMY  2014   BALLOON DILATION N/A 06/05/2015   Procedure: BALLOON DILATION;  Surgeon: Arta Silence, MD;  Location: [facility];  Service: Endoscopy;  Laterality: N/A;   CATARACT EXTRACTION Bilateral 05/2013   CHOLECYSTECTOMY     DILATION AND CURETTAGE OF UTERUS     ESOPHAGOGASTRODUODENOSCOPY (EGD) WITH PROPOFOL N/A 06/05/2015   Procedure: ESOPHAGOGASTRODUODENOSCOPY (EGD) WITH PROPOFOL;  Surgeon: Arta Silence, MD;  Location: [facility];  Service: Endoscopy;   Laterality: N/A;   HERNIA REPAIR  05/2013   due to appendectomy with mesh   HIATAL HERNIA REPAIR N/A 11/06/2015   Procedure: LAPAROSCOPIC REPAIR HIATAL HERNIA, Nissen FUNDOPLICATION;  Surgeon: Fanny Skates, MD;  Location: [facility];  Service: General;  Laterality: N/A;   JOINT REPLACEMENT     KNEE ARTHROPLASTY Bilateral    knee infection Right    knee hardware removed 11/12/   replaced 1/13   spine injection     [place] Left 11/09/2017   Procedure: LEFT TOTAL HIP ARTHROPLASTY;  Surgeon: Garald Balding, MD;  Location: [facility];  Service: Orthopedics;  Laterality: Left;   VENTRAL HERNIA REPAIR  07/21/2011   Procedure: LAPAROSCOPIC VENTRAL HERNIA;  Surgeon: Adin Hector, MD;  Location: [facility];  Service: General;  Laterality: N/A;  Laparoscopic Repair Verntal Incisional Hernia and Mesh   Patient Active Problem List   Diagnosis Date Noted   Lumbar compression fracture ([place]) 06/19/2020   Colles' fracture of left radius, initial encounter for closed fracture 05/09/2020   Osteoarthritis of left hip 11/09/2017   S/P hip replacement, left 11/09/2017   Primary osteoarthritis of both hips 09/25/2016   Fibromyalgia 04/28/2016   Primary osteoarthritis  of both knees 04/28/2016   History of total knee replacement, bilateral 04/28/2016   Primary osteoarthritis of both hands 04/28/2016   Primary insomnia 04/28/2016   History of sleep apnea 04/28/2016   History of restless legs syndrome 04/28/2016   Osteopenia of multiple sites 04/28/2016   Trochanteric bursitis of left hip 02/03/2016   Hiatal hernia with gastroesophageal reflux 11/06/2015   Hiatal hernia 03/25/2015   Gastroesophageal reflux disease with esophagitis 01/17/2015   Ligamentous laxity of knee 01/05/2011   Knee effusion, right 11/10/2010   Knee pain 11/10/2010   Incisional hernia 10/16/2010   HYPERTENSION 05/14/2010   OBSTRUCTIVE SLEEP APNEA 05/14/2010   Obesity 04/08/2009    PCP: Haywood Pao, MD  REFERRING PROVIDER: Dr Bayard Males DIAG: M51.36 (ICD-10-CM) - DDD (degenerative disc disease), lumbar  Fall prevention   THERAPY DIAG:  Difficulty in walking, not elsewhere classified  Muscle weakness (generalized)  Muscle spasm of back  ONSET DATE: 04/2020  SUBJECTIVE:                                                                                                                                                                                           SUBJECTIVE STATEMENT: I don't feel terrible.  I talked to my doctor on an e-visit today.   I feel 5-10% better overall.  I feel stronger and moving easier.    PERTINENT HISTORY:  Bil. TKA, Fibromyalgia, closed compression Fx of L2, osteopenia with DEXA 04/29/20  T-score of -1.2 of R hip. Fall Feb 2022 with compression Fx of L2  PAIN:  Are you having pain? Yes NPRS scale: 3/10 Pain location: back Pain orientation: Bilateral  PAIN TYPE: aching Pain description: intermittent and aching  Aggravating factors: standing, activity  Relieving factors: rest, stretching   PRECAUTIONS: Fall and Other: Osteoporosis  PATIENT GOALS To relieve the pain in some way and help get better mobility.    OBJECTIVE: from initial evaluation 04/01/21  PATIENT SURVEYS:  FOTO 46 (goal is 52) POSTURE:  Rounded Shoulders;Forward head  LE MMT: Bil LE strenth of 4/5 grossly throughout   5x sit to stand: 18.5 sec with UE pushing up from thighs  Normal TUG (seconds) 13.1   with SPC  Objective: 05/14/21 TUG: 12.3 seconds 5x sit to stand: 12.81 seconds  FOTO: 36 (declined from eval-46 at evaluation)  TODAY'S TREATMENT  05/14/21:  Exercise: *PT provided verbal and tactile cues for exercise.  Monitored for fatigue and pain NuStep: Level 3x 6 minutes-PT present to discuss progress LAQs: 2# 2x10 bil each Seated marching: 2# 2x10  Trigger Point Dry-Needling  Treatment instructions: Expect mild to moderate  muscle soreness. S/S of  pneumothorax if dry needled over a lung field, and to seek immediate medical attention should they occur. Patient verbalized understanding of these instructions and education.  Patient Consent Given: Yes Education handout provided: Previously provided Muscles treated: Bil gluteals, piriformis and lumbar multifidi Treatment response/outcome: Twitch response elicited and Palpable decrease in muscle tension  Manual: PT provided skilled palplation and monitoring during DN.   Elongation and release to bil gluteals and lumbar spine after needling. 05/07/21:  Exercise: *PT provided verbal and tactile cues for exercise.  Monitored for fatigue and pain NuStep: Level 3x 8 minutes-PT present to discuss progress LAQs: 2# 2x10 bil each Seated marching: 2# 2x10 Supine: horizontal abduction and ER with ab bracing: red band 2x10 Trigger Point Dry-Needling  Treatment instructions: Expect mild to moderate muscle soreness. S/S of pneumothorax if dry needled over a lung field, and to seek immediate medical attention should they occur. Patient verbalized understanding of these instructions and education.  Patient Consent Given: Yes Education handout provided: Previously provided Muscles treated: Bil gluteals, piriformis and lumbar multifidi Treatment response/outcome: Twitch response elicited and Palpable decrease in muscle tension  Manual: PT provided skilled palplation and monitoring during DN.   Elongation and release to bil gluteals and lumbar spine after needling.  05/05/21:  Exercise: *PT provided verbal and tactile cues for exercise.  Monitored for fatigue and pain NuStep: Level 3x 8 minutes-PT present to discuss progress LAQs: 2# 2x10 bil each Seated marching: 2# 2x10 Seated shoulder flexion: 1# 2x10 Supine: hip abduction with red loop 2x10,  Supine: horizontal abduction and ER with ab bracing: red band 2x10 Manual:    Addaday: bil lumbar and gluteals with addaday.  Pt in Rt sidelying.   HOME  EXERCISE PROGRAM: PATIENT EDUCATION: Education details: Access Code: [email or web address] Person educated: Patient Education method: Customer service manager Education comprehension: verbalized understanding and returned demonstration  Access Code: [email or web address] URL: [email or web address] Date: 05/05/2021 Supine Shoulder Horizontal Abduction with Resistance - 2 x daily - 7 x weekly - 10 reps - 2 sets Supine Bilateral Shoulder External Rotation with Resistance - 2 x daily - 7 x weekly - 10 reps - 2 sets   ASSESSMENT:  CLINICAL IMPRESSION: Pt reports 5-10% overall improvement in symptoms since the start of care.  Pt reports that she feels stronger and moves better overall.  Pt remains limited to walking 10-12 minutes and is limited by pain.  5x sit to stand and TUG are both improved, indicating improved safety with mobility.  Pt with chronic condition so progress has been slow.  Pt with good twitch response with DN and improved tissue mobility after manual therapy.  Pt will continue to benefit from skilled PT to improve function and reduce pain.   GOALS: Goals reviewed with patient? Yes  SHORT TERM GOALS:  STG Name Target Date Goal status  1 Pt will be independent with initial HEP Baseline:  05/02/21 MET        LONG TERM GOALS:   LTG Name Target Date Goal status  1 Pt will be independent with advanced HEP Baseline: independent and compliant in current HEP 06/20/21 IN PROGRESS  2 Pt will increase FOTO to at least 52% to demonstrate improved functional mobility.    Baseline:36 06/20/21 IN PROGRESS  3 Pt will be able to ambulate for greater than 20 minutes with LRAD to allow her to go shopping at a small store.    Baseline: 12 minutes (04/30/21) 06/20/21 INITIAL  4 Pt will increase BLE  strength to at least 4+/5 to allow her to negotiate steps without difficulty.  Baseline: 4/5 bil LE 06/20/21 INITIAL  5 Pt will have BERG of at least 48/56 to place her at a lower risk of falling.     Baseline:  06/20/21 INITIAL   PLAN: PT FREQUENCY: 2x/week  PT DURATION: 12 weeks  PLANNED INTERVENTIONS: Therapeutic exercises, Therapeutic activity, Neuro Muscular re-education, Balance training, Gait training, Patient/Family education, Joint mobilization, Stair training, Aquatic Therapy, Dry Needling, Electrical stimulation, Spinal mobilization, Cryotherapy, Moist heat, Taping, and Manual therapy  PLAN FOR NEXT SESSION: DN to gluteals and lumbar spine. Core strength, UE/LE strength.   Sigurd Sos, PT 05/14/21 1:17 PM

## 2021-05-16 ENCOUNTER — Other Ambulatory Visit: Payer: Self-pay

## 2021-05-16 ENCOUNTER — Encounter: Payer: Self-pay | Admitting: Rehabilitative and Restorative Service Providers"

## 2021-05-16 ENCOUNTER — Ambulatory Visit: Payer: Medicare PPO | Admitting: Rehabilitative and Restorative Service Providers"

## 2021-05-16 DIAGNOSIS — M6281 Muscle weakness (generalized): Secondary | ICD-10-CM | POA: Diagnosis not present

## 2021-05-16 DIAGNOSIS — M62830 Muscle spasm of back: Secondary | ICD-10-CM | POA: Diagnosis not present

## 2021-05-16 DIAGNOSIS — R262 Difficulty in walking, not elsewhere classified: Secondary | ICD-10-CM

## 2021-05-16 NOTE — Therapy (Signed)
OUTPATIENT PHYSICAL THERAPY TREATMENT    Patient Name: Miranda Romero MRN: [id number] DOB:1946-01-28, 76 y.o., female Today's Date: 05/16/2021  Progress Note Reporting Period 03/26/21 to 05/14/21  See note below for Objective Data and Assessment of Progress/Goals.     PT End of Session - 05/16/21 1058     Visit Number 11    Date for PT Re-Evaluation 06/20/21    Authorization Type [employer]    Authorization Time Period 12 visits approved 1/17-06/20/21    Authorization - Visit Number 11    Authorization - Number of Visits 12    Progress Note Due on Visit 20    PT Start Time 1100    PT Stop Time 1140    PT Time Calculation (min) 40 min    Activity Tolerance Patient tolerated treatment well    Behavior During Therapy WFL for tasks assessed/performed              Past Medical History:  Diagnosis Date   Arthritis    Blood transfusion    Compression fracture of L3 vertebra (HCC)    Fibromyalgia    Dr Estanislado Pandy   GERD (gastroesophageal reflux disease)    Hiatal hernia with gastroesophageal reflux 11/06/2015   History of hiatal hernia    Hyperlipidemia    Hypertension    Dr Osborne Casco LOV 4/13 on chart   Seasonal allergies    Sleep apnea    SEVERE  per sleep study 3/12 EPIC no CPAP    Transfusion history    3 yrs ago s/p surgery for knee   Umbilical hernia    Weight decrease    Wrist fracture    Left wrist   Past Surgical History:  Procedure Laterality Date   ANKLE SURGERY     APPENDECTOMY  2014   BALLOON DILATION N/A 06/05/2015   Procedure: BALLOON DILATION;  Surgeon: Arta Silence, MD;  Location: [facility];  Service: Endoscopy;  Laterality: N/A;   CATARACT EXTRACTION Bilateral 05/2013   CHOLECYSTECTOMY     DILATION AND CURETTAGE OF UTERUS     ESOPHAGOGASTRODUODENOSCOPY (EGD) WITH PROPOFOL N/A 06/05/2015   Procedure: ESOPHAGOGASTRODUODENOSCOPY (EGD) WITH PROPOFOL;  Surgeon: Arta Silence, MD;  Location: [facility];  Service: Endoscopy;  Laterality:  N/A;   HERNIA REPAIR  05/2013   due to appendectomy with mesh   HIATAL HERNIA REPAIR N/A 11/06/2015   Procedure: LAPAROSCOPIC REPAIR HIATAL HERNIA, Nissen FUNDOPLICATION;  Surgeon: Fanny Skates, MD;  Location: [facility];  Service: General;  Laterality: N/A;   JOINT REPLACEMENT     KNEE ARTHROPLASTY Bilateral    knee infection Right    knee hardware removed 11/12/   replaced 1/13   spine injection     [place] Left 11/09/2017   Procedure: LEFT TOTAL HIP ARTHROPLASTY;  Surgeon: Garald Balding, MD;  Location: [place];  Service: Orthopedics;  Laterality: Left;   VENTRAL HERNIA REPAIR  07/21/2011   Procedure: LAPAROSCOPIC VENTRAL HERNIA;  Surgeon: Adin Hector, MD;  Location: [facility];  Service: General;  Laterality: N/A;  Laparoscopic Repair Verntal Incisional Hernia and Mesh   Patient Active Problem List   Diagnosis Date Noted   Lumbar compression fracture ([place]) 06/19/2020   Colles' fracture of left radius, initial encounter for closed fracture 05/09/2020   Osteoarthritis of left hip 11/09/2017   S/P hip replacement, left 11/09/2017   Primary osteoarthritis of both hips 09/25/2016   Fibromyalgia 04/28/2016   Primary osteoarthritis of both knees 04/28/2016  History of total knee replacement, bilateral 04/28/2016   Primary osteoarthritis of both hands 04/28/2016   Primary insomnia 04/28/2016   History of sleep apnea 04/28/2016   History of restless legs syndrome 04/28/2016   Osteopenia of multiple sites 04/28/2016   Trochanteric bursitis of left hip 02/03/2016   Hiatal hernia with gastroesophageal reflux 11/06/2015   Hiatal hernia 03/25/2015   Gastroesophageal reflux disease with esophagitis 01/17/2015   Ligamentous laxity of knee 01/05/2011   Knee effusion, right 11/10/2010   Knee pain 11/10/2010   Incisional hernia 10/16/2010   HYPERTENSION 05/14/2010   OBSTRUCTIVE SLEEP APNEA 05/14/2010   Obesity 04/08/2009    PCP: Haywood Pao,  MD  REFERRING PROVIDER: Dr Bayard Males DIAG: M51.36 (ICD-10-CM) - DDD (degenerative disc disease), lumbar  Fall prevention   THERAPY DIAG:  Difficulty in walking, not elsewhere classified  Muscle weakness (generalized)  Muscle spasm of back  ONSET DATE: 04/2020  SUBJECTIVE:                                                                                                                                                                                           SUBJECTIVE STATEMENT: Pt reports feeling "not bad, it's been a week of doctors, but it's been good."  PERTINENT HISTORY:  Bil. TKA, Fibromyalgia, closed compression Fx of L2, osteopenia with DEXA 04/29/20  T-score of -1.2 of R hip. Fall Feb 2022 with compression Fx of L2  PAIN:  Are you having pain? Yes NPRS scale: 4/10 Pain location: back Pain orientation: Bilateral  PAIN TYPE: aching Pain description: intermittent and aching  Aggravating factors: standing, activity  Relieving factors: rest, stretching   PRECAUTIONS: Fall and Other: Osteoporosis  PATIENT GOALS To relieve the pain in some way and help get better mobility.    OBJECTIVE:   Initial evaluation 04/01/21: PATIENT SURVEYS:  FOTO 46 (goal is 52) POSTURE: Rounded Shoulders;Forward head  LE MMT: Bil LE strength of 4/5 grossly throughout   5x sit to stand: 18.5 sec with UE pushing up from thighs  Normal TUG (seconds) 13.1   with [facility]   05/14/21 TUG: 12.3 seconds 5x sit to stand: 12.81 seconds  FOTO: 36 (declined from eval-46 at evaluation)  TODAY'S TREATMENT   05/16/2021: Nustep L4 x6 min with PT present to discuss status Seated with 2#: LAQ, marching, hip abduction scissors. 2x10 bilat each Sit to/from 2x10 Standing heel raises and 3 way hip x10 bilat 6 min walk test:  830 ft with SPC Fwd step up on 4 inch step with bilat UE x10 bilat. Seated crunches holding 6# 2x10 Tandem at sink with UE support down  counter x4 laps  05/14/21:  Exercise:  *PT provided verbal and tactile cues for exercise.  Monitored for fatigue and pain NuStep: Level 3x 6 minutes-PT present to discuss progress LAQs: 2# 2x10 bil each Seated marching: 2# 2x10  Trigger Point Dry-Needling  Treatment instructions: Expect mild to moderate muscle soreness. S/S of pneumothorax if dry needled over a lung field, and to seek immediate medical attention should they occur. Patient verbalized understanding of these instructions and education.  Patient Consent Given: Yes Education handout provided: Previously provided Muscles treated: Bil gluteals, piriformis and lumbar multifidi Treatment response/outcome: Twitch response elicited and Palpable decrease in muscle tension  Manual: PT provided skilled palplation and monitoring during DN.   Elongation and release to bil gluteals and lumbar spine after needling. 05/07/21:  Exercise: *PT provided verbal and tactile cues for exercise.  Monitored for fatigue and pain NuStep: Level 3x 8 minutes-PT present to discuss progress LAQs: 2# 2x10 bil each Seated marching: 2# 2x10 Supine: horizontal abduction and ER with ab bracing: red band 2x10 Trigger Point Dry-Needling  Treatment instructions: Expect mild to moderate muscle soreness. S/S of pneumothorax if dry needled over a lung field, and to seek immediate medical attention should they occur. Patient verbalized understanding of these instructions and education.  Patient Consent Given: Yes Education handout provided: Previously provided Muscles treated: Bil gluteals, piriformis and lumbar multifidi Treatment response/outcome: Twitch response elicited and Palpable decrease in muscle tension  Manual: PT provided skilled palplation and monitoring during DN.   Elongation and release to bil gluteals and lumbar spine after needling.   HOME EXERCISE PROGRAM: PATIENT EDUCATION: Education details: Access Code: [email or web address] Person educated: Patient Education method: Holiday representative Education comprehension: verbalized understanding and returned demonstration  Access Code: [email or web address] URL: [email or web address] Date: 05/05/2021 Supine Shoulder Horizontal Abduction with Resistance - 2 x daily - 7 x weekly - 10 reps - 2 sets Supine Bilateral Shoulder External Rotation with Resistance - 2 x daily - 7 x weekly - 10 reps - 2 sets   ASSESSMENT:  CLINICAL IMPRESSION: Ms Coppola continues to progress towards goal related activities and reports that she was able to go to a quilting store and stand up for 25 minutes, though she did experience some pain.  She was able to tolerate a 6 minute walk test and completed 830 ft with SPC without taking a recovery period. With sit to/from stand, pt is able to complete from a regular height surface without use of UE. Initiated step up exercise, as pt is hoping to go to her beach house in a few weeks that has a staircase that she must negotiate with a railing on the left side going up. Pt has not yet reached her goals and continues to require skilled PT to progress towards increased safety and independence with functional tasks and community activities.   GOALS: Goals reviewed with patient? Yes  SHORT TERM GOALS:  STG Name Target Date Goal status  1 Pt will be independent with initial HEP Baseline:  05/02/21 MET        LONG TERM GOALS:   LTG Name Target Date Goal status  1 Pt will be independent with advanced HEP Baseline: independent and compliant in current HEP 06/20/21 IN PROGRESS  2 Pt will increase FOTO to at least 52% to demonstrate improved functional mobility.    Baseline:36 06/20/21 IN PROGRESS  3 Pt will be able to ambulate for greater than 20 minutes with LRAD to allow her to go shopping at a  small store.    Baseline: 12 minutes (04/30/21) 06/20/21 IN PROGRESS  4 Pt will increase BLE strength to at least 4+/5 to allow her to negotiate steps without difficulty.  Baseline: 4/5 bil LE 06/20/21 IN PROGRESS   5 Pt will have BERG of at least 48/56 to place her at a lower risk of falling.    Baseline:  06/20/21 IN PROGRESS   PLAN: PT FREQUENCY: 2x/week  PT DURATION: 12 weeks  PLANNED INTERVENTIONS: Therapeutic exercises, Therapeutic activity, Neuro Muscular re-education, Balance training, Gait training, Patient/Family education, Joint mobilization, Stair training, Aquatic Therapy, Dry Needling, Electrical stimulation, Spinal mobilization, Cryotherapy, Moist heat, Taping, and Manual therapy  PLAN FOR NEXT SESSION: Practice stairs with railing on left, DN to gluteals and lumbar spine. Core strength, UE/LE strength.   Oletha Tolson, PT 05/16/21 11:55 AM

## 2021-05-19 ENCOUNTER — Other Ambulatory Visit: Payer: Self-pay

## 2021-05-19 ENCOUNTER — Ambulatory Visit: Payer: Medicare PPO

## 2021-05-19 DIAGNOSIS — M6281 Muscle weakness (generalized): Secondary | ICD-10-CM | POA: Diagnosis not present

## 2021-05-19 DIAGNOSIS — R262 Difficulty in walking, not elsewhere classified: Secondary | ICD-10-CM

## 2021-05-19 DIAGNOSIS — M62830 Muscle spasm of back: Secondary | ICD-10-CM

## 2021-05-19 NOTE — Therapy (Addendum)
OUTPATIENT PHYSICAL THERAPY TREATMENT    Patient Name: Miranda Romero MRN: [id number] DOB:1945-10-10, 76 y.o., female Today's Date: 05/19/2021     PT End of Session - 05/19/21 1307     Visit Number 12    Date for PT Re-Evaluation 06/20/21    Authorization Type [employer]    Authorization Time Period 12 visits approved 1/17-06/20/21.  Requested 4 more sessions    Authorization - Visit Number 12    Authorization - Number of Visits 12    PT Start Time 1232    PT Stop Time 1314    PT Time Calculation (min) 42 min    Activity Tolerance Patient tolerated treatment well    Behavior During Therapy WFL for tasks assessed/performed               Past Medical History:  Diagnosis Date   Arthritis    Blood transfusion    Compression fracture of L3 vertebra (HCC)    Fibromyalgia    Dr Estanislado Pandy   GERD (gastroesophageal reflux disease)    Hiatal hernia with gastroesophageal reflux 11/06/2015   History of hiatal hernia    Hyperlipidemia    Hypertension    Dr Osborne Casco LOV 4/13 on chart   Seasonal allergies    Sleep apnea    SEVERE  per sleep study 3/12 EPIC no CPAP    Transfusion history    3 yrs ago s/p surgery for knee   Umbilical hernia    Weight decrease    Wrist fracture    Left wrist   Past Surgical History:  Procedure Laterality Date   ANKLE SURGERY     APPENDECTOMY  2014   BALLOON DILATION N/A 06/05/2015   Procedure: BALLOON DILATION;  Surgeon: Arta Silence, MD;  [facility];  Service: Endoscopy;  Laterality: N/A;   CATARACT EXTRACTION Bilateral 05/2013   CHOLECYSTECTOMY     DILATION AND CURETTAGE OF UTERUS     ESOPHAGOGASTRODUODENOSCOPY (EGD) WITH PROPOFOL N/A 06/05/2015   Procedure: ESOPHAGOGASTRODUODENOSCOPY (EGD) WITH PROPOFOL;  Surgeon: Arta Silence, MD;  [facility];  Service: Endoscopy;  Laterality: N/A;   HERNIA REPAIR  05/2013   due to appendectomy with mesh   HIATAL HERNIA REPAIR N/A 11/06/2015   Procedure:  LAPAROSCOPIC REPAIR HIATAL HERNIA, Nissen FUNDOPLICATION;  Surgeon: Fanny Skates, MD;  Location: [facility];  Service: General;  Laterality: N/A;   JOINT REPLACEMENT     KNEE ARTHROPLASTY Bilateral    knee infection Right    knee hardware removed 11/12/   replaced 1/13   spine injection     [place] Left 11/09/2017   Procedure: LEFT TOTAL HIP ARTHROPLASTY;  Surgeon: Garald Balding, MD;  Location: [place];  Service: Orthopedics;  Laterality: Left;   VENTRAL HERNIA REPAIR  07/21/2011   Procedure: LAPAROSCOPIC VENTRAL HERNIA;  Surgeon: Adin Hector, MD;  Location: [facility];  Service: General;  Laterality: N/A;  Laparoscopic Repair Verntal Incisional Hernia and Mesh   Patient Active Problem List   Diagnosis Date Noted   Lumbar compression fracture ([place]) 06/19/2020   Colles' fracture of left radius, initial encounter for closed fracture 05/09/2020   Osteoarthritis of left hip 11/09/2017   S/P hip replacement, left 11/09/2017   Primary osteoarthritis of both hips 09/25/2016   Fibromyalgia 04/28/2016   Primary osteoarthritis of both knees 04/28/2016   History of total knee replacement, bilateral 04/28/2016   Primary osteoarthritis of both hands 04/28/2016   Primary insomnia 04/28/2016  History of sleep apnea 04/28/2016   History of restless legs syndrome 04/28/2016   Osteopenia of multiple sites 04/28/2016   Trochanteric bursitis of left hip 02/03/2016   Hiatal hernia with gastroesophageal reflux 11/06/2015   Hiatal hernia 03/25/2015   Gastroesophageal reflux disease with esophagitis 01/17/2015   Ligamentous laxity of knee 01/05/2011   Knee effusion, right 11/10/2010   Knee pain 11/10/2010   Incisional hernia 10/16/2010   HYPERTENSION 05/14/2010   OBSTRUCTIVE SLEEP APNEA 05/14/2010   Obesity 04/08/2009    PCP: Haywood Pao, MD  REFERRING PROVIDER: Dr Bayard Males DIAG: M51.36 (ICD-10-CM) - DDD (degenerative disc disease), lumbar   Fall prevention   THERAPY DIAG:  Difficulty in walking, not elsewhere classified  Muscle weakness (generalized)  Muscle spasm of back  ONSET DATE: 04/2020  SUBJECTIVE:                                                                                                                                                                                           SUBJECTIVE STATEMENT: I rode my bike on Saturday.  I plan to do it regularly and add time as I'm able.    PERTINENT HISTORY:  Bil. TKA, Fibromyalgia, closed compression Fx of L2, osteopenia with DEXA 04/29/20  T-score of -1.2 of R hip. Fall Feb 2022 with compression Fx of L2  PAIN:  Are you having pain? Yes NPRS scale: 4/10 Pain location: back Pain orientation: Bilateral  PAIN TYPE: aching Pain description: intermittent and aching  Aggravating factors: standing, activity  Relieving factors: rest, stretching   PRECAUTIONS: Fall and Other: Osteoporosis  PATIENT GOALS To relieve the pain in some way and help get better mobility.    OBJECTIVE:   Initial evaluation 04/01/21: PATIENT SURVEYS:  FOTO 46 (goal is 52) POSTURE: Rounded Shoulders;Forward head  LE MMT: Bil LE strength of 4/5 grossly throughout   5x sit to stand: 18.5 sec with UE pushing up from thighs  Normal TUG (seconds) 13.1   with [facility]   05/14/21 TUG: 12.3 seconds 5x sit to stand: 12.81 seconds  FOTO: 36 (declined from eval-46 at evaluation)  TODAY'S TREATMENT  05/19/2021: Nustep L4 x6 min with PT present to discuss status Seated with 2#: LAQ, marching 2x10 bilat each Sit to/from chair without pad x10 Standing heel raises and 3 way hip x10 bilat Steps: up/down 6" steps x 4 steps with rail on Lt and cane on Rt.  Step-to leading with Rt with ascending and leading with Lt descending Seated hip abduction with red band: Lt only 2x10-added to HEP Seated crunches holding 6# 2x10 Tandem at sink with UE support down  counter x4 laps-Lt hip instability with  this 05/16/2021: Nustep L4 x6 min with PT present to discuss status Seated with 2#: LAQ, marching, hip abduction scissors. 2x10 bilat each Sit to/from chair without pad x10 Standing heel raises and 3 way hip x10 bilat Steps: up/down 6" steps x 4 steps with rail on Lt and cane on Rt.  Step-to leading with Rt with ascending and leading with Lt descending Seated crunches holding 6# 2x10 Tandem at sink with UE support down counter x4 laps-  05/14/21:  Exercise: *PT provided verbal and tactile cues for exercise.  Monitored for fatigue and pain NuStep: Level 3x 6 minutes-PT present to discuss progress LAQs: 2# 2x10 bil each Seated marching: 2# 2x10  Trigger Point Dry-Needling  Treatment instructions: Expect mild to moderate muscle soreness. S/S of pneumothorax if dry needled over a lung field, and to seek immediate medical attention should they occur. Patient verbalized understanding of these instructions and education.  Patient Consent Given: Yes Education handout provided: Previously provided Muscles treated: Bil gluteals, piriformis and lumbar multifidi Treatment response/outcome: Twitch response elicited and Palpable decrease in muscle tension  Manual: PT provided skilled palplation and monitoring during DN.   Elongation and release to bil gluteals and lumbar spine after needling.   HOME EXERCISE PROGRAM: PATIENT EDUCATION: EAccess Code: [email or web address] URL: [email or web address] Date: 05/19/2021 Prepared by: Claiborne Billings  Exercises 05/19/21 Seated Single Leg Hip Abduction with Resistance - 2 x daily - 7 x weekly - 2 sets - 10 reps  Clinical Impression:  Pt continues to progress towards goal related activities and reports that she is able to do more at home and in the community.  She did 6 minute walk test last session and completed 830 ft with use of her cane.  Pt worked on walking up/down the steps with use of rail on Lt to simulate steps at her beach house.  Pt with Rt glute med  weakness and was challenged with lifting the Rt and stabilize on the Rt.  PT added seated hip abduction with resistance to work on [employer].  Pt has not yet reached her goals and continues to require skilled PT to progress towards increased safety and independence with functional tasks and community activities.   GOALS: Goals reviewed with patient? Yes  SHORT TERM GOALS:  STG Name Target Date Goal status  1 Pt will be independent with initial HEP Baseline:  05/02/21 MET        LONG TERM GOALS:   LTG Name Target Date Goal status  1 Pt will be independent with advanced HEP Baseline: independent and compliant in current HEP 06/20/21 IN PROGRESS  2 Pt will increase FOTO to at least 52% to demonstrate improved functional mobility.    Baseline:36 06/20/21 IN PROGRESS  3 Pt will be able to ambulate for greater than 20 minutes with LRAD to allow her to go shopping at a small store.    Baseline: 12 minutes (04/30/21) 06/20/21 IN PROGRESS  4 Pt will increase BLE strength to at least 4+/5 to allow her to negotiate steps without difficulty.  Baseline: 4/5 bil LE 06/20/21 IN PROGRESS  5 Pt will have BERG of at least 48/56 to place her at a lower risk of falling.    Baseline:  06/20/21 IN PROGRESS   PLAN: PT FREQUENCY: 2x/week  PT DURATION: 12 weeks  PLANNED INTERVENTIONS: Therapeutic exercises, Therapeutic activity, Neuro Muscular re-education, Balance training, Gait training, Patient/Family education, Joint mobilization, Stair training, Aquatic Therapy, Dry Needling, Electrical stimulation,  Spinal mobilization, Cryotherapy, Moist heat, Taping, and Manual therapy  PLAN FOR NEXT SESSION: Continue to practice stairs with railing on left, DN to gluteals and lumbar spine. Core strength, UE/LE strength.   Sigurd Sos, PT 05/19/21 1:20 PM

## 2021-05-21 ENCOUNTER — Ambulatory Visit: Payer: Medicare PPO

## 2021-05-21 ENCOUNTER — Other Ambulatory Visit: Payer: Self-pay

## 2021-05-21 DIAGNOSIS — M62830 Muscle spasm of back: Secondary | ICD-10-CM | POA: Diagnosis not present

## 2021-05-21 DIAGNOSIS — R262 Difficulty in walking, not elsewhere classified: Secondary | ICD-10-CM

## 2021-05-21 DIAGNOSIS — M6281 Muscle weakness (generalized): Secondary | ICD-10-CM | POA: Diagnosis not present

## 2021-05-21 NOTE — Therapy (Signed)
OUTPATIENT PHYSICAL THERAPY TREATMENT    Patient Name: Miranda Romero MRN: [id number] DOB:May 08, 1945, 76 y.o., female Today's Date: 05/21/2021     PT End of Session - 05/21/21 1309     Visit Number 13    Date for PT Re-Evaluation 06/20/21    Authorization Type [employer]    Authorization Time Period 4 additional 3/8-4/7    Authorization - Visit Number 1    Authorization - Number of Visits 4    Progress Note Due on Visit 27    PT Start Time 1232    PT Stop Time 1311    PT Time Calculation (min) 39 min    Activity Tolerance Patient tolerated treatment well    Behavior During Therapy [facility] for tasks assessed/performed                Past Medical History:  Diagnosis Date   Arthritis    Blood transfusion    Compression fracture of L3 vertebra (HCC)    Fibromyalgia    Dr Estanislado Pandy   GERD (gastroesophageal reflux disease)    Hiatal hernia with gastroesophageal reflux 11/06/2015   History of hiatal hernia    Hyperlipidemia    Hypertension    Dr Osborne Casco LOV 4/13 on chart   Seasonal allergies    Sleep apnea    SEVERE  per sleep study 3/12 EPIC no CPAP    Transfusion history    3 yrs ago s/p surgery for knee   Umbilical hernia    Weight decrease    Wrist fracture    Left wrist   Past Surgical History:  Procedure Laterality Date   ANKLE SURGERY     APPENDECTOMY  2014   BALLOON DILATION N/A 06/05/2015   Procedure: BALLOON DILATION;  Surgeon: Arta Silence, MD;  Location: [facility];  Service: Endoscopy;  Laterality: N/A;   CATARACT EXTRACTION Bilateral 05/2013   CHOLECYSTECTOMY     DILATION AND CURETTAGE OF UTERUS     ESOPHAGOGASTRODUODENOSCOPY (EGD) WITH PROPOFOL N/A 06/05/2015   Procedure: ESOPHAGOGASTRODUODENOSCOPY (EGD) WITH PROPOFOL;  Surgeon: Arta Silence, MD;  [facility];  Service: Endoscopy;  Laterality: N/A;   HERNIA REPAIR  05/2013   due to appendectomy with mesh   HIATAL HERNIA REPAIR N/A 11/06/2015   Procedure: LAPAROSCOPIC  REPAIR HIATAL HERNIA, Nissen FUNDOPLICATION;  Surgeon: Fanny Skates, MD;  Location: [facility];  Service: General;  Laterality: N/A;   JOINT REPLACEMENT     KNEE ARTHROPLASTY Bilateral    knee infection Right    knee hardware removed 11/12/   replaced 1/13   spine injection     [place] Left 11/09/2017   Procedure: LEFT TOTAL HIP ARTHROPLASTY;  Surgeon: Garald Balding, MD;  Location: [place];  Service: Orthopedics;  Laterality: Left;   VENTRAL HERNIA REPAIR  07/21/2011   Procedure: LAPAROSCOPIC VENTRAL HERNIA;  Surgeon: Adin Hector, MD;  Location: [facility];  Service: General;  Laterality: N/A;  Laparoscopic Repair Verntal Incisional Hernia and Mesh   Patient Active Problem List   Diagnosis Date Noted   Lumbar compression fracture ([place]) 06/19/2020   Colles' fracture of left radius, initial encounter for closed fracture 05/09/2020   Osteoarthritis of left hip 11/09/2017   S/P hip replacement, left 11/09/2017   Primary osteoarthritis of both hips 09/25/2016   Fibromyalgia 04/28/2016   Primary osteoarthritis of both knees 04/28/2016   History of total knee replacement, bilateral 04/28/2016   Primary osteoarthritis of both hands 04/28/2016  Primary insomnia 04/28/2016   History of sleep apnea 04/28/2016   History of restless legs syndrome 04/28/2016   Osteopenia of multiple sites 04/28/2016   Trochanteric bursitis of left hip 02/03/2016   Hiatal hernia with gastroesophageal reflux 11/06/2015   Hiatal hernia 03/25/2015   Gastroesophageal reflux disease with esophagitis 01/17/2015   Ligamentous laxity of knee 01/05/2011   Knee effusion, right 11/10/2010   Knee pain 11/10/2010   Incisional hernia 10/16/2010   HYPERTENSION 05/14/2010   OBSTRUCTIVE SLEEP APNEA 05/14/2010   Obesity 04/08/2009    PCP: Haywood Pao, MD  REFERRING PROVIDER: Dr Bayard Males DIAG: M51.36 (ICD-10-CM) - DDD (degenerative disc disease), lumbar  Fall  prevention   THERAPY DIAG:  No diagnosis found.  ONSET DATE: 04/2020  SUBJECTIVE:                                                                                                                                                                                           SUBJECTIVE STATEMENT: I rode my bike for 2 miles yesterday.    PERTINENT HISTORY:  Bil. TKA, Fibromyalgia, closed compression Fx of L2, osteopenia with DEXA 04/29/20  T-score of -1.2 of R hip. Fall Feb 2022 with compression Fx of L2  PAIN:  Are you having pain? Yes NPRS scale: 3-4/10 Pain location: back Pain orientation: Bilateral  PAIN TYPE: aching Pain description: intermittent and aching  Aggravating factors: standing, activity  Relieving factors: rest, stretching   PRECAUTIONS: Fall and Other: Osteoporosis  PATIENT GOALS To relieve the pain in some way and help get better mobility.    OBJECTIVE:   Initial evaluation 04/01/21: PATIENT SURVEYS:  FOTO 46 (goal is 52) POSTURE: Rounded Shoulders;Forward head  LE MMT: Bil LE strength of 4/5 grossly throughout   5x sit to stand: 18.5 sec with UE pushing up from thighs  Normal TUG (seconds) 13.1   with [facility]   05/14/21 TUG: 12.3 seconds 5x sit to stand: 12.81 seconds  FOTO: 36 (declined from eval-46 at evaluation)  TODAY'S TREATMENT  05/21/2021: Nustep L4 x6 min with PT present to discuss status Seated with 2#: LAQ, marching, hip abduction scissors. 2x10 bilat each Sit to/from chair without pad x10 Standing heel raises and 3 way hip x10 bilat Steps: up/down 6" steps x 4 steps with rail on Lt.   Step-to leading with Lt  with ascending and leading with Rt descending- PT advised that she practice Lt step up on single step at home to get ready for beach trip.  Seated hip abduction with red band: Lt only 2x10 Seated crunches holding 6# 2x10 Tandem stance with mod UE support  05/19/2021: Nustep L4 x6 min with PT present to discuss status Seated with 2#: LAQ,  marching, hip abduction. 2x10 bilat each Sit to/from chair without pad x10 Standing heel raises and 3 way hip x10 bilat Steps: up/down 6" steps x 4 steps with rail on Lt and cane on Rt.  Step-to leading with Rt with ascending and leading with Lt descending Seated hip abduction with red band: Lt only 2x10 Seated crunches holding 6# 2x10 Tandem at sink with UE support down counter x4 laps-glute med instability with this   05/16/2021: Nustep L4 x6 min with PT present to discuss status Seated with 2#: LAQ, marching, hip abduction scissors. 2x10 bilat each Sit to/from chair without pad x10 Standing heel raises and 3 way hip x10 bilat Steps: up/down 6" steps x 4 steps with rail on Lt and cane on Rt.  Step-to leading with Rt with ascending and leading with Lt descending Seated hip abduction with red band: Lt only 2x10 Seated crunches holding 6# 2x10 Tandem at sink with UE support down counter x4 laps-  HOME EXERCISE PROGRAM: PATIENT EDUCATION: EAccess Code: [email or web address] URL: [email or web address] Date: 05/19/2021 Prepared by: Claiborne Billings  Exercises 05/19/21 Seated Single Leg Hip Abduction with Resistance - 2 x daily - 7 x weekly - 2 sets - 10 reps  Clinical Impression:  Pt continues to demonstrate Lt gluteal weakness and requires max UE support with single limb activity on the Lt.  Steps were improved today and didn't require use of cane and was able to ascend and descend with use of rail on the Lt. PT added seated hip abduction with resistance to work on [employer] med last session and pt has been working on this at home.  Pt is significantly challenged by current level of exercise. Pt has not yet reached her goals and continues to require skilled PT to progress towards increased safety and independence with functional tasks and community activities.   GOALS: Goals reviewed with patient? Yes  SHORT TERM GOALS:  STG Name Target Date Goal status  1 Pt will be independent with initial  HEP Baseline:  05/02/21 MET        LONG TERM GOALS:   LTG Name Target Date Goal status  1 Pt will be independent with advanced HEP Baseline: independent and compliant in current HEP 06/20/21 IN PROGRESS  2 Pt will increase FOTO to at least 52% to demonstrate improved functional mobility.    Baseline:36 06/20/21 IN PROGRESS  3 Pt will be able to ambulate for greater than 20 minutes with LRAD to allow her to go shopping at a small store.    Baseline: 12 minutes (04/30/21) 06/20/21 IN PROGRESS  4 Pt will increase BLE strength to at least 4+/5 to allow her to negotiate steps without difficulty.  Baseline: 4/5 bil LE 06/20/21 IN PROGRESS  5 Pt will have BERG of at least 48/56 to place her at a lower risk of falling.    Baseline:  06/20/21 IN PROGRESS   PLAN: PT FREQUENCY: 2x/week  PT DURATION: 12 weeks  PLANNED INTERVENTIONS: Therapeutic exercises, Therapeutic activity, Neuro Muscular re-education, Balance training, Gait training, Patient/Family education, Joint mobilization, Stair training, Aquatic Therapy, Dry Needling, Electrical stimulation, Spinal mobilization, Cryotherapy, Moist heat, Taping, and Manual therapy  PLAN FOR NEXT SESSION: Continue to practice stairs with railing on left, DN to gluteals and lumbar spine. Core strength, UE/LE strength. D/C end of next week due to pt going to the beach for 4 weeks   Sigurd Sos, PT  05/21/21 1:10 PM

## 2021-05-26 ENCOUNTER — Ambulatory Visit: Payer: Medicare PPO

## 2021-05-26 ENCOUNTER — Other Ambulatory Visit: Payer: Self-pay

## 2021-05-26 DIAGNOSIS — R262 Difficulty in walking, not elsewhere classified: Secondary | ICD-10-CM

## 2021-05-26 DIAGNOSIS — M6281 Muscle weakness (generalized): Secondary | ICD-10-CM | POA: Diagnosis not present

## 2021-05-26 DIAGNOSIS — M62830 Muscle spasm of back: Secondary | ICD-10-CM | POA: Diagnosis not present

## 2021-05-26 NOTE — Therapy (Signed)
OUTPATIENT PHYSICAL THERAPY TREATMENT    Patient Name: Miranda Romero MRN: [id number] DOB:05-Oct-1945, 76 y.o., female Today's Date: 05/26/2021     PT End of Session - 05/26/21 1306     Visit Number 14    Date for PT Re-Evaluation 06/20/21    Authorization Type [employer]    Authorization Time Period 4 additional 3/8-4/7    Authorization - Visit Number 2    Authorization - Number of Visits 4    Progress Note Due on Visit 2    PT Start Time 1232    PT Stop Time 1306    PT Time Calculation (min) 34 min    Activity Tolerance Patient tolerated treatment well    Behavior During Therapy [facility] for tasks assessed/performed                 Past Medical History:  Diagnosis Date   Arthritis    Blood transfusion    Compression fracture of L3 vertebra (HCC)    Fibromyalgia    Dr Estanislado Pandy   GERD (gastroesophageal reflux disease)    Hiatal hernia with gastroesophageal reflux 11/06/2015   History of hiatal hernia    Hyperlipidemia    Hypertension    Dr Osborne Casco LOV 4/13 on chart   Seasonal allergies    Sleep apnea    SEVERE  per sleep study 3/12 EPIC no CPAP    Transfusion history    3 yrs ago s/p surgery for knee   Umbilical hernia    Weight decrease    Wrist fracture    Left wrist   Past Surgical History:  Procedure Laterality Date   ANKLE SURGERY     APPENDECTOMY  2014   BALLOON DILATION N/A 06/05/2015   Procedure: BALLOON DILATION;  Surgeon: Arta Silence, MD;  [facility];  Service: Endoscopy;  Laterality: N/A;   CATARACT EXTRACTION Bilateral 05/2013   CHOLECYSTECTOMY     DILATION AND CURETTAGE OF UTERUS     ESOPHAGOGASTRODUODENOSCOPY (EGD) WITH PROPOFOL N/A 06/05/2015   Procedure: ESOPHAGOGASTRODUODENOSCOPY (EGD) WITH PROPOFOL;  Surgeon: Arta Silence, MD;  [facility];  Service: Endoscopy;  Laterality: N/A;   HERNIA REPAIR  05/2013   due to appendectomy with mesh   HIATAL HERNIA REPAIR N/A 11/06/2015   Procedure: LAPAROSCOPIC  REPAIR HIATAL HERNIA, Nissen FUNDOPLICATION;  Surgeon: Fanny Skates, MD;  [facility];  Service: General;  Laterality: N/A;   JOINT REPLACEMENT     KNEE ARTHROPLASTY Bilateral    knee infection Right    knee hardware removed 11/12/   replaced 1/13   spine injection     [place] Left 11/09/2017   Procedure: LEFT TOTAL HIP ARTHROPLASTY;  Surgeon: Garald Balding, MD;  Location: [place];  Service: Orthopedics;  Laterality: Left;   VENTRAL HERNIA REPAIR  07/21/2011   Procedure: LAPAROSCOPIC VENTRAL HERNIA;  Surgeon: Adin Hector, MD;  [facility];  Service: General;  Laterality: N/A;  Laparoscopic Repair Verntal Incisional Hernia and Mesh   Patient Active Problem List   Diagnosis Date Noted   Lumbar compression fracture ([place]) 06/19/2020   Colles' fracture of left radius, initial encounter for closed fracture 05/09/2020   Osteoarthritis of left hip 11/09/2017   S/P hip replacement, left 11/09/2017   Primary osteoarthritis of both hips 09/25/2016   Fibromyalgia 04/28/2016   Primary osteoarthritis of both knees 04/28/2016   History of total knee replacement, bilateral 04/28/2016   Primary osteoarthritis of both hands  Primary insomnia 04/28/2016  ° History of sleep apnea 04/28/2016  ° History of restless legs syndrome 04/28/2016  ° Osteopenia of multiple sites 04/28/2016  ° Trochanteric bursitis of left hip 02/03/2016  ° Hiatal hernia with gastroesophageal reflux 11/06/2015  ° Hiatal hernia 03/25/2015  ° Gastroesophageal reflux disease with esophagitis 01/17/2015  ° Ligamentous laxity of knee 01/05/2011  ° Knee effusion, right 11/10/2010  ° Knee pain 11/10/2010  ° Incisional hernia 10/16/2010  ° HYPERTENSION 05/14/2010  ° OBSTRUCTIVE SLEEP APNEA 05/14/2010  ° Obesity 04/08/2009  ° ° °PCP: Tisovec, Richard W, MD ° °REFERRING PROVIDER: Dr Deveshwar ° °REFERRING DIAG: M51.36 (ICD-10-CM) - DDD (degenerative disc disease), lumbar  Fall  prevention  ° °THERAPY DIAG:  °Difficulty in walking, not elsewhere classified ° °Muscle spasm of back ° °Muscle weakness (generalized) ° °ONSET DATE: 04/2020 ° °SUBJECTIVE:                                                                                                                                                                                          ° °SUBJECTIVE STATEMENT: °I rode my bike for 2 miles yesterday.   ° °PERTINENT HISTORY:  °Bil. TKA, Fibromyalgia, closed compression Fx of L2, osteopenia with DEXA 04/29/20  T-score of -1.2 of R hip. Fall Feb 2022 with compression Fx of L2 ° °PAIN:  °Are you having pain? Yes °NPRS scale: 3-4/10 °Pain location: back °Pain orientation: Bilateral  °PAIN TYPE: aching °Pain description: intermittent and aching  °Aggravating factors: standing, activity  °Relieving factors: rest, stretching  ° °PRECAUTIONS: Fall and Other: Osteoporosis ° °PATIENT GOALS To relieve the pain in some way and help get better mobility.  ° ° °OBJECTIVE:  ° °Initial evaluation 04/01/21: °PATIENT SURVEYS:  °FOTO 46 (goal is 52) °POSTURE: Rounded Shoulders;Forward head  °LE MMT: °Bil LE strength of 4/5 grossly throughout  ° °5x sit to stand: 18.5 sec with UE pushing up from thighs  °Normal TUG (seconds) 13.1   with SPC  ° °05/14/21 °TUG: 12.3 seconds °5x sit to stand: 12.81 seconds  °FOTO: 36 (declined from eval-46 at evaluation) ° °TODAY'S TREATMENT  °05/26/2021: °Nustep L4 x6 min with PT present to discuss status °Seated with 2#: LAQ, marching, hip abduction scissors. 2x10 bilat each °Sit to/from chair without pad x10 °Seated hip abduction with red band: Lt only 2x10 °Seated crunches holding 6# 2x10 °DRY NEEDLING: °Dry needling consent given? yes °Educational handouts provided? yes °Muscles treated: Lumbar multifidi °Response from dry needling: twitch response, improved muscle length °Manual: skilled palpation and monitoring during DN.  Elongation and tissue mobility s/p needling.     °05/21/2021: °Nustep L4 x6 min with   PT present to discuss status °Seated with 2#: LAQ, marching, hip abduction scissors. 2x10 bilat each °Sit to/from chair without pad x10 °Standing heel raises and 3 way hip x10 bilat °Steps: up/down 6" steps x 4 steps with rail on Lt.   Step-to leading with Lt  with ascending and leading with Rt descending- PT advised that she practice Lt step up on single step at home to get ready for beach trip.  °Seated hip abduction with red band: Lt only 2x10 °Seated crunches holding 6# 2x10 °Tandem stance with mod UE support  °05/19/2021: °Nustep L4 x6 min with PT present to discuss status °Seated with 2#: LAQ, marching, hip abduction. 2x10 bilat each °Sit to/from chair without pad x10 °Standing heel raises and 3 way hip x10 bilat °Steps: up/down 6" steps x 4 steps with rail on Lt and cane on Rt.  Step-to leading with Rt with ascending and leading with Lt descending °Seated hip abduction with red band: Lt only 2x10 °Seated crunches holding 6# 2x10 °Tandem at sink with UE support down counter x4 laps-glute med instability with this  ° °HOME EXERCISE PROGRAM: °PATIENT EDUCATION: °EAccess Code: [email or web address] °URL: [email or web address] °Date: 05/19/2021 °Prepared by: Kelly ° °Exercises °05/19/21 °Seated Single Leg Hip Abduction with Resistance - 2 x daily - 7 x weekly - 2 sets - 10 reps ° °Clinical Impression:  °Pt continues to work on strength to improve her ability to negotiate steps. Pt is now riding her bike x 4 miles at home.  PT is now able to walk for 20 minutes without rest.  Pt with tension and trigger points in the lumbar spine and demonstrates improved tissue mobility after manual therapy today. Pt is going to D/C next visit.  She will be going to the beach x 1 month and then will go back to her [place] house.   ° ° °GOALS: °Goals reviewed with patient? Yes ° °SHORT TERM GOALS: ° °STG Name Target Date Goal status  °1 Pt will be independent with initial HEP °Baseline:  05/02/21 MET  °      ° °LONG TERM GOALS:  ° °LTG Name Target Date Goal status  °1 Pt will be independent with advanced HEP °Baseline: independent and compliant in current HEP 06/20/21 IN PROGRESS  °2 Pt will increase FOTO to at least 52% to demonstrate improved functional mobility.   ° °Baseline:36 06/20/21 IN PROGRESS  °3 Pt will be able to ambulate for greater than 20 minutes with LRAD to allow her to go shopping at a small store.   ° °Baseline: 20 minutes-05/26/21 06/20/21 MET  °4 Pt will increase BLE strength to at least 4+/5 to allow her to negotiate steps without difficulty.  °Baseline: 4/5 bil LE 06/20/21 IN PROGRESS  °5 Pt will have BERG of at least 48/56 to place her at a lower risk of falling.   ° °Baseline:  06/20/21 IN PROGRESS  ° °PLAN: °PT FREQUENCY: 2x/week ° °PT DURATION: 12 weeks ° °PLANNED INTERVENTIONS: Therapeutic exercises, Therapeutic activity, Neuro Muscular re-education, Balance training, Gait training, Patient/Family education, Joint mobilization, Stair training, Aquatic Therapy, Dry Needling, Electrical stimulation, Spinal mobilization, Cryotherapy, Moist heat, Taping, and Manual therapy ° °PLAN FOR NEXT SESSION: 1 more session,  DN to gluteals next session. Core strength, UE/LE strength. D/C end of next week due to pt going to the beach for 4 weeks and then back to her house in [place].  Give FOTO.  ° °Kelly Takacs, PT °05/26/21 1:08 PM  °

## 2021-05-28 ENCOUNTER — Ambulatory Visit: Payer: Medicare PPO

## 2021-05-28 ENCOUNTER — Other Ambulatory Visit: Payer: Self-pay

## 2021-05-28 DIAGNOSIS — R262 Difficulty in walking, not elsewhere classified: Secondary | ICD-10-CM | POA: Diagnosis not present

## 2021-05-28 DIAGNOSIS — M6281 Muscle weakness (generalized): Secondary | ICD-10-CM | POA: Diagnosis not present

## 2021-05-28 DIAGNOSIS — M62830 Muscle spasm of back: Secondary | ICD-10-CM

## 2021-05-28 NOTE — Therapy (Signed)
?OUTPATIENT PHYSICAL THERAPY TREATMENT  ? ? ?Patient Name: Miranda Romero ?MRN: [id number] ?DOB:04-14-1945, 76 y.o., female ?Today's Date: 05/28/2021 ? ? ? ? PT End of Session - 05/28/21 1238   ? ? Visit Number 15   ? Authorization - Visit Number 4   ? Authorization - Number of Visits 4   ? PT Start Time 1232   ? PT Stop Time 1309   ? PT Time Calculation (min) 37 min   ? Activity Tolerance Patient tolerated treatment well   ? Behavior During Therapy [facility] for tasks assessed/performed   ? ?  ?  ? ?  ? ? ? ? ? ? ? ?Past Medical History:  ?Diagnosis Date  ? Arthritis   ? Blood transfusion   ? Compression fracture of L3 vertebra (HCC)   ? Fibromyalgia   ? Dr Estanislado Pandy  ? GERD (gastroesophageal reflux disease)   ? Hiatal hernia with gastroesophageal reflux 11/06/2015  ? History of hiatal hernia   ? Hyperlipidemia   ? Hypertension   ? Dr Osborne Casco LOV 4/13 on chart  ? Seasonal allergies   ? Sleep apnea   ? SEVERE  per sleep study 3/12 EPIC no CPAP   ? Transfusion history   ? 3 yrs ago s/p surgery for knee  ? Umbilical hernia   ? Weight decrease   ? Wrist fracture   ? Left wrist  ? ?Past Surgical History:  ?Procedure Laterality Date  ? ANKLE SURGERY    ? APPENDECTOMY  2014  ? BALLOON DILATION N/A 06/05/2015  ? Procedure: BALLOON DILATION;  Surgeon: Arta Silence, MD;  Location: [facility];  Service: Endoscopy;  Laterality: N/A;  ? CATARACT EXTRACTION Bilateral 05/2013  ? CHOLECYSTECTOMY    ? DILATION AND CURETTAGE OF UTERUS    ? ESOPHAGOGASTRODUODENOSCOPY (EGD) WITH PROPOFOL N/A 06/05/2015  ? Procedure: ESOPHAGOGASTRODUODENOSCOPY (EGD) WITH PROPOFOL;  Surgeon: Arta Silence, MD;  [facility]: [facility];  Service: Endoscopy;  Laterality: N/A;  ? HERNIA REPAIR  05/2013  ? due to appendectomy with mesh  ? HIATAL HERNIA REPAIR N/A 11/06/2015  ? Procedure: LAPAROSCOPIC REPAIR HIATAL HERNIA, Nissen FUNDOPLICATION;  Surgeon: Fanny Skates, MD;  Location: [facility];  Service: General;  Laterality: N/A;  ? JOINT REPLACEMENT    ?  KNEE ARTHROPLASTY Bilateral   ? knee infection Right   ? knee hardware removed 11/12/   replaced 1/13  ? spine injection    ? [facility]  ? TOTAL HIP ARTHROPLASTY Left 11/09/2017  ? Procedure: LEFT TOTAL HIP ARTHROPLASTY;  Surgeon: Garald Balding, MD;  Location: [facility];  Service: Orthopedics;  Laterality: Left;  ? VENTRAL HERNIA REPAIR  07/21/2011  ? Procedure: LAPAROSCOPIC VENTRAL HERNIA;  Surgeon: Adin Hector, MD;  Location: [facility];  Service: General;  Laterality: N/A;  Laparoscopic Repair Verntal Incisional Hernia and Mesh  ? ?Patient Active Problem List  ? Diagnosis Date Noted  ? Lumbar compression fracture ([place]) 06/19/2020  ? Colles' fracture of left radius, initial encounter for closed fracture 05/09/2020  ? Osteoarthritis of left hip 11/09/2017  ? S/P hip replacement, left 11/09/2017  ? Primary osteoarthritis of both hips 09/25/2016  ? Fibromyalgia 04/28/2016  ? Primary osteoarthritis of both knees 04/28/2016  ? History of total knee replacement, bilateral 04/28/2016  ? Primary osteoarthritis of both hands 04/28/2016  ? Primary insomnia 04/28/2016  ? History of sleep apnea 04/28/2016  ? History of restless legs syndrome 04/28/2016  ? Osteopenia of multiple sites 04/28/2016  ? Trochanteric bursitis of  left hip 02/03/2016  ? Hiatal hernia with gastroesophageal reflux 11/06/2015  ? Hiatal hernia 03/25/2015  ? Gastroesophageal reflux disease with esophagitis 01/17/2015  ? Ligamentous laxity of knee 01/05/2011  ? Knee effusion, right 11/10/2010  ? Knee pain 11/10/2010  ? Incisional hernia 10/16/2010  ? HYPERTENSION 05/14/2010  ? OBSTRUCTIVE SLEEP APNEA 05/14/2010  ? Obesity 04/08/2009  ? ? ?PCP: Tisovec, Fransico Him, MD ? ?REFERRING PROVIDER: Dr Estanislado Pandy ? ?REFERRING DIAG: M51.36 (ICD-10-CM) - DDD (degenerative disc disease), lumbar  Fall prevention  ? ?THERAPY DIAG:  ?Difficulty in walking, not elsewhere classified ? ?Muscle spasm of back ? ?Muscle weakness (generalized) ? ?ONSET DATE:  04/2020 ? ?SUBJECTIVE:                                                                                                                                                                                          ? ?SUBJECTIVE STATEMENT: ?I rode my bike for 2 miles yesterday.   ? ?PERTINENT HISTORY:  ?Bil. TKA, Fibromyalgia, closed compression Fx of L2, osteopenia with DEXA 04/29/20  T-score of -1.2 of R hip. Fall Feb 2022 with compression Fx of L2 ? ?PAIN:  ?Are you having pain? Yes ?NPRS scale: 3-4/10 ?Pain location: back ?Pain orientation: Bilateral  ?PAIN TYPE: aching ?Pain description: intermittent and aching  ?Aggravating factors: standing, activity  ?Relieving factors: rest, stretching  ? ?PRECAUTIONS: Fall and Other: Osteoporosis ? ?PATIENT GOALS To relieve the pain in some way and help get better mobility.  ? ? ?OBJECTIVE:  ? ?Initial evaluation 04/01/21: ?PATIENT SURVEYS:  ?FOTO 46 (goal is 52) ?POSTURE: Rounded Shoulders;Forward head  ?LE MMT: ?Bil LE strength of 4/5 grossly throughout  ? ?5x sit to stand: 18.5 sec with UE pushing up from thighs  ?Normal TUG (seconds) 13.1   with SPC  ? ?05/14/21 ?TUG: 12.3 seconds ?5x sit to stand: 12.81 seconds  ?FOTO: 36 (declined from eval-46 at evaluation) ? ?05/28/21:  ? ?TODAY'S TREATMENT  ?05/28/21: ?Nustep L4 x6 min with PT present to discuss status ?Seated with 2#: LAQ, marching, hip abduction scissors. 2x10 bilat each ?Sit to/from chair without pad x10 ?Seated crunches holding 6# 2x10 ?DRY NEEDLING: ?Dry needling consent given? yes ?Educational handouts provided? yes ?Muscles treated: bil gluteals  ?Response from dry needling: twitch response, improved muscle length ?Manual: skilled palpation and monitoring during DN.  Elongation and tissue mobility s/p needling.    ?05/26/2021: ?Nustep L4 x6 min with PT present to discuss status ?Seated with 2#: LAQ, marching, hip abduction scissors. 2x10 bilat each ?Sit to/from chair without pad x10 ?Seated hip abduction with red band: Lt  only 2x10 ?Seated crunches holding 6#  2x10 ?DRY NEEDLING: ?Dry needling consent given? yes ?Educational handouts provided? yes ?Muscles treated: Lumbar multifidi ?Response from dry needling: twitch response, improved muscle length ?Manual: skilled palpation and monitoring during DN.  Elongation and tissue mobility s/p needling.    ?05/21/2021: ?Nustep L4 x6 min with PT present to discuss status ?Seated with 2#: LAQ, marching, hip abduction scissors. 2x10 bilat each ?Sit to/from chair without pad x10 ?Standing heel raises and 3 way hip x10 bilat ?Steps: up/down 6" steps x 4 steps with rail on Lt.   Step-to leading with Lt  with ascending and leading with Rt descending- PT advised that she practice Lt step up on single step at home to get ready for beach trip.  ?Seated hip abduction with red band: Lt only 2x10 ?Seated crunches holding 6# 2x10 ?Tandem stance with mod UE support  ? ?HOME EXERCISE PROGRAM: ?PATIENT EDUCATION: ?EAccess Code: [email or web address] ?URL: [email or web address] ?Date: 05/19/2021 ?Prepared by: Claiborne Billings ? ?Exercises ?05/19/21 ?Seated Single Leg Hip Abduction with Resistance - 2 x daily - 7 x weekly - 2 sets - 10 reps ? ?Clinical Impression:  ?Pt is going to be out of town and then returning to her home in [place] so will D/C today to [facility]. Pt is able to tolerate walking x20 min with her cane and is now riding her stationary bike x 5 miles at home.  FOTO is improved to 24, meeting goal.  Pt will D/C to [facility]P and will follow-up with MD as needed.   ? ?GOALS: ?Goals reviewed with patient? Yes ? ?SHORT TERM GOALS: ? ?STG Name Target Date Goal status  ?1 Pt will be independent with initial HEP ?Baseline:  05/02/21 MET  ?     ? ?LONG TERM GOALS:  ? ?LTG Name Target Date Goal status  ?1 Pt will be independent with advanced HEP ?Baseline: independent and compliant in current HEP 06/20/21 MET  ?2 Pt will increase FOTO to at least 52% to demonstrate improved functional mobility.   ? ?Baseline:36 06/20/21 IN PROGRESS  ?3  Pt will be able to ambulate for greater than 20 minutes with LRAD to allow her to go shopping at a small store.   ? ?Baseline: 20 minutes-05/26/21 06/20/21 MET  ?4 Pt will increase BLE strength to at least 4+/5 to allow

## 2021-06-25 NOTE — Progress Notes (Signed)
? ?Office Visit Note ? ?Patient: Miranda Romero             ?Date of Birth: January 05, 1946           ?MRN: [id number]             ?PCP: Gaspar Garbe, MD ?Referring: Gaspar Garbe, MD ?Visit Date: 07/09/2021 ?Occupation: @GUAROCC @ ? ?Subjective:  ?Generalized pain ? ?History of Present Illness: F [name] is a 76 y.o. female with history of polymyalgia, osteoarthritis and fibromyalgia syndrome.  She had been off prednisone since December 2022.  She has not had any recurrence of polymyalgia symptoms.  She continues to have generalized pain and discomfort from fibromyalgia.  She also has discomfort underlying osteoarthritis.  She has degenerative disease of lumbar spine with a spinal stenosis.  She has been seeing Dr. January 2023.  She states she has to sit down frequently due to lower back pain and radiculopathy.  She discontinued Cymbalta due to tremors.  She has increased gabapentin.  She has noticed increased edema on her lower extremities.  She states she tried pain management in [place] which was not very helpful.  She has another appointment coming up at pain management in June.  She states she is in constant pain.  She has flares of discomfort when she describes her pain on the scale of 0-10 about 9. ? ?Activities of Daily Living:  ?Patient reports morning stiffness for 1 hour.   ?Patient Denies nocturnal pain.  ?Difficulty dressing/grooming: Denies ?Difficulty climbing stairs: Reports ?Difficulty getting out of chair: Denies ?Difficulty using hands for taps, buttons, cutlery, and/or writing: Denies ? ?Review of Systems  ?Constitutional:  Negative for fatigue.  ?HENT:  Negative for mouth sores, mouth dryness and nose dryness.   ?Eyes:  Negative for pain, itching and dryness.  ?Respiratory:  Negative for shortness of breath and difficulty breathing.   ?Cardiovascular:  Positive for swelling in legs/feet. Negative for chest pain and palpitations.  ?Gastrointestinal:  Negative for blood in stool,  constipation and diarrhea.  ?Endocrine: Positive for increased urination.  ?Genitourinary:  Negative for difficulty urinating.  ?Musculoskeletal:  Positive for myalgias, morning stiffness, muscle tenderness and myalgias. Negative for joint pain, joint pain and joint swelling.  ?Skin:  Negative for color change, rash and redness.  ?Allergic/Immunologic: Negative for susceptible to infections.  ?Neurological:  Positive for weakness. Negative for dizziness, numbness, headaches and memory loss.  ?Hematological:  Positive for bruising/bleeding tendency.  ?Psychiatric/Behavioral:  Negative for confusion.   ? ?PMFS History:  ?Patient Active Problem List  ? Diagnosis Date Noted  ? Lumbar compression fracture (HCC) 06/19/2020  ? Colles' fracture of left radius, initial encounter for closed fracture 05/09/2020  ? Osteoarthritis of left hip 11/09/2017  ? S/P hip replacement, left 11/09/2017  ? Primary osteoarthritis of both hips 09/25/2016  ? Fibromyalgia 04/28/2016  ? Primary osteoarthritis of both knees 04/28/2016  ? History of total knee replacement, bilateral 04/28/2016  ? Primary osteoarthritis of both hands 04/28/2016  ? Primary insomnia 04/28/2016  ? History of sleep apnea 04/28/2016  ? History of restless legs syndrome 04/28/2016  ? Osteopenia of multiple sites 04/28/2016  ? Trochanteric bursitis of left hip 02/03/2016  ? Hiatal hernia with gastroesophageal reflux 11/06/2015  ? Hiatal hernia 03/25/2015  ? Gastroesophageal reflux disease with esophagitis 01/17/2015  ? Ligamentous laxity of knee 01/05/2011  ? Knee effusion, right 11/10/2010  ? Knee pain 11/10/2010  ? Incisional hernia 10/16/2010  ? HYPERTENSION 05/14/2010  ? OBSTRUCTIVE SLEEP  APNEA 05/14/2010  ? Obesity 04/08/2009  ?  ?Past Medical History:  ?Diagnosis Date  ? Arthritis   ? Blood transfusion   ? Compression fracture of L3 vertebra (HCC)   ? Fibromyalgia   ? Dr Corliss Skainseveshwar  ? GERD (gastroesophageal reflux disease)   ? Hiatal hernia with gastroesophageal  reflux 11/06/2015  ? History of hiatal hernia   ? Hyperlipidemia   ? Hypertension   ? Dr Wylene Simmerisovec LOV 4/13 on chart  ? Seasonal allergies   ? Sleep apnea   ? SEVERE  per sleep study 3/12 EPIC no CPAP   ? Transfusion history   ? 3 yrs ago s/p surgery for knee  ? Umbilical hernia   ? Weight decrease   ? Wrist fracture   ? Left wrist  ?  ?Family History  ?Problem Relation Age of Onset  ? Lymphoma Mother   ? ?Past Surgical History:  ?Procedure Laterality Date  ? ANKLE SURGERY    ? APPENDECTOMY  2014  ? BALLOON DILATION N/A 06/05/2015  ? Procedure: BALLOON DILATION;  Surgeon: Willis ModenaWilliam Outlaw, MD;  [facility];  Service: Endoscopy;  Laterality: N/A;  ? CATARACT EXTRACTION Bilateral 05/2013  ? CHOLECYSTECTOMY    ? DILATION AND CURETTAGE OF UTERUS    ? ESOPHAGOGASTRODUODENOSCOPY (EGD) WITH PROPOFOL N/A 06/05/2015  ? Procedure: ESOPHAGOGASTRODUODENOSCOPY (EGD) WITH PROPOFOL;  Surgeon: Willis ModenaWilliam Outlaw, MD;  [facility];  Service: Endoscopy;  Laterality: N/A;  ? HERNIA REPAIR  05/2013  ? due to appendectomy with mesh  ? HIATAL HERNIA REPAIR N/A 11/06/2015  ? Procedure: LAPAROSCOPIC REPAIR HIATAL HERNIA, Nissen FUNDOPLICATION;  Surgeon: Claud KelpHaywood Ingram, MD;  [facility];  Service: General;  Laterality: N/A;  ? JOINT REPLACEMENT    ? KNEE ARTHROPLASTY Bilateral   ? knee infection Right   ? knee hardware removed 11/12/   replaced 1/13  ? spine injection    ? [facility]  ? TOTAL HIP ARTHROPLASTY Left 11/09/2017  ? Procedure: LEFT TOTAL HIP ARTHROPLASTY;  Surgeon: Valeria BatmanWhitfield, Peter W, MD;  Location: [facility];  Service: Orthopedics;  Laterality: Left;  ? VENTRAL HERNIA REPAIR  07/21/2011  ? Procedure: LAPAROSCOPIC VENTRAL HERNIA;  Surgeon: Ernestene MentionHaywood M Ingram, MD;  Location[facility];  Service: General;  Laterality: N/A;  Laparoscopic Repair Verntal Incisional Hernia and Mesh  ? ?Social History  ? ?Social History Narrative  ? Lives at home with her husband  ? [place] second home in [place] where [place] spend a lot of  time  ? Right handed  ? Caffeine: 2 cups daily  ? ?Immunization History  ?Administered Date(s) Administered  ? Influenza Whole 11/18/2009  ? Influenza, High Dose Seasonal PF 01/05/2017, 12/09/2017  ? Influenza-Unspecified 11/29/2010, 01/18/2015, 11/30/2015  ? PFIZER(Purple Top)SARS-COV-2 Vaccination 04/22/2019, 05/17/2019, 11/18/2019, 07/25/2020  ? Research officer, [employer] Covid-19 Vaccine Bivalent Booster 6560yrs & up 12/09/2020  ? Pneumococcal Polysaccharide-23 11/29/2007, 04/16/2008  ? Tdap 04/21/2018  ? Zoster Recombinat (Shingrix) 10/18/2017  ?  ? ?Objective: ?Vital Signs: BP 110/75 (BP Location: Right Arm, Patient Position: Sitting, Cuff Size: Large)   Pulse 86   Ht 5\' 5"  (1.651 m)   Wt 227 lb 6.4 oz (103.1 kg)   LMP 09/20/2000 (Exact Date)   BMI 37.84 kg/m?   ? ?Physical Exam ?Vitals and nursing note reviewed.  ?Constitutional:   ?   Appearance: She is well-developed.  ?HENT:  ?   Head: Normocephalic and atraumatic.  ?Eyes:  ?   Conjunctiva/sclera: Conjunctivae normal.  ?Cardiovascular:  ?   Rate  and Rhythm: Normal rate and regular rhythm.  ?   Heart sounds: Normal heart sounds.  ?Pulmonary:  ?   Effort: Pulmonary effort is normal.  ?   Breath sounds: Normal breath sounds.  ?Abdominal:  ?   General: Bowel sounds are normal.  ?   Palpations: Abdomen is soft.  ?Musculoskeletal:  ?   Cervical back: Normal range of motion.  ?Lymphadenopathy:  ?   Cervical: No cervical adenopathy.  ?Skin: ?   General: Skin is warm and dry.  ?   Capillary Refill: Capillary refill takes less than 2 seconds.  ?Neurological:  ?   Mental Status: She is alert and oriented to person, place, and time.  ?Psychiatric:     ?   Behavior: Behavior normal.  ?  ? ?Musculoskeletal Exam: She had good range of motion of the cervical spine.  She had thoracic kyphosis.  She had limited painful range of motion of her lumbar spine.  Shoulder joints, elbow joints, wrist joints with good range of motion.  She has thickening of her left wrist joint due to previous  fracture.  She had toe DIP and PIP prominence and a mucinous cyst on her right first PIP joint.  Hip joints were difficult to assess in the sitting position.  Bilateral knee joints were replaced.  She had significant pit

## 2021-07-09 ENCOUNTER — Encounter: Payer: Self-pay | Admitting: Rheumatology

## 2021-07-09 ENCOUNTER — Ambulatory Visit: Payer: Medicare PPO | Admitting: Rheumatology

## 2021-07-09 VITALS — BP 110/75 | HR 86 | Ht 65.0 in | Wt 227.4 lb

## 2021-07-09 DIAGNOSIS — M19041 Primary osteoarthritis, right hand: Secondary | ICD-10-CM | POA: Diagnosis not present

## 2021-07-09 DIAGNOSIS — S52532S Colles' fracture of left radius, sequela: Secondary | ICD-10-CM

## 2021-07-09 DIAGNOSIS — M7062 Trochanteric bursitis, left hip: Secondary | ICD-10-CM

## 2021-07-09 DIAGNOSIS — M797 Fibromyalgia: Secondary | ICD-10-CM

## 2021-07-09 DIAGNOSIS — Z96653 Presence of artificial knee joint, bilateral: Secondary | ICD-10-CM

## 2021-07-09 DIAGNOSIS — Z96642 Presence of left artificial hip joint: Secondary | ICD-10-CM | POA: Diagnosis not present

## 2021-07-09 DIAGNOSIS — M8589 Other specified disorders of bone density and structure, multiple sites: Secondary | ICD-10-CM

## 2021-07-09 DIAGNOSIS — M19042 Primary osteoarthritis, left hand: Secondary | ICD-10-CM

## 2021-07-09 DIAGNOSIS — S32020S Wedge compression fracture of second lumbar vertebra, sequela: Secondary | ICD-10-CM | POA: Diagnosis not present

## 2021-07-09 DIAGNOSIS — M353 Polymyalgia rheumatica: Secondary | ICD-10-CM

## 2021-07-09 DIAGNOSIS — M5136 Other intervertebral disc degeneration, lumbar region: Secondary | ICD-10-CM | POA: Diagnosis not present

## 2021-07-09 DIAGNOSIS — Z8719 Personal history of other diseases of the digestive system: Secondary | ICD-10-CM

## 2021-07-09 DIAGNOSIS — F5101 Primary insomnia: Secondary | ICD-10-CM

## 2021-07-09 DIAGNOSIS — Z8679 Personal history of other diseases of the circulatory system: Secondary | ICD-10-CM

## 2021-07-09 DIAGNOSIS — Z8669 Personal history of other diseases of the nervous system and sense organs: Secondary | ICD-10-CM

## 2021-08-13 ENCOUNTER — Encounter: Payer: Self-pay | Admitting: Rheumatology

## 2021-09-10 DIAGNOSIS — M9904 Segmental and somatic dysfunction of sacral region: Secondary | ICD-10-CM | POA: Diagnosis not present

## 2021-09-10 DIAGNOSIS — M797 Fibromyalgia: Secondary | ICD-10-CM | POA: Diagnosis not present

## 2021-09-10 DIAGNOSIS — M47816 Spondylosis without myelopathy or radiculopathy, lumbar region: Secondary | ICD-10-CM | POA: Diagnosis not present

## 2021-09-26 DIAGNOSIS — H35723 Serous detachment of retinal pigment epithelium, bilateral: Secondary | ICD-10-CM | POA: Diagnosis not present

## 2021-09-29 IMAGING — MR MR LUMBAR SPINE W/O CM
4 of 5 series · 26 of 48 positions shown · non-contrast
Comparison: Lumbar radiographs from 06/19/2020 and CT scan from
08/22/2020

CLINICAL DATA: Severe back pain since a fall in Thursday April, 2020.

EXAM:
MRI LUMBAR SPINE WITHOUT CONTRAST
TECHNIQUE: Multiplanar, multisequence MR imaging of the lumbar spine was
performed. No intravenous contrast was administered.

[Series 3: T2 · sagittal · 4.0mm · 1.09mm/px · 6 of 17 slices shown (1 of 2)]
[im 1/17]
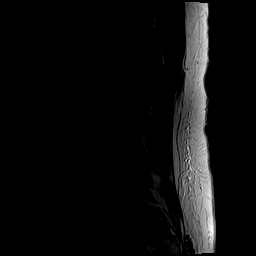
[im 4/17]
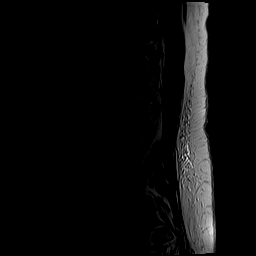
[im 7/17]
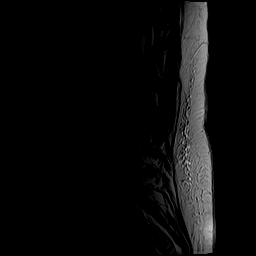
[im 10/17]
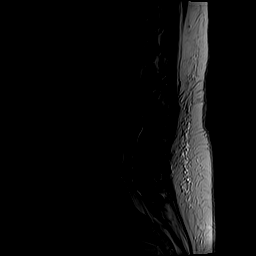
[im 13/17]
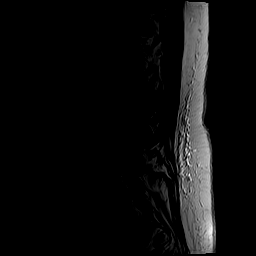
[im 17/17]
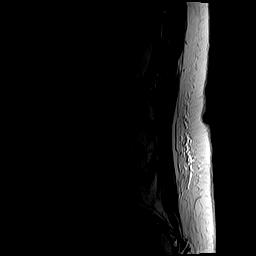

[Series 5: T1 · sagittal · 4.0mm · 1.09mm/px · 6 of 17 slices shown (1 of 2)]
[im 1/17]
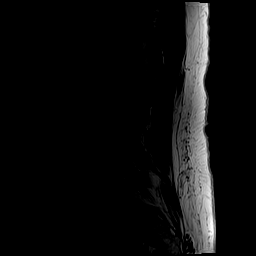
[im 4/17]
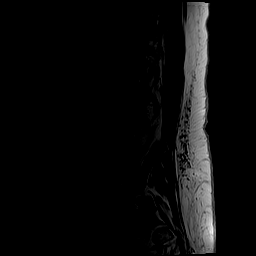
[im 7/17]
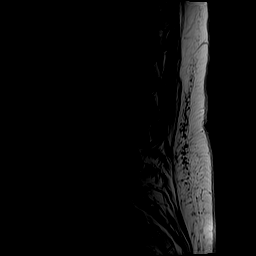
[im 10/17]
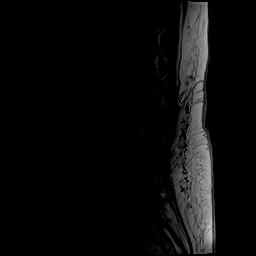
[im 13/17]
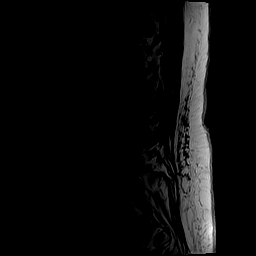
[im 17/17]
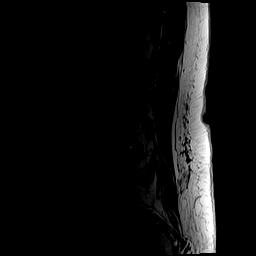

[Series 6: T2 · axial · 4.0mm · 0.39mm/px · z∈[-68,+151]mm · 9 of 42 slices shown (2 of 2)]
[im 1/42]
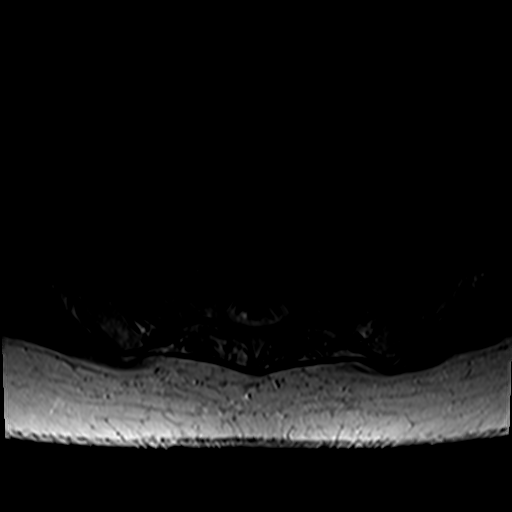
[im 6/42]
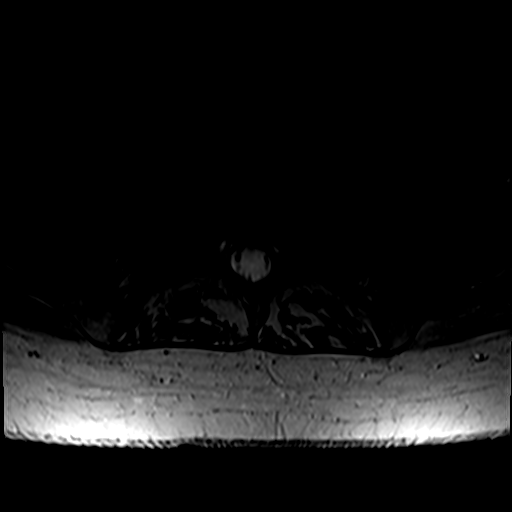
[im 12/42]
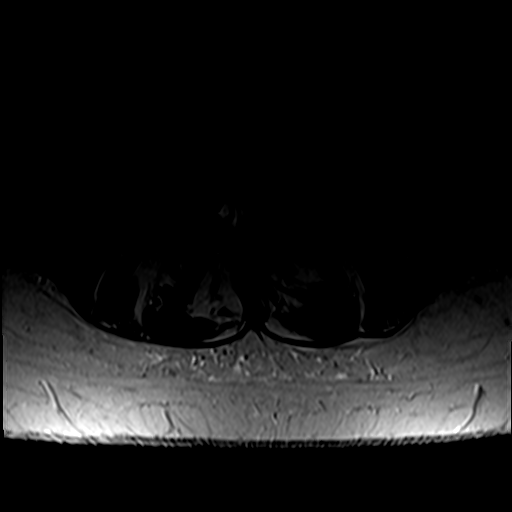
[im 18/42]
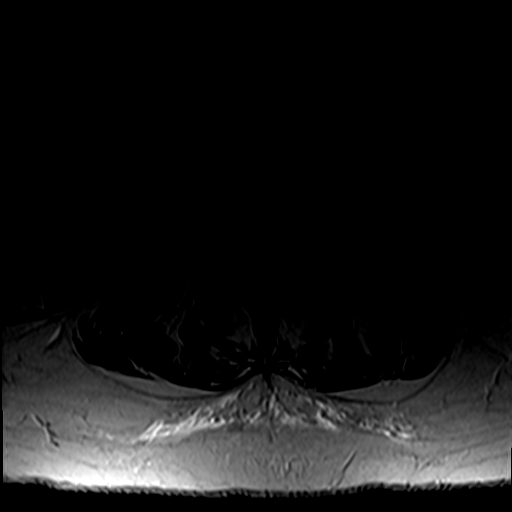
[im 21/42]
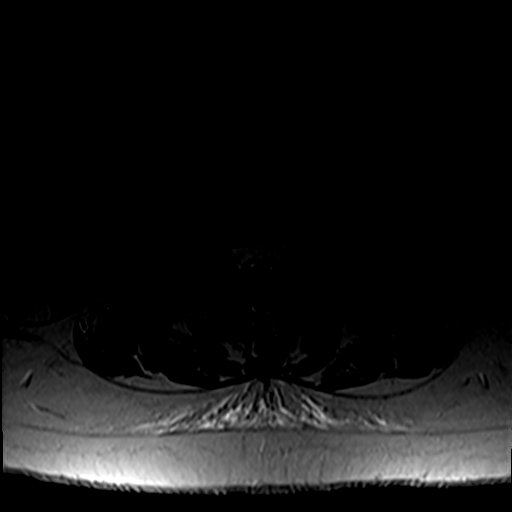
[im 24/42]
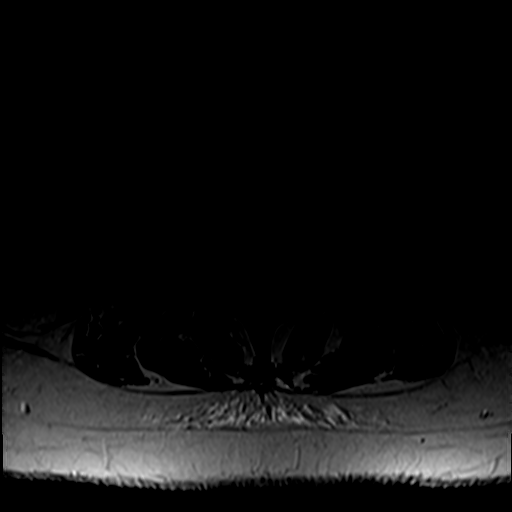
[im 30/42]
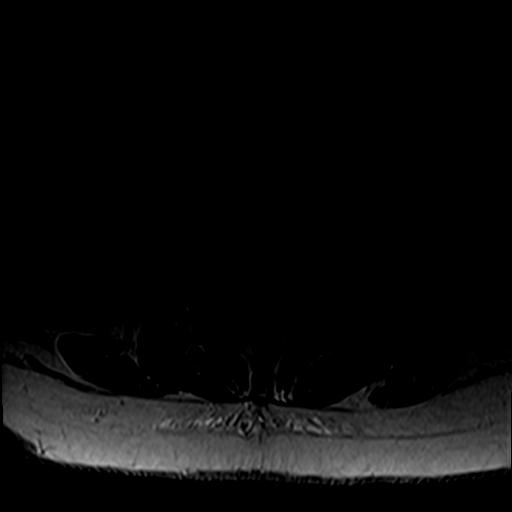
[im 36/42]
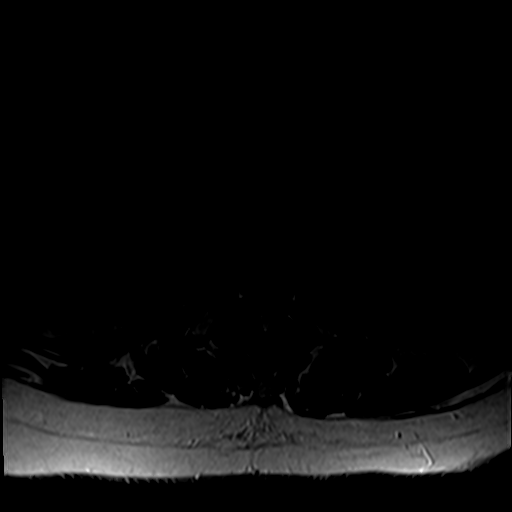
[im 42/42]
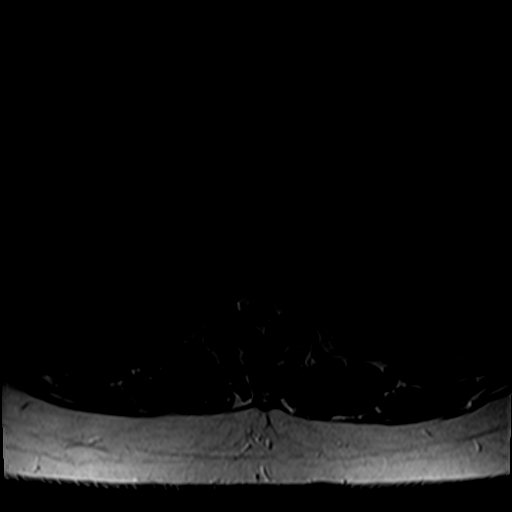

[Series 7: T1 · axial · 4.0mm · 0.39mm/px · z∈[-68,+122]mm · 5 of 42 slices shown (2 of 2)]
[im 1/42]
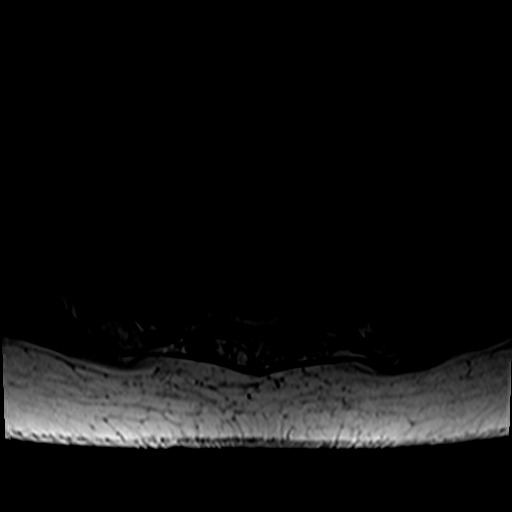
[im 6/42]
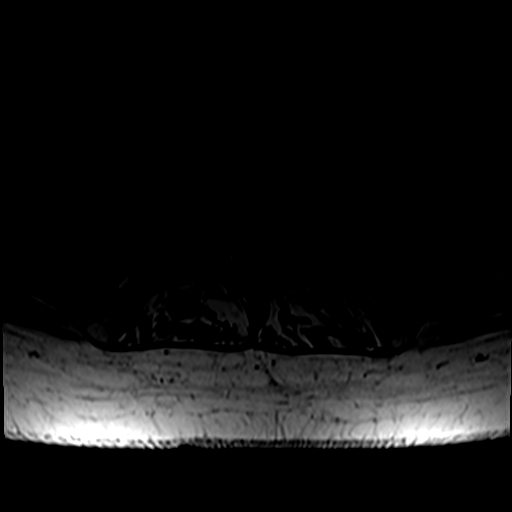
[im 12/42]
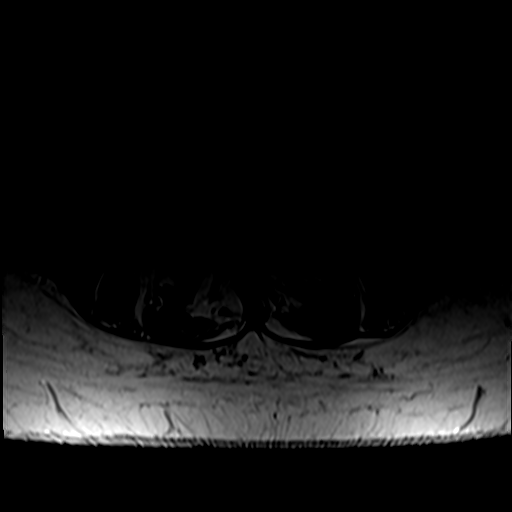
[im 21/42]
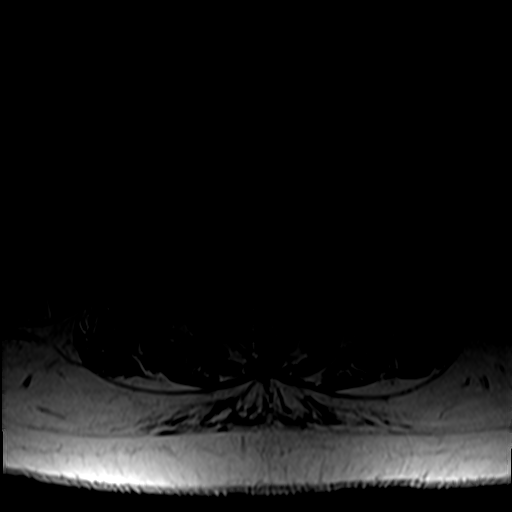
[im 36/42]
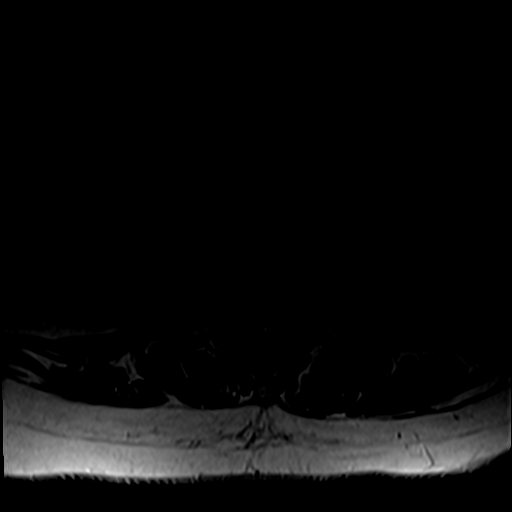

[26 of 48 positions shown; findings below may reference images not displayed]

FINDINGS: Segmentation: There are five lumbar type vertebral bodies. The last
full intervertebral disc space is labeled L5-S1. This correlates
with the CT scan.

Alignment: Normal overall alignment. Mild degenerative
anterolisthesis of L4 is noted due to facet disease.

Vertebrae: Remote superior endplate compression fracture of L2
slightly greater than 50%. Mild retropulsion involving the
posterosuperior aspect of the vertebral body contributing to spinal
stenosis at L1-2. Mild inferior endplate irregularity and slight
compression deformity of L1, also remote. The other lumbar vertebral
bodies are intact. No acute fractures or worrisome bone lesions.

Conus medullaris and cauda equina: Conus extends to the L1 level.
Conus and cauda equina appear normal.

Paraspinal and other soft tissues: No significant paraspinal or
retroperitoneal findings. Small scattered renal cysts are noted. No
retroperitoneal mass or adenopathy. The SI joints appear normal and
the sacrum is intact.

Disc levels:

T12-L1: No significant findings. Mild facet disease.

L1-2: Moderate spinal and bilateral lateral recess stenosis due to
the retropulsion at L2, short pedicles, facet disease and ligamentum
flavum thickening. There is also a component of epidural
lipomatosis.

L2-3: Moderate degenerate disc disease with a bulging degenerated
annulus and mild osteophytic ridging. There is also mild to moderate
facet disease but no focal disc protrusion, significant spinal or
foraminal stenosis.

L3-4: Bulging annulus and moderate facet disease but no significant
spinal stenosis. There is mild to moderate right and mild left
lateral recess stenosis and mild left foraminal encroachment.

L4-5: Bulging uncovered disc, severe facet disease with ligamentum
flavum thickening and buckling. This is more pronounced on the left.
Findings contribute to moderate to moderately severe spinal stenosis
and bilateral lateral recess stenosis, left greater than right. Is
also mild left foraminal stenosis.

L5-S1: Moderate facet disease but no disc protrusions, spinal or
foraminal stenosis.
IMPRESSION: 1. Remote superior endplate compression fracture of L2 and inferior
endplate compression fracture of L1. No acute fractures.
2. Moderate spinal and bilateral lateral recess stenosis at L1-2.
3. Mild to moderate right and mild left lateral recess stenosis and
mild left foraminal encroachment at L3-4.
4. Moderate to moderately severe spinal stenosis and bilateral
lateral recess stenosis, left greater than right at L4-5. There is
also mild left foraminal stenosis.

## 2021-10-06 ENCOUNTER — Other Ambulatory Visit: Payer: Self-pay | Admitting: Physician Assistant

## 2021-10-07 ENCOUNTER — Other Ambulatory Visit: Payer: Self-pay | Admitting: Physician Assistant

## 2021-10-07 NOTE — Telephone Encounter (Signed)
Next Visit: 04/09/2022  Last Visit: 07/09/2021  Last Fill: 10/03/2020  Dx: Primary osteoarthritis of both hands  Current Dose per office note on 07/09/2021: not discussed  Okay to refill Voltaren Gel?

## 2021-10-27 DIAGNOSIS — M810 Age-related osteoporosis without current pathological fracture: Secondary | ICD-10-CM | POA: Diagnosis not present

## 2021-10-27 DIAGNOSIS — N182 Chronic kidney disease, stage 2 (mild): Secondary | ICD-10-CM | POA: Diagnosis not present

## 2021-10-27 DIAGNOSIS — D692 Other nonthrombocytopenic purpura: Secondary | ICD-10-CM | POA: Diagnosis not present

## 2021-10-27 DIAGNOSIS — M545 Low back pain, unspecified: Secondary | ICD-10-CM | POA: Diagnosis not present

## 2021-10-27 DIAGNOSIS — E7800 Pure hypercholesterolemia, unspecified: Secondary | ICD-10-CM | POA: Diagnosis not present

## 2021-10-27 DIAGNOSIS — R7301 Impaired fasting glucose: Secondary | ICD-10-CM | POA: Diagnosis not present

## 2021-10-27 DIAGNOSIS — M797 Fibromyalgia: Secondary | ICD-10-CM | POA: Diagnosis not present

## 2021-10-27 DIAGNOSIS — I129 Hypertensive chronic kidney disease with stage 1 through stage 4 chronic kidney disease, or unspecified chronic kidney disease: Secondary | ICD-10-CM | POA: Diagnosis not present

## 2021-11-10 DIAGNOSIS — M48061 Spinal stenosis, lumbar region without neurogenic claudication: Secondary | ICD-10-CM | POA: Diagnosis not present

## 2021-11-10 DIAGNOSIS — M9904 Segmental and somatic dysfunction of sacral region: Secondary | ICD-10-CM | POA: Diagnosis not present

## 2021-11-10 DIAGNOSIS — M797 Fibromyalgia: Secondary | ICD-10-CM | POA: Diagnosis not present

## 2021-11-10 DIAGNOSIS — M47816 Spondylosis without myelopathy or radiculopathy, lumbar region: Secondary | ICD-10-CM | POA: Diagnosis not present

## 2021-11-11 ENCOUNTER — Other Ambulatory Visit: Payer: Self-pay | Admitting: Rheumatology

## 2021-11-11 NOTE — Telephone Encounter (Signed)
Next Visit: 04/09/2022  Last Visit: 07/09/2021  Last Fill:05/06/2021  Dx: Fibromyalgia  Current Dose per office note on 07/09/2021: not mentioned  Okay to refill Gabapetin?

## 2022-01-12 DIAGNOSIS — M48061 Spinal stenosis, lumbar region without neurogenic claudication: Secondary | ICD-10-CM | POA: Diagnosis not present

## 2022-01-12 DIAGNOSIS — M797 Fibromyalgia: Secondary | ICD-10-CM | POA: Diagnosis not present

## 2022-01-12 DIAGNOSIS — M47816 Spondylosis without myelopathy or radiculopathy, lumbar region: Secondary | ICD-10-CM | POA: Diagnosis not present

## 2022-01-12 DIAGNOSIS — S32020S Wedge compression fracture of second lumbar vertebra, sequela: Secondary | ICD-10-CM | POA: Diagnosis not present

## 2022-01-12 DIAGNOSIS — M9904 Segmental and somatic dysfunction of sacral region: Secondary | ICD-10-CM | POA: Diagnosis not present

## 2022-01-12 DIAGNOSIS — M25511 Pain in right shoulder: Secondary | ICD-10-CM | POA: Diagnosis not present

## 2022-03-16 ENCOUNTER — Other Ambulatory Visit: Payer: Self-pay | Admitting: Physician Assistant

## 2022-03-17 NOTE — Telephone Encounter (Signed)
Next Visit: 04/09/2022   Last Visit: 07/09/2021   Last Fill:11/11/2021   Dx: Fibromyalgia   Current Dose per office note on 07/09/2021: not mentioned   Okay to refill Gabapetin?

## 2022-03-26 NOTE — Progress Notes (Signed)
Office Visit Note  Patient: Miranda Romero             Date of Birth: 1946/02/08           MRN: [id number]             PCP: Haywood Pao, MD Referring: Haywood Pao, MD Visit Date: 04/09/2022 Occupation: @GUAROCC @  Subjective:  Pain in multiple joints and muscles  History of Present Illness: Miranda Romero is a 77 y.o. female with history of polymyalgia rheumatica, osteoarthritis, degenerative disc disease and fibromyalgia syndrome.  Patient states she has not had a flare of polymyalgia rheumatica.  She continues to have pain and discomfort in her multiple joints involving her hands, knee joints and her lower back.  She states she had cortisone injection in her lower back about a year ago which gave her some relief.  She states she has been on naltrexone by a doctor in [place].  She has noticed improvement in her pain level.  She is still taking gabapentin 300 mg twice daily for pain relief.  She is unable to taper gabapentin.  She states despite taking this medication she lives in constant pain.  She is also having a flare of fibromyalgia syndrome.    Activities of Daily Living:  Patient reports morning stiffness for 1 hour.   Patient Reports nocturnal pain.  Difficulty dressing/grooming: Denies Difficulty climbing stairs: Denies Difficulty getting out of chair: Denies Difficulty using hands for taps, buttons, cutlery, and/or writing: Denies  Review of Systems  Constitutional:  Positive for fatigue.  HENT:  Negative for mouth sores and mouth dryness.   Eyes:  Negative for dryness.  Respiratory:  Negative for shortness of breath.   Cardiovascular:  Negative for chest pain and palpitations.  Gastrointestinal:  Negative for blood in stool, constipation and diarrhea.  Endocrine: Negative for increased urination.  Genitourinary:  Positive for nocturia. Negative for involuntary urination.  Musculoskeletal:  Positive for joint pain, joint pain, joint swelling,  myalgias, muscle weakness, morning stiffness, muscle tenderness and myalgias. Negative for gait problem.  Skin:  Negative for color change, rash, hair loss and sensitivity to sunlight.  Allergic/Immunologic: Negative for susceptible to infections.  Neurological:  Negative for dizziness and headaches.  Hematological:  Negative for swollen glands.  Psychiatric/Behavioral:  Positive for depressed mood. Negative for sleep disturbance. The patient is nervous/anxious.     PMFS History:  Patient Active Problem List   Diagnosis Date Noted   Lumbar compression fracture ([place]) 06/19/2020   Colles' fracture of left radius, initial encounter for closed fracture 05/09/2020   Osteoarthritis of left hip 11/09/2017   S/P hip replacement, left 11/09/2017   Primary osteoarthritis of both hips 09/25/2016   Fibromyalgia 04/28/2016   Primary osteoarthritis of both knees 04/28/2016   History of total knee replacement, bilateral 04/28/2016   Primary osteoarthritis of both hands 04/28/2016   Primary insomnia 04/28/2016   History of sleep apnea 04/28/2016   History of restless legs syndrome 04/28/2016   Osteopenia of multiple sites 04/28/2016   Trochanteric bursitis of left hip 02/03/2016   Hiatal hernia with gastroesophageal reflux 11/06/2015   Hiatal hernia 03/25/2015   Gastroesophageal reflux disease with esophagitis 01/17/2015   Ligamentous laxity of knee 01/05/2011   Knee effusion, right 11/10/2010   Knee pain 11/10/2010   Incisional hernia 10/16/2010   HYPERTENSION 05/14/2010   OBSTRUCTIVE SLEEP APNEA 05/14/2010   Obesity 04/08/2009    Past Medical History:  Diagnosis Date   Arthritis  Blood transfusion    Compression fracture of L3 vertebra (HCC)    Fibromyalgia    Dr Corliss Skains   GERD (gastroesophageal reflux disease)    Hiatal hernia with gastroesophageal reflux 11/06/2015   History of hiatal hernia    Hyperlipidemia    Hypertension    Dr Wylene Simmer LOV 4/13 on chart   Seasonal allergies     Sleep apnea    SEVERE  per sleep study 3/12 EPIC no CPAP    Transfusion history    3 yrs ago s/p surgery for knee   Umbilical hernia    Weight decrease    Wrist fracture    Left wrist    Family History  Problem Relation Age of Onset   Lymphoma Mother    Past Surgical History:  Procedure Laterality Date   ANKLE SURGERY     APPENDECTOMY  2014   BALLOON DILATION N/A 06/05/2015   Procedure: Marvis Repress DILATION;  Surgeon: Willis Modena, MD;  Location: [facility];  Service: Endoscopy;  Laterality: N/A;   CATARACT EXTRACTION Bilateral 05/2013   CHOLECYSTECTOMY     DILATION AND CURETTAGE OF UTERUS     ESOPHAGOGASTRODUODENOSCOPY (EGD) WITH PROPOFOL N/A 06/05/2015   Procedure: ESOPHAGOGASTRODUODENOSCOPY (EGD) WITH PROPOFOL;  Surgeon: Willis Modena, MD;  [facility]: [facility];  Service: Endoscopy;  Laterality: N/A;   HERNIA REPAIR  05/2013   due to appendectomy with mesh   HIATAL HERNIA REPAIR N/A 11/06/2015   Procedure: LAPAROSCOPIC REPAIR HIATAL HERNIA, Nissen FUNDOPLICATION;  Surgeon: Claud Kelp, MD;  Location: [facility];  Service: General;  Laterality: N/A;   JOINT REPLACEMENT     KNEE ARTHROPLASTY Bilateral    knee infection Right    knee hardware removed 11/12/   replaced 1/13   spine injection     [facility]   TOTAL HIP ARTHROPLASTY Left 11/09/2017   Procedure: LEFT TOTAL HIP ARTHROPLASTY;  Surgeon: Valeria Batman, MD;  Location: [facility];  Service: Orthopedics;  Laterality: Left;   VENTRAL HERNIA REPAIR  07/21/2011   Procedure: LAPAROSCOPIC VENTRAL HERNIA;  Surgeon: Ernestene Mention, MD;  Location: [facility];  Service: General;  Laterality: N/A;  Laparoscopic Repair Verntal Incisional Hernia and Mesh   Social History   Social History Narrative   Lives at home with her husband   They have second home in [place] where they spend a lot of time   Right handed   Caffeine: 2 cups daily   Immunization History  Administered Date(s) Administered   Influenza Whole  11/18/2009   Influenza, High Dose Seasonal PF 01/05/2017, 12/09/2017   Influenza-Unspecified 11/29/2010, 01/18/2015, 11/30/2015   PFIZER(Purple Top)SARS-COV-2 Vaccination 04/22/2019, 05/17/2019, 11/18/2019, 07/25/2020   Pfizer Covid-19 Vaccine Bivalent Booster 75yrs & up 12/09/2020   Pneumococcal Polysaccharide-23 11/29/2007, 04/16/2008   Tdap 04/21/2018   Zoster Recombinat (Shingrix) 10/18/2017     Objective: Vital Signs: BP 118/83 (BP Location: Left Arm, Patient Position: Sitting, Cuff Size: Large)   Pulse 89   Resp 17   Ht 5\' 5"  (1.651 m)   Wt 217 lb (98.4 kg)   LMP 09/20/2000 (Exact Date)   BMI 36.11 kg/m    Physical Exam Vitals and nursing note reviewed.  Constitutional:      Appearance: She is well-developed.  HENT:     Head: Normocephalic and atraumatic.  Eyes:     Conjunctiva/sclera: Conjunctivae normal.  Cardiovascular:     Rate and Rhythm: Normal rate and regular rhythm.     Heart sounds: Normal heart sounds.  Pulmonary:  Effort: Pulmonary effort is normal.     Breath sounds: Normal breath sounds.  Abdominal:     General: Bowel sounds are normal.     Palpations: Abdomen is soft.  Musculoskeletal:     Cervical back: Normal range of motion.  Lymphadenopathy:     Cervical: No cervical adenopathy.  Skin:    General: Skin is warm and dry.     Capillary Refill: Capillary refill takes less than 2 seconds.  Neurological:     Mental Status: She is alert and oriented to person, place, and time.  Psychiatric:        Behavior: Behavior normal.      Musculoskeletal Exam: Cervical spine was in good range of motion.  She had painful limited range of motion of the lumbar spine.  Shoulders, elbows, wrist joints in good range of motion.  She had bilateral PIP and DIP thickening.  Hip joints and knee joints with good range of motion.  She had bilateral knee joint replacement.  She had no tenderness over ankles or MTPs.  CDAI Exam: CDAI Score: -- Patient Global: --;  Provider Global: -- Swollen: --; Tender: -- Joint Exam 04/09/2022   No joint exam has been documented for this visit   There is currently no information documented on the homunculus. Go to the Rheumatology activity and complete the homunculus joint exam.  Investigation: No additional findings.  Imaging: No results found.  Recent Labs: Lab Results  Component Value Date   WBC 22.0 (H) 08/22/2020   HGB 17.8 (H) 08/22/2020   PLT 401 (H) 08/22/2020   NA 138 08/22/2020   K 5.1 08/22/2020   CL 107 08/22/2020   CO2 21 (L) 08/22/2020   GLUCOSE 146 (H) 08/22/2020   BUN 24 (H) 08/22/2020   CREATININE 0.86 08/22/2020   BILITOT 1.6 (H) 08/22/2020   ALKPHOS 46 08/22/2020   AST 96 (H) 08/22/2020   ALT 24 08/22/2020   PROT 4.8 (L) 08/22/2020   ALBUMIN 2.4 (L) 08/22/2020   CALCIUM 7.4 (L) 08/22/2020   GFRAA >60 11/11/2017   QFTBGOLDPLUS NEGATIVE 04/09/2020    Speciality Comments: No specialty comments available.  Procedures:  No procedures performed Allergies: Sudafed [pseudoephedrine hcl]   Assessment / Plan:     Visit Diagnoses: Polymyalgia rheumatica (HCC) -she denies any muscular weakness or tenderness.  She had been off prednisone since December 2022 without any recurrence of PMR symptoms.  Primary osteoarthritis of both hands-she had bilateral PIP and DIP thickening.  No synovitis was noted.  Closed Colles' fracture of left radius, sequela - Dr. Ophelia Charter.  She has residual thickening of her left wrist joint.  Status post total hip replacement, left-she had good range of motion without discomfort.  History of total knee replacement, bilateral-she had good range of motion without discomfort.  She continues to have chronic discomfort in her knee joints.  She has started taking a walker when she goes for a long walk and uses a seat to take breaks.  Her mobility has improved per her husband.  Lower extremity muscle strengthening exercises were emphasized.  DDD (degenerative disc  disease), lumbar -she continues to have lower back pain.  She states her pain has been better since she had cortisone injections last year.  Pain management at [facility] clinic where she had L4-L5 facet joint injection x2.  She has been followed by Dr. Ophelia Charter.  Closed compression fracture of L2 vertebra, sequela-after a fall.  Osteopenia of multiple sites - bone density from April 29, 2020 which showed a T score of -1.2 in the right total hip.  She plans to have repeat DEXA scan this year by her PCP.  Fibromyalgia - She had to come off Cymbalta due to tremors.  She has been taking gabapentin 300 mg p.o. twice daily.  She is off tramadol.  She was prescribed naltrexone in [place].  She states the pain level has improved but she still continues to have chronic pain.  Need for regular exercise and stretching was discussed.  Primary insomnia-good sleep hygiene was discussed.  History of restless legs syndrome  History of gastroesophageal reflux (GERD) - She is followed by gastroenterology.  History of sleep apnea  History of hypertension-blood pressure was low today at 118/83.  Orders: No orders of the defined types were placed in this encounter.  No orders of the defined types were placed in this encounter.   Follow-Up Instructions: Return in about 6 months (around 10/08/2022) for FMS, Osteoarthritis.   Bo Merino, MD  Note - This record has been created using Editor, commissioning.  Chart creation errors have been sought, but may not always  have been located. Such creation errors do not reflect on  the standard of medical care.

## 2022-04-09 ENCOUNTER — Ambulatory Visit: Payer: Medicare PPO | Attending: Rheumatology | Admitting: Rheumatology

## 2022-04-09 ENCOUNTER — Encounter: Payer: Self-pay | Admitting: Rheumatology

## 2022-04-09 VITALS — BP 118/83 | HR 89 | Resp 17 | Ht 65.0 in | Wt 217.0 lb

## 2022-04-09 DIAGNOSIS — M8589 Other specified disorders of bone density and structure, multiple sites: Secondary | ICD-10-CM | POA: Diagnosis not present

## 2022-04-09 DIAGNOSIS — Z96653 Presence of artificial knee joint, bilateral: Secondary | ICD-10-CM | POA: Diagnosis not present

## 2022-04-09 DIAGNOSIS — Z8719 Personal history of other diseases of the digestive system: Secondary | ICD-10-CM

## 2022-04-09 DIAGNOSIS — S32020S Wedge compression fracture of second lumbar vertebra, sequela: Secondary | ICD-10-CM | POA: Diagnosis not present

## 2022-04-09 DIAGNOSIS — M5136 Other intervertebral disc degeneration, lumbar region: Secondary | ICD-10-CM

## 2022-04-09 DIAGNOSIS — S52532S Colles' fracture of left radius, sequela: Secondary | ICD-10-CM

## 2022-04-09 DIAGNOSIS — M353 Polymyalgia rheumatica: Secondary | ICD-10-CM

## 2022-04-09 DIAGNOSIS — M797 Fibromyalgia: Secondary | ICD-10-CM | POA: Diagnosis not present

## 2022-04-09 DIAGNOSIS — F5101 Primary insomnia: Secondary | ICD-10-CM

## 2022-04-09 DIAGNOSIS — M51369 Other intervertebral disc degeneration, lumbar region without mention of lumbar back pain or lower extremity pain: Secondary | ICD-10-CM

## 2022-04-09 DIAGNOSIS — M19041 Primary osteoarthritis, right hand: Secondary | ICD-10-CM | POA: Diagnosis not present

## 2022-04-09 DIAGNOSIS — Z96642 Presence of left artificial hip joint: Secondary | ICD-10-CM

## 2022-04-09 DIAGNOSIS — Z8679 Personal history of other diseases of the circulatory system: Secondary | ICD-10-CM

## 2022-04-09 DIAGNOSIS — M19042 Primary osteoarthritis, left hand: Secondary | ICD-10-CM

## 2022-04-09 DIAGNOSIS — Z8669 Personal history of other diseases of the nervous system and sense organs: Secondary | ICD-10-CM

## 2022-04-16 DIAGNOSIS — Z1231 Encounter for screening mammogram for malignant neoplasm of breast: Secondary | ICD-10-CM | POA: Diagnosis not present

## 2022-04-20 DIAGNOSIS — L821 Other seborrheic keratosis: Secondary | ICD-10-CM | POA: Diagnosis not present

## 2022-04-20 DIAGNOSIS — L720 Epidermal cyst: Secondary | ICD-10-CM | POA: Diagnosis not present

## 2022-04-20 DIAGNOSIS — D1801 Hemangioma of skin and subcutaneous tissue: Secondary | ICD-10-CM | POA: Diagnosis not present

## 2022-04-20 DIAGNOSIS — L814 Other melanin hyperpigmentation: Secondary | ICD-10-CM | POA: Diagnosis not present

## 2022-04-20 DIAGNOSIS — Z85828 Personal history of other malignant neoplasm of skin: Secondary | ICD-10-CM | POA: Diagnosis not present

## 2022-04-20 DIAGNOSIS — L738 Other specified follicular disorders: Secondary | ICD-10-CM | POA: Diagnosis not present

## 2022-05-15 ENCOUNTER — Other Ambulatory Visit: Payer: Self-pay | Admitting: Physician Assistant

## 2022-05-16 ENCOUNTER — Other Ambulatory Visit: Payer: Self-pay | Admitting: Physician Assistant

## 2022-05-21 DIAGNOSIS — E7800 Pure hypercholesterolemia, unspecified: Secondary | ICD-10-CM | POA: Diagnosis not present

## 2022-05-21 DIAGNOSIS — R7301 Impaired fasting glucose: Secondary | ICD-10-CM | POA: Diagnosis not present

## 2022-05-21 DIAGNOSIS — M810 Age-related osteoporosis without current pathological fracture: Secondary | ICD-10-CM | POA: Diagnosis not present

## 2022-05-21 DIAGNOSIS — K219 Gastro-esophageal reflux disease without esophagitis: Secondary | ICD-10-CM | POA: Diagnosis not present

## 2022-05-21 DIAGNOSIS — R7989 Other specified abnormal findings of blood chemistry: Secondary | ICD-10-CM | POA: Diagnosis not present

## 2022-05-21 LAB — COMPREHENSIVE METABOLIC PANEL WITH GFR: EGFR (Non-African Amer.): 69.6

## 2022-05-21 LAB — HEMOGLOBIN A1C: A1c: 5.5

## 2022-05-21 LAB — HM DEXA SCAN

## 2022-05-26 DIAGNOSIS — Z1212 Encounter for screening for malignant neoplasm of rectum: Secondary | ICD-10-CM | POA: Diagnosis not present

## 2022-05-28 DIAGNOSIS — N182 Chronic kidney disease, stage 2 (mild): Secondary | ICD-10-CM | POA: Diagnosis not present

## 2022-05-28 DIAGNOSIS — J302 Other seasonal allergic rhinitis: Secondary | ICD-10-CM | POA: Diagnosis not present

## 2022-05-28 DIAGNOSIS — K219 Gastro-esophageal reflux disease without esophagitis: Secondary | ICD-10-CM | POA: Diagnosis not present

## 2022-05-28 DIAGNOSIS — I129 Hypertensive chronic kidney disease with stage 1 through stage 4 chronic kidney disease, or unspecified chronic kidney disease: Secondary | ICD-10-CM | POA: Diagnosis not present

## 2022-05-28 DIAGNOSIS — R82998 Other abnormal findings in urine: Secondary | ICD-10-CM | POA: Diagnosis not present

## 2022-05-28 DIAGNOSIS — M810 Age-related osteoporosis without current pathological fracture: Secondary | ICD-10-CM | POA: Diagnosis not present

## 2022-05-28 DIAGNOSIS — Z96642 Presence of left artificial hip joint: Secondary | ICD-10-CM | POA: Diagnosis not present

## 2022-05-28 DIAGNOSIS — Z1339 Encounter for screening examination for other mental health and behavioral disorders: Secondary | ICD-10-CM | POA: Diagnosis not present

## 2022-05-28 DIAGNOSIS — N318 Other neuromuscular dysfunction of bladder: Secondary | ICD-10-CM | POA: Diagnosis not present

## 2022-05-28 DIAGNOSIS — M67449 Ganglion, unspecified hand: Secondary | ICD-10-CM | POA: Diagnosis not present

## 2022-05-28 DIAGNOSIS — Z1331 Encounter for screening for depression: Secondary | ICD-10-CM | POA: Diagnosis not present

## 2022-05-28 DIAGNOSIS — Z0000 Encounter for general adult medical examination without abnormal findings: Secondary | ICD-10-CM | POA: Diagnosis not present

## 2022-07-07 ENCOUNTER — Other Ambulatory Visit: Payer: Self-pay | Admitting: Orthopedic Surgery

## 2022-07-07 DIAGNOSIS — M6740 Ganglion, unspecified site: Secondary | ICD-10-CM | POA: Diagnosis not present

## 2022-07-07 DIAGNOSIS — M79644 Pain in right finger(s): Secondary | ICD-10-CM | POA: Diagnosis not present

## 2022-07-07 DIAGNOSIS — M19042 Primary osteoarthritis, left hand: Secondary | ICD-10-CM | POA: Diagnosis not present

## 2022-07-08 DIAGNOSIS — K449 Diaphragmatic hernia without obstruction or gangrene: Secondary | ICD-10-CM | POA: Diagnosis not present

## 2022-07-08 DIAGNOSIS — Z1211 Encounter for screening for malignant neoplasm of colon: Secondary | ICD-10-CM | POA: Diagnosis not present

## 2022-07-08 DIAGNOSIS — K2100 Gastro-esophageal reflux disease with esophagitis, without bleeding: Secondary | ICD-10-CM | POA: Diagnosis not present

## 2022-07-17 ENCOUNTER — Other Ambulatory Visit: Payer: Self-pay | Admitting: Physician Assistant

## 2022-07-20 NOTE — Telephone Encounter (Signed)
Last Fill: 03/17/2022  Next Visit: 10/06/2022  Last Visit: 04/09/2022  Dx: Fibromyalgia   Current Dose per office note on 04/09/2022: gabapentin 300 mg p.o. twice daily   Okay to refill Gabapentin?

## 2022-09-08 DIAGNOSIS — Z6836 Body mass index (BMI) 36.0-36.9, adult: Secondary | ICD-10-CM | POA: Diagnosis not present

## 2022-09-08 DIAGNOSIS — M797 Fibromyalgia: Secondary | ICD-10-CM | POA: Diagnosis not present

## 2022-09-08 DIAGNOSIS — E669 Obesity, unspecified: Secondary | ICD-10-CM | POA: Diagnosis not present

## 2022-09-08 DIAGNOSIS — M1990 Unspecified osteoarthritis, unspecified site: Secondary | ICD-10-CM | POA: Diagnosis not present

## 2022-09-08 DIAGNOSIS — M19041 Primary osteoarthritis, right hand: Secondary | ICD-10-CM | POA: Diagnosis not present

## 2022-09-08 DIAGNOSIS — M7989 Other specified soft tissue disorders: Secondary | ICD-10-CM | POA: Diagnosis not present

## 2022-09-08 DIAGNOSIS — G4733 Obstructive sleep apnea (adult) (pediatric): Secondary | ICD-10-CM | POA: Diagnosis not present

## 2022-09-08 DIAGNOSIS — M67441 Ganglion, right hand: Secondary | ICD-10-CM | POA: Diagnosis not present

## 2022-09-08 DIAGNOSIS — E7800 Pure hypercholesterolemia, unspecified: Secondary | ICD-10-CM | POA: Diagnosis not present

## 2022-09-08 DIAGNOSIS — K219 Gastro-esophageal reflux disease without esophagitis: Secondary | ICD-10-CM | POA: Diagnosis not present

## 2022-09-11 ENCOUNTER — Encounter (HOSPITAL_BASED_OUTPATIENT_CLINIC_OR_DEPARTMENT_OTHER): Payer: Self-pay

## 2022-09-11 ENCOUNTER — Ambulatory Visit (HOSPITAL_BASED_OUTPATIENT_CLINIC_OR_DEPARTMENT_OTHER): Admit: 2022-09-11 | Payer: Medicare PPO | Admitting: Orthopedic Surgery

## 2022-09-11 SURGERY — CYST REMOVAL
Anesthesia: Choice | Laterality: Right

## 2022-09-23 NOTE — Progress Notes (Signed)
Office Visit Note  Patient: Miranda Romero             Date of Birth: [birth year]           MRN: [id number]             PCP: Gaspar Garbe, MD Referring: Gaspar Garbe, MD Visit Date: 10/06/2022 Occupation: @GUAROCC @  Subjective:  Generalized pain  History of Present Illness: Miranda F Belitz is a 77 y.o. female with osteoarthritis, degenerative disc disease and fibromyalgia syndrome.  She returns today after her last visit in January 2024.  She has not had any flares of polymyalgia rheumatica.  She has been off prednisone since December 2022.  She continues to have pain and discomfort in her bilateral hands, left hip and bilateral knee joints which are replaced.  She has lower back pain which is constant.  She has been going to pain management at [facility] clinic and gets injections .  She is also on naltrexone which is helpful.  She has been off Cymbalta now and takes gabapentin 300 mg twice daily which is controlling her pain symptoms.  She lives part-time in [place].  She will be moving to [place] next week.  She states she has an appointment with the orthopedic surgeon to remove the mucinous cyst from her right.  She has generalized pain and discomfort from fibromyalgia.  She ambulates with the help of a cane.    Activities of Daily Living:  Patient reports morning stiffness for 30 minutes.   Patient Reports nocturnal pain.  Difficulty dressing/grooming: Denies Difficulty climbing stairs: Reports Difficulty getting out of chair: Reports Difficulty using hands for taps, buttons, cutlery, and/or writing: Denies  Review of Systems  Constitutional:  Positive for fatigue.  HENT:  Negative for mouth sores and mouth dryness.   Eyes:  Negative for dryness.  Respiratory:  Negative for shortness of breath.   Cardiovascular:  Negative for chest pain and palpitations.  Gastrointestinal:  Negative for blood in stool, constipation and diarrhea.  Endocrine: Negative for increased  urination.  Genitourinary:  Positive for nocturia. Negative for involuntary urination.  Musculoskeletal:  Positive for joint pain, gait problem, joint pain, myalgias, muscle weakness, morning stiffness, muscle tenderness and myalgias. Negative for joint swelling.  Skin:  Negative for color change, rash, hair loss and sensitivity to sunlight.  Allergic/Immunologic: Negative for susceptible to infections.  Neurological:  Negative for dizziness and headaches.  Hematological:  Negative for swollen glands.  Psychiatric/Behavioral:  Positive for depressed mood and sleep disturbance. The patient is not nervous/anxious.     PMFS History:  Patient Active Problem List   Diagnosis Date Noted   Lumbar compression fracture (HCC) 06/19/2020   Colles' fracture of left radius, initial encounter for closed fracture 05/09/2020   Osteoarthritis of left hip 11/09/2017   S/P hip replacement, left 11/09/2017   Primary osteoarthritis of both hips 09/25/2016   Fibromyalgia 04/28/2016   Primary osteoarthritis of both knees 04/28/2016   History of total knee replacement, bilateral 04/28/2016   Primary osteoarthritis of both hands 04/28/2016   Primary insomnia 04/28/2016   History of sleep apnea 04/28/2016   History of restless legs syndrome 04/28/2016   Osteopenia of multiple sites 04/28/2016   Trochanteric bursitis of left hip 02/03/2016   Hiatal hernia with gastroesophageal reflux 11/06/2015   Hiatal hernia 03/25/2015   Gastroesophageal reflux disease with esophagitis 01/17/2015   Ligamentous laxity of knee 01/05/2011   Knee effusion, right 11/10/2010   Knee pain 11/10/2010  Incisional hernia 10/16/2010   HYPERTENSION 05/14/2010   OBSTRUCTIVE SLEEP APNEA 05/14/2010   Obesity 04/08/2009    Past Medical History:  Diagnosis Date   Arthritis    Blood transfusion    Compression fracture of L3 vertebra (HCC)    Fibromyalgia    Dr Corliss Skains   GERD (gastroesophageal reflux disease)    Hiatal hernia  with gastroesophageal reflux 11/06/2015   History of hiatal hernia    Hyperlipidemia    Hypertension    Dr Wylene Simmer LOV 4/13 on chart   Seasonal allergies    Sleep apnea    SEVERE  per sleep study 3/12 EPIC no CPAP    Transfusion history    3 yrs ago s/p surgery for knee   Umbilical hernia    Weight decrease    Wrist fracture    Left wrist    Family History  Problem Relation Age of Onset   Lymphoma Mother    Past Surgical History:  Procedure Laterality Date   ANKLE SURGERY     APPENDECTOMY  2014   BALLOON DILATION N/A 06/05/2015   Procedure: Marvis Repress DILATION;  Surgeon: Willis Modena, MD;  [facility];  Service: Endoscopy;  Laterality: N/A;   CATARACT EXTRACTION Bilateral 05/2013   CHOLECYSTECTOMY     DILATION AND CURETTAGE OF UTERUS     ESOPHAGOGASTRODUODENOSCOPY (EGD) WITH PROPOFOL N/A 06/05/2015   Procedure: ESOPHAGOGASTRODUODENOSCOPY (EGD) WITH PROPOFOL;  Surgeon: Willis Modena, MD;  [facility];  Service: Endoscopy;  Laterality: N/A;   HERNIA REPAIR  05/2013   due to appendectomy with mesh   HIATAL HERNIA REPAIR N/A 11/06/2015   Procedure: LAPAROSCOPIC REPAIR HIATAL HERNIA, Nissen FUNDOPLICATION;  Surgeon: Claud Kelp, MD;  Location: [facility];  Service: General;  Laterality: N/A;   JOINT REPLACEMENT     KNEE ARTHROPLASTY Bilateral    knee infection Right    knee hardware removed 11/12/   replaced 1/13   spine injection     [facility]   TOTAL HIP ARTHROPLASTY Left 11/09/2017   Procedure: LEFT TOTAL HIP ARTHROPLASTY;  Surgeon: Valeria Batman, MD;  Location: [facility];  Service: Orthopedics;  Laterality: Left;   VENTRAL HERNIA REPAIR  07/21/2011   Procedure: LAPAROSCOPIC VENTRAL HERNIA;  Surgeon: Ernestene Mention, MD;  Location: [facility];  Service: General;  Laterality: N/A;  Laparoscopic Repair Verntal Incisional Hernia and Mesh   Social History   Social History Narrative   Lives at home with her husband   They have second home in [place]  where they spend a lot of time   Right handed   Caffeine: 2 cups daily   Immunization History  Administered Date(s) Administered   Influenza Whole 11/18/2009   Influenza, High Dose Seasonal PF 01/05/2017, 12/09/2017   Influenza-Unspecified 11/29/2010, 01/18/2015, 11/30/2015   PFIZER(Purple Top)SARS-COV-2 Vaccination 04/22/2019, 05/17/2019, 11/18/2019, 07/25/2020   Pfizer Covid-19 Vaccine Bivalent Booster 68yrs & up 12/09/2020   Pneumococcal Polysaccharide-23 11/29/2007, 04/16/2008   Tdap 04/21/2018   Zoster Recombinant(Shingrix) 10/18/2017     Objective: Vital Signs: BP 132/79 (BP Location: Left Arm, Patient Position: Sitting, Cuff Size: Large)   Pulse 76   Resp 14   Ht 5\' 5"  (1.651 m)   Wt 224 lb 6.4 oz (101.8 kg)   LMP 09/20/2000 (Exact Date)   BMI 37.34 kg/m    Physical Exam Vitals and nursing note reviewed.  Constitutional:      Appearance: She is well-developed.  HENT:     Head: Normocephalic and atraumatic.  Eyes:  Conjunctiva/sclera: Conjunctivae normal.  Cardiovascular:     Rate and Rhythm: Normal rate and regular rhythm.     Heart sounds: Normal heart sounds.  Pulmonary:     Effort: Pulmonary effort is normal.     Breath sounds: Normal breath sounds.  Abdominal:     General: Bowel sounds are normal.     Palpations: Abdomen is soft.  Musculoskeletal:     Cervical back: Normal range of motion.  Lymphadenopathy:     Cervical: No cervical adenopathy.  Skin:    General: Skin is warm and dry.     Capillary Refill: Capillary refill takes less than 2 seconds.  Neurological:     Mental Status: She is alert and oriented to person, place, and time.  Psychiatric:        Behavior: Behavior normal.      Musculoskeletal Exam: She good range of motion of the cervical spine.  She had thoracic kyphosis.  She had painful and limited range of motion of lumbar spine.  Shoulder joints, elbows, wrist joints, MCPs PIPs and DIPs were in good range of motion.  She had  bilateral PIP and DIP thickening.  Mucinous cyst was noted over the right thumb PIP joint.  Hip joints were in good range of motion.  Left hip joint was replaced.  Bilateral knee joints were replaced and had painful range of motion.  No effusion was noted.  There was no tenderness over ankles or MTPs.  CDAI Exam: CDAI Score: -- Patient Global: --; Provider Global: -- Swollen: --; Tender: -- Joint Exam 10/06/2022   No joint exam has been documented for this visit   There is currently no information documented on the homunculus. Go to the Rheumatology activity and complete the homunculus joint exam.  Investigation: No additional findings.  Imaging: No results found.  Recent Labs: Lab Results  Component Value Date   WBC 22.0 (H) 08/22/2020   HGB 17.8 (H) 08/22/2020   PLT 401 (H) 08/22/2020   NA 138 08/22/2020   K 5.1 08/22/2020   CL 107 08/22/2020   CO2 21 (L) 08/22/2020   GLUCOSE 146 (H) 08/22/2020   BUN 24 (H) 08/22/2020   CREATININE 0.86 08/22/2020   BILITOT 1.6 (H) 08/22/2020   ALKPHOS 46 08/22/2020   AST 96 (H) 08/22/2020   ALT 24 08/22/2020   PROT 4.8 (L) 08/22/2020   ALBUMIN 2.4 (L) 08/22/2020   CALCIUM 7.4 (L) 08/22/2020   GFRAA >60 11/11/2017   QFTBGOLDPLUS NEGATIVE 04/09/2020    Speciality Comments: No specialty comments available.  Procedures:  No procedures performed Allergies: Sudafed [pseudoephedrine hcl]   Assessment / Plan:     Visit Diagnoses: Polymyalgia rheumatica (HCC) - She had been off prednisone since December 2022 without any recurrence of PMR symptoms.  No muscular weakness was noted.  Primary osteoarthritis of both hands-she continues to have pain and discomfort in the bilateral hands.  No synovitis was noted.  She had bilateral PIP and DIP thickening.  Joint protection was discussed.  She has a mucinous cyst over right thumb for which she will be having surgery in [place].  Closed Colles' fracture of left radius, sequela - Dr. Ophelia Charter.  Status  post total hip replacement, left-she good range of motion with ongoing discomfort.  History of total knee replacement, bilateral-she complains of pain and discomfort in the bilateral knee joints which are replaced.  She is going to pain management.  She ambulates with the help of a cane.  Need for regular  exercise was emphasized.  She had been to physical therapy in the past and have some handouts on exercises.  DDD (degenerative disc disease), lumbar -she has chronic lower back pain.  She is going to pain management at [facility] clinic where she had L4-L5 facet joint injection x2.  She also sees Dr. Ophelia Charter.  Closed compression fracture of L2 vertebra, sequela - after a fall.  Osteopenia of multiple sites - bone density from April 29, 2020 which showed a T score of -1.2 in the right total hip.  Patient states she had repeat DEXA scan by her PCP which was stable.  I do not have results available.  Calcium rich diet and exercise was emphasized.  Fibromyalgia -she came off Cymbalta due to tremors.  She has been taking gabapentin 300 mg p.o. twice daily.  She is off tramadol.  She is on naltrexone which is prescribed at [facility] pain management clinic.  Primary insomnia-good sleep hygiene was discussed.  History of restless legs syndrome  History of gastroesophageal reflux (GERD) - She is followed by gastroenterology.  History of hypertension-blood pressure 132/79.  History of sleep apnea  Orders: No orders of the defined types were placed in this encounter.  Meds ordered this encounter  Medications   gabapentin (NEURONTIN) 300 MG capsule    Sig: Take 1 capsule (300 mg total) by mouth 2 (two) times daily.    Dispense:  180 capsule    Refill:  0     Follow-Up Instructions: Return in about 6 months (around 04/08/2023).   Pollyann Savoy, MD  Note - This record has been created using Animal nutritionist.  Chart creation errors have been sought, but may not always  have been located.  Such creation errors do not reflect on  the standard of medical care.

## 2022-10-06 ENCOUNTER — Ambulatory Visit: Payer: Medicare PPO | Attending: Rheumatology | Admitting: Rheumatology

## 2022-10-06 ENCOUNTER — Encounter: Payer: Self-pay | Admitting: Rheumatology

## 2022-10-06 VITALS — BP 132/79 | HR 76 | Resp 14 | Ht 65.0 in | Wt 224.4 lb

## 2022-10-06 DIAGNOSIS — Z96653 Presence of artificial knee joint, bilateral: Secondary | ICD-10-CM | POA: Diagnosis not present

## 2022-10-06 DIAGNOSIS — M5136 Other intervertebral disc degeneration, lumbar region: Secondary | ICD-10-CM | POA: Diagnosis not present

## 2022-10-06 DIAGNOSIS — M19041 Primary osteoarthritis, right hand: Secondary | ICD-10-CM | POA: Diagnosis not present

## 2022-10-06 DIAGNOSIS — M8589 Other specified disorders of bone density and structure, multiple sites: Secondary | ICD-10-CM

## 2022-10-06 DIAGNOSIS — M797 Fibromyalgia: Secondary | ICD-10-CM | POA: Diagnosis not present

## 2022-10-06 DIAGNOSIS — Z96642 Presence of left artificial hip joint: Secondary | ICD-10-CM

## 2022-10-06 DIAGNOSIS — M353 Polymyalgia rheumatica: Secondary | ICD-10-CM | POA: Diagnosis not present

## 2022-10-06 DIAGNOSIS — S32020S Wedge compression fracture of second lumbar vertebra, sequela: Secondary | ICD-10-CM

## 2022-10-06 DIAGNOSIS — F5101 Primary insomnia: Secondary | ICD-10-CM

## 2022-10-06 DIAGNOSIS — S52532S Colles' fracture of left radius, sequela: Secondary | ICD-10-CM | POA: Diagnosis not present

## 2022-10-06 DIAGNOSIS — Z8719 Personal history of other diseases of the digestive system: Secondary | ICD-10-CM

## 2022-10-06 DIAGNOSIS — Z8669 Personal history of other diseases of the nervous system and sense organs: Secondary | ICD-10-CM

## 2022-10-06 DIAGNOSIS — M19042 Primary osteoarthritis, left hand: Secondary | ICD-10-CM

## 2022-10-06 DIAGNOSIS — Z8679 Personal history of other diseases of the circulatory system: Secondary | ICD-10-CM

## 2022-10-06 MED ORDER — GABAPENTIN 300 MG PO CAPS: 300.0000 mg | ORAL_CAPSULE | Freq: Two times a day (BID) | ORAL | 0 refills | Status: DC

## 2022-10-09 ENCOUNTER — Telehealth: Payer: Self-pay | Admitting: *Deleted

## 2022-10-09 NOTE — Telephone Encounter (Signed)
Received DEXA results from [facility].  Date of Scan: 05/21/2022  Lowest T-score: -0.9  BMD:0.834  Lowest site measured: Right Total Hip  DX: Osteopenia  Significant changes in BMD and site measured (5% and above):n/a  Current Regimen: Calcium  Recommendation:Calcium, Vitamin D and resistive exercises.   Reviewed by:Sherron Ales, PA-C  Next Appointment:  03/30/2023  Patient advised of recommendations.

## 2022-10-22 DIAGNOSIS — M19042 Primary osteoarthritis, left hand: Secondary | ICD-10-CM | POA: Diagnosis not present

## 2022-10-22 DIAGNOSIS — M797 Fibromyalgia: Secondary | ICD-10-CM | POA: Diagnosis not present

## 2022-10-22 DIAGNOSIS — K219 Gastro-esophageal reflux disease without esophagitis: Secondary | ICD-10-CM | POA: Diagnosis not present

## 2022-10-22 DIAGNOSIS — G8929 Other chronic pain: Secondary | ICD-10-CM | POA: Diagnosis not present

## 2022-10-22 DIAGNOSIS — M25742 Osteophyte, left hand: Secondary | ICD-10-CM | POA: Diagnosis not present

## 2022-10-22 DIAGNOSIS — M67441 Ganglion, right hand: Secondary | ICD-10-CM | POA: Diagnosis not present

## 2022-10-22 DIAGNOSIS — M67442 Ganglion, left hand: Secondary | ICD-10-CM | POA: Diagnosis not present

## 2022-10-22 DIAGNOSIS — K449 Diaphragmatic hernia without obstruction or gangrene: Secondary | ICD-10-CM | POA: Diagnosis not present

## 2022-10-22 DIAGNOSIS — E785 Hyperlipidemia, unspecified: Secondary | ICD-10-CM | POA: Diagnosis not present

## 2022-10-22 DIAGNOSIS — I10 Essential (primary) hypertension: Secondary | ICD-10-CM | POA: Diagnosis not present

## 2022-11-06 DIAGNOSIS — Z9889 Other specified postprocedural states: Secondary | ICD-10-CM | POA: Diagnosis not present

## 2022-11-06 DIAGNOSIS — Z48817 Encounter for surgical aftercare following surgery on the skin and subcutaneous tissue: Secondary | ICD-10-CM | POA: Diagnosis not present

## 2022-11-06 DIAGNOSIS — E7800 Pure hypercholesterolemia, unspecified: Secondary | ICD-10-CM | POA: Diagnosis not present

## 2022-11-06 DIAGNOSIS — Z4801 Encounter for change or removal of surgical wound dressing: Secondary | ICD-10-CM | POA: Diagnosis not present

## 2022-11-06 DIAGNOSIS — Z4802 Encounter for removal of sutures: Secondary | ICD-10-CM | POA: Diagnosis not present

## 2022-11-06 DIAGNOSIS — G4733 Obstructive sleep apnea (adult) (pediatric): Secondary | ICD-10-CM | POA: Diagnosis not present

## 2022-11-06 DIAGNOSIS — M797 Fibromyalgia: Secondary | ICD-10-CM | POA: Diagnosis not present

## 2022-11-06 DIAGNOSIS — Z4789 Encounter for other orthopedic aftercare: Secondary | ICD-10-CM | POA: Diagnosis not present

## 2022-11-06 DIAGNOSIS — K219 Gastro-esophageal reflux disease without esophagitis: Secondary | ICD-10-CM | POA: Diagnosis not present

## 2022-11-11 DIAGNOSIS — H353131 Nonexudative age-related macular degeneration, bilateral, early dry stage: Secondary | ICD-10-CM | POA: Diagnosis not present

## 2022-11-15 DIAGNOSIS — R609 Edema, unspecified: Secondary | ICD-10-CM | POA: Diagnosis not present

## 2022-11-15 DIAGNOSIS — J302 Other seasonal allergic rhinitis: Secondary | ICD-10-CM | POA: Diagnosis not present

## 2022-11-15 DIAGNOSIS — I10 Essential (primary) hypertension: Secondary | ICD-10-CM | POA: Diagnosis not present

## 2022-11-15 DIAGNOSIS — M545 Low back pain, unspecified: Secondary | ICD-10-CM | POA: Diagnosis not present

## 2022-11-15 DIAGNOSIS — E669 Obesity, unspecified: Secondary | ICD-10-CM | POA: Diagnosis not present

## 2022-11-15 DIAGNOSIS — G4733 Obstructive sleep apnea (adult) (pediatric): Secondary | ICD-10-CM | POA: Diagnosis not present

## 2022-11-15 DIAGNOSIS — M1990 Unspecified osteoarthritis, unspecified site: Secondary | ICD-10-CM | POA: Diagnosis not present

## 2022-11-15 DIAGNOSIS — K219 Gastro-esophageal reflux disease without esophagitis: Secondary | ICD-10-CM | POA: Diagnosis not present

## 2022-11-15 DIAGNOSIS — E785 Hyperlipidemia, unspecified: Secondary | ICD-10-CM | POA: Diagnosis not present

## 2022-11-20 DIAGNOSIS — M797 Fibromyalgia: Secondary | ICD-10-CM | POA: Diagnosis not present

## 2022-11-20 DIAGNOSIS — I129 Hypertensive chronic kidney disease with stage 1 through stage 4 chronic kidney disease, or unspecified chronic kidney disease: Secondary | ICD-10-CM | POA: Diagnosis not present

## 2022-11-20 DIAGNOSIS — M353 Polymyalgia rheumatica: Secondary | ICD-10-CM | POA: Diagnosis not present

## 2022-11-20 DIAGNOSIS — R7301 Impaired fasting glucose: Secondary | ICD-10-CM | POA: Diagnosis not present

## 2022-11-20 DIAGNOSIS — Z6836 Body mass index (BMI) 36.0-36.9, adult: Secondary | ICD-10-CM | POA: Diagnosis not present

## 2022-11-20 DIAGNOSIS — N182 Chronic kidney disease, stage 2 (mild): Secondary | ICD-10-CM | POA: Diagnosis not present

## 2022-11-20 DIAGNOSIS — D692 Other nonthrombocytopenic purpura: Secondary | ICD-10-CM | POA: Diagnosis not present

## 2022-11-20 LAB — LAB REPORT - SCANNED
A1c: 5.8
EGFR: 69.4

## 2022-11-20 LAB — COMPREHENSIVE METABOLIC PANEL WITH GFR: EGFR (Non-African Amer.): 69.4

## 2022-11-20 LAB — HEMOGLOBIN A1C: A1c: 5.8

## 2022-11-25 DIAGNOSIS — Z9889 Other specified postprocedural states: Secondary | ICD-10-CM | POA: Diagnosis not present

## 2022-11-25 DIAGNOSIS — M797 Fibromyalgia: Secondary | ICD-10-CM | POA: Diagnosis not present

## 2022-11-25 DIAGNOSIS — G4733 Obstructive sleep apnea (adult) (pediatric): Secondary | ICD-10-CM | POA: Diagnosis not present

## 2022-11-25 DIAGNOSIS — E7800 Pure hypercholesterolemia, unspecified: Secondary | ICD-10-CM | POA: Diagnosis not present

## 2022-11-25 DIAGNOSIS — K219 Gastro-esophageal reflux disease without esophagitis: Secondary | ICD-10-CM | POA: Diagnosis not present

## 2022-11-25 DIAGNOSIS — Z09 Encounter for follow-up examination after completed treatment for conditions other than malignant neoplasm: Secondary | ICD-10-CM | POA: Diagnosis not present

## 2022-11-25 DIAGNOSIS — Z8739 Personal history of other diseases of the musculoskeletal system and connective tissue: Secondary | ICD-10-CM | POA: Diagnosis not present

## 2022-11-25 DIAGNOSIS — M1990 Unspecified osteoarthritis, unspecified site: Secondary | ICD-10-CM | POA: Diagnosis not present

## 2022-11-25 DIAGNOSIS — E669 Obesity, unspecified: Secondary | ICD-10-CM | POA: Diagnosis not present

## 2022-12-16 ENCOUNTER — Other Ambulatory Visit: Payer: Self-pay | Admitting: Rheumatology

## 2022-12-16 NOTE — Telephone Encounter (Signed)
Last Fill: 10/06/2022  Next Visit: 09/27/2023  Last Visit: 10/06/2022  Dx: Fibromyalgia   Current Dose per office note on 10/06/2022: gabapentin 300 mg p.o. twice daily   Okay to refill Gabapentin?

## 2023-01-27 DIAGNOSIS — M545 Low back pain, unspecified: Secondary | ICD-10-CM | POA: Diagnosis not present

## 2023-01-27 DIAGNOSIS — M533 Sacrococcygeal disorders, not elsewhere classified: Secondary | ICD-10-CM | POA: Diagnosis not present

## 2023-01-27 DIAGNOSIS — M47816 Spondylosis without myelopathy or radiculopathy, lumbar region: Secondary | ICD-10-CM | POA: Diagnosis not present

## 2023-01-27 DIAGNOSIS — M47818 Spondylosis without myelopathy or radiculopathy, sacral and sacrococcygeal region: Secondary | ICD-10-CM | POA: Diagnosis not present

## 2023-01-27 DIAGNOSIS — M797 Fibromyalgia: Secondary | ICD-10-CM | POA: Diagnosis not present

## 2023-01-28 DIAGNOSIS — D692 Other nonthrombocytopenic purpura: Secondary | ICD-10-CM | POA: Diagnosis not present

## 2023-01-30 ENCOUNTER — Other Ambulatory Visit: Payer: Self-pay | Admitting: Physician Assistant

## 2023-02-23 DIAGNOSIS — M1990 Unspecified osteoarthritis, unspecified site: Secondary | ICD-10-CM | POA: Diagnosis not present

## 2023-02-23 DIAGNOSIS — Z791 Long term (current) use of non-steroidal anti-inflammatories (NSAID): Secondary | ICD-10-CM | POA: Diagnosis not present

## 2023-02-23 DIAGNOSIS — E669 Obesity, unspecified: Secondary | ICD-10-CM | POA: Diagnosis not present

## 2023-02-23 DIAGNOSIS — K219 Gastro-esophageal reflux disease without esophagitis: Secondary | ICD-10-CM | POA: Diagnosis not present

## 2023-02-23 DIAGNOSIS — M47818 Spondylosis without myelopathy or radiculopathy, sacral and sacrococcygeal region: Secondary | ICD-10-CM | POA: Diagnosis not present

## 2023-02-23 DIAGNOSIS — M533 Sacrococcygeal disorders, not elsewhere classified: Secondary | ICD-10-CM | POA: Diagnosis not present

## 2023-02-23 DIAGNOSIS — I10 Essential (primary) hypertension: Secondary | ICD-10-CM | POA: Diagnosis not present

## 2023-02-23 DIAGNOSIS — Z7982 Long term (current) use of aspirin: Secondary | ICD-10-CM | POA: Diagnosis not present

## 2023-02-23 DIAGNOSIS — E782 Mixed hyperlipidemia: Secondary | ICD-10-CM | POA: Diagnosis not present

## 2023-03-16 NOTE — Progress Notes (Signed)
 Office Visit Note  Patient: Miranda Romero             Date of Birth: [birth year]           MRN: [id number]             PCP: Vernadine Charlie ORN, MD Referring: Vernadine Charlie ORN, MD Visit Date: 03/30/2023 Occupation: @GUAROCC @  Subjective:  Pain in multiple joints and muscles  History of Present Illness: Miranda Romero is a 77 y.o. female with osteoarthritis, degenerative disc disease and fibromyalgia syndrome.  She returns today after her last visit in July 2024.  She states she went to [place]  and coming back from there.  She continues to have some pain and stiffness in the bilateral hands, hips, knees and lower back.  She has been going to pain management.  She continues to be on gabapentin  300 mg twice daily which is helpful.  She is going on a trip to [place].  Patient states she was evaluated by her doctor's and [place]  who recommended physical therapy for her knees and her lower back.  She has a spinal stenosis and continues to have lower back pain.  She is also interested in going to the weight management clinic for weight loss.  She ambulates with the help of a cane.    Activities of Daily Living:  Patient reports morning stiffness for 30 minutes or more.   Patient Reports nocturnal pain.  Difficulty dressing/grooming: Denies Difficulty climbing stairs: Reports Difficulty getting out of chair: Denies Difficulty using hands for taps, buttons, cutlery, and/or writing: Denies  Review of Systems  Constitutional:  Positive for fatigue.  HENT:  Negative for mouth sores and mouth dryness.   Eyes:  Negative for dryness.  Respiratory:  Negative for shortness of breath.   Cardiovascular:  Negative for chest pain and palpitations.  Gastrointestinal:  Negative for blood in stool, constipation and diarrhea.  Endocrine: Positive for increased urination.  Genitourinary:  Negative for involuntary urination.  Musculoskeletal:  Positive for joint pain, gait problem, joint pain,  myalgias, muscle weakness, morning stiffness, muscle tenderness and myalgias. Negative for joint swelling.  Skin:  Negative for color change, rash, hair loss and sensitivity to sunlight.  Allergic/Immunologic: Negative for susceptible to infections.  Neurological:  Negative for dizziness and headaches.  Hematological:  Negative for swollen glands.  Psychiatric/Behavioral:  Positive for depressed mood and sleep disturbance. The patient is not nervous/anxious.     PMFS History:  Patient Active Problem List   Diagnosis Date Noted   Lumbar compression fracture (HCC) 06/19/2020   Colles' fracture of left radius, initial encounter for closed fracture 05/09/2020   Osteoarthritis of left hip 11/09/2017   S/P hip replacement, left 11/09/2017   Primary osteoarthritis of both hips 09/25/2016   Fibromyalgia 04/28/2016   Primary osteoarthritis of both knees 04/28/2016   History of total knee replacement, bilateral 04/28/2016   Primary osteoarthritis of both hands 04/28/2016   Primary insomnia 04/28/2016   History of sleep apnea 04/28/2016   History of restless legs syndrome 04/28/2016   Osteopenia of multiple sites 04/28/2016   Trochanteric bursitis of left hip 02/03/2016   Hiatal hernia with gastroesophageal reflux 11/06/2015   Hiatal hernia 03/25/2015   Gastroesophageal reflux disease with esophagitis 01/17/2015   Ligamentous laxity of knee 01/05/2011   Knee effusion, right 11/10/2010   Knee pain 11/10/2010   Incisional hernia 10/16/2010   Essential hypertension 05/14/2010   Sleep apnea 05/14/2010   Obesity 04/08/2009  Past Medical History:  Diagnosis Date   Arthritis    Blood transfusion    Compression fracture of L3 vertebra (HCC)    Fibromyalgia    Dr Dolphus   GERD (gastroesophageal reflux disease)    Hiatal hernia with gastroesophageal reflux 11/06/2015   History of hiatal hernia    Hyperlipidemia    Hypertension    Dr Vernadine LOV 4/13 on chart   Seasonal allergies     Sleep apnea    SEVERE  per sleep study 3/12 EPIC no CPAP    Transfusion history    3 yrs ago s/p surgery for knee   Umbilical hernia    Weight decrease    Wrist fracture    Left wrist    Family History  Problem Relation Age of Onset   Lymphoma Mother    Past Surgical History:  Procedure Laterality Date   ANKLE SURGERY     APPENDECTOMY  2014   BALLOON DILATION N/A 06/05/2015   Procedure: MERRILL DILATION;  Surgeon: Elsie Cree, MD;  Location: [facility];  Service: Endoscopy;  Laterality: N/A;   CATARACT EXTRACTION Bilateral 05/2013   CHOLECYSTECTOMY     DILATION AND CURETTAGE OF UTERUS     ESOPHAGOGASTRODUODENOSCOPY (EGD) WITH PROPOFOL  N/A 06/05/2015   Procedure: ESOPHAGOGASTRODUODENOSCOPY (EGD) WITH PROPOFOL ;  Surgeon: Elsie Cree, MD;  [facility];  Service: Endoscopy;  Laterality: N/A;   HERNIA REPAIR  05/2013   due to appendectomy with mesh   HIATAL HERNIA REPAIR N/A 11/06/2015   Procedure: LAPAROSCOPIC REPAIR HIATAL HERNIA, Nissen FUNDOPLICATION;  Surgeon: Elon Pacini, MD;  Location: [facility];  Service: General;  Laterality: N/A;   JOINT REPLACEMENT     KNEE ARTHROPLASTY Bilateral    knee infection Right    knee hardware removed 11/12/   replaced 1/13   spine injection     [facility]   thumb surgery Right    TOTAL HIP ARTHROPLASTY Left 11/09/2017   Procedure: LEFT TOTAL HIP ARTHROPLASTY;  Surgeon: Anderson Maude ORN, MD;  Location: [facility];  Service: Orthopedics;  Laterality: Left;   VENTRAL HERNIA REPAIR  07/21/2011   Procedure: LAPAROSCOPIC VENTRAL HERNIA;  Surgeon: Elon CHRISTELLA Pacini, MD;  Location: [facility];  Service: General;  Laterality: N/A;  Laparoscopic Repair Verntal Incisional Hernia and Mesh   Social History   Social History Narrative   Lives at home with her husband   They have second home in [place]  where they spend a lot of time   Right handed   Caffeine: 2 cups daily   Immunization History  Administered Date(s) Administered    Influenza Whole 11/18/2009   Influenza, High Dose Seasonal PF 01/05/2017, 12/09/2017   Influenza-Unspecified 11/29/2010, 01/18/2015, 11/30/2015   PFIZER(Purple Top)SARS-COV-2 Vaccination 04/22/2019, 05/17/2019, 11/18/2019, 07/25/2020   Pfizer Covid-19 Vaccine Bivalent Booster 43yrs & up 12/09/2020   Pneumococcal Polysaccharide-23 11/29/2007, 04/16/2008   Tdap 04/21/2018   Zoster Recombinant(Shingrix) 10/18/2017     Objective: Vital Signs: BP 119/82 (BP Location: Left Arm, Patient Position: Sitting, Cuff Size: Normal)   Pulse 89   Resp 14   Ht 5' 5 (1.651 m)   Wt 219 lb (99.3 kg)   LMP 09/20/2000 (Exact Date)   BMI 36.44 kg/m    Physical Exam Vitals and nursing note reviewed.  Constitutional:      Appearance: She is well-developed.  HENT:     Head: Normocephalic and atraumatic.  Eyes:     Conjunctiva/sclera: Conjunctivae normal.  Cardiovascular:     Rate and Rhythm:  Normal rate and regular rhythm.     Heart sounds: Normal heart sounds.  Pulmonary:     Effort: Pulmonary effort is normal.     Breath sounds: Normal breath sounds.  Abdominal:     General: Bowel sounds are normal.     Palpations: Abdomen is soft.  Musculoskeletal:     Cervical back: Normal range of motion.  Lymphadenopathy:     Cervical: No cervical adenopathy.  Skin:    General: Skin is warm and dry.     Capillary Refill: Capillary refill takes less than 2 seconds.  Neurological:     Mental Status: She is alert and oriented to person, place, and time.  Psychiatric:        Behavior: Behavior normal.      Musculoskeletal Exam: She had good range of motion of the cervical spine.  Thoracic kyphosis was noted.  Lumbar spine range of motion was difficult to assess.  Shoulder joints, elbow joints, wrist joints, MCPs PIPs and DIPs were in good range of motion with no synovitis.  Hip joints were in good range of motion.  Left hip joint was replaced.  Knee joints were replaced with some discomfort on range of  motion.  No warmth swelling or effusion was noted.  There was no tenderness over ankles or MTPs.  CDAI Exam: CDAI Score: -- Patient Global: --; Provider Global: -- Swollen: --; Tender: -- Joint Exam 03/30/2023   No joint exam has been documented for this visit   There is currently no information documented on the homunculus. Go to the Rheumatology activity and complete the homunculus joint exam.  Investigation: No additional findings.  Imaging: No results found.  Recent Labs: Lab Results  Component Value Date   WBC 22.0 (H) 08/22/2020   HGB 17.8 (H) 08/22/2020   PLT 401 (H) 08/22/2020   NA 138 08/22/2020   K 5.1 08/22/2020   CL 107 08/22/2020   CO2 21 (L) 08/22/2020   GLUCOSE 146 (H) 08/22/2020   BUN 24 (H) 08/22/2020   CREATININE 0.86 08/22/2020   BILITOT 1.6 (H) 08/22/2020   ALKPHOS 46 08/22/2020   AST 96 (H) 08/22/2020   ALT 24 08/22/2020   PROT 4.8 (L) 08/22/2020   ALBUMIN 2.4 (L) 08/22/2020   CALCIUM  7.4 (L) 08/22/2020   GFRAA >60 11/11/2017   QFTBGOLDPLUS NEGATIVE 04/09/2020     Speciality Comments: No specialty comments available.  Procedures:  No procedures performed Allergies: Sudafed [pseudoephedrine hcl]   Assessment / Plan:     Visit Diagnoses: Polymyalgia rheumatica (HCC) -she denies any increased muscular weakness or tenderness.  She had some difficulty getting up from the chair due to knee joint discomfort.  She ambulates with the help of a cane.  She has been off prednisone  since December 2022.  Primary osteoarthritis of both hands - Right thumb mucinous cyst was removed in [place] .  Patient did well.  She had no swelling on the examination today.  Closed Colles' fracture of left radius, sequela - Treated by Dr. Barbarann  Status post total hip replacement, left-she had good range of motion although she continues to have some discomfort.  History of total knee replacement, bilateral -she continues to have chronic pain.  Patient ambulates with the  help of a cane.  She had been going to pain management in [place] .  She would like to go for physical therapy.  She was advised to get lower extremity muscle strengthening.  A referral for physical therapy was placed.  Degeneration  of intervertebral disc of lumbar region without discogenic back pain or lower extremity pain -she has chronic lower back pain and discomfort.  She gets facet joint injections at [facility] and  followed by Dr. Barbarann.  She continues to have lower back pain.  I will refer her to physical therapy.  Patient states she was diagnosed  Closed compression fracture of L2 vertebra, sequela - After a fall.  Osteopenia of multiple sites - April 29, 2020 T-score -1.2 right total hip.May 21, 2022 T-score -0.9 right total hip at [facility].  Calcium  rich diet and exercise was emphasized.  Fibromyalgia -she continues to have generalized pain and discomfort.  She had tighter points and hyperalgesia on the examination today.  She is on gabapentin  300 mg p.o. twice daily which she is tolerating without any side effects.  Cymbalta  discontinued due to tremors.  She has been off tramadol .  Now on naltrexone per [facility] clinic  Primary insomnia-good sleep hygiene was discussed.  History of restless legs syndrome  History of hypertension  History of gastroesophageal reflux (GERD)  History of sleep apnea  BMI 36.0-36.9,adult-I will refer her to pain management per patient's request.  Orders: No orders of the defined types were placed in this encounter.  No orders of the defined types were placed in this encounter.    Follow-Up Instructions: Return in about 6 months (around 09/27/2023) for PMR, OA.   Maya Nash, MD  Note - This record has been created using Animal nutritionist.  Chart creation errors have been sought, but may not always  have been located. Such creation errors do not reflect on  the standard of medical care.

## 2023-03-18 DIAGNOSIS — M533 Sacrococcygeal disorders, not elsewhere classified: Secondary | ICD-10-CM | POA: Diagnosis not present

## 2023-03-18 DIAGNOSIS — M47816 Spondylosis without myelopathy or radiculopathy, lumbar region: Secondary | ICD-10-CM | POA: Diagnosis not present

## 2023-03-18 DIAGNOSIS — M48061 Spinal stenosis, lumbar region without neurogenic claudication: Secondary | ICD-10-CM | POA: Diagnosis not present

## 2023-03-18 DIAGNOSIS — M47818 Spondylosis without myelopathy or radiculopathy, sacral and sacrococcygeal region: Secondary | ICD-10-CM | POA: Diagnosis not present

## 2023-03-18 DIAGNOSIS — M791 Myalgia, unspecified site: Secondary | ICD-10-CM | POA: Diagnosis not present

## 2023-03-30 ENCOUNTER — Encounter: Payer: Self-pay | Admitting: Rheumatology

## 2023-03-30 ENCOUNTER — Ambulatory Visit: Payer: Medicare PPO | Attending: Rheumatology | Admitting: Rheumatology

## 2023-03-30 VITALS — BP 119/82 | HR 89 | Resp 14 | Ht 65.0 in | Wt 219.0 lb

## 2023-03-30 DIAGNOSIS — Z96642 Presence of left artificial hip joint: Secondary | ICD-10-CM | POA: Diagnosis not present

## 2023-03-30 DIAGNOSIS — M19041 Primary osteoarthritis, right hand: Secondary | ICD-10-CM

## 2023-03-30 DIAGNOSIS — Z8669 Personal history of other diseases of the nervous system and sense organs: Secondary | ICD-10-CM

## 2023-03-30 DIAGNOSIS — M19042 Primary osteoarthritis, left hand: Secondary | ICD-10-CM

## 2023-03-30 DIAGNOSIS — M353 Polymyalgia rheumatica: Secondary | ICD-10-CM

## 2023-03-30 DIAGNOSIS — M8589 Other specified disorders of bone density and structure, multiple sites: Secondary | ICD-10-CM

## 2023-03-30 DIAGNOSIS — S32020S Wedge compression fracture of second lumbar vertebra, sequela: Secondary | ICD-10-CM

## 2023-03-30 DIAGNOSIS — S52532S Colles' fracture of left radius, sequela: Secondary | ICD-10-CM

## 2023-03-30 DIAGNOSIS — M797 Fibromyalgia: Secondary | ICD-10-CM | POA: Diagnosis not present

## 2023-03-30 DIAGNOSIS — F5101 Primary insomnia: Secondary | ICD-10-CM

## 2023-03-30 DIAGNOSIS — M51369 Other intervertebral disc degeneration, lumbar region without mention of lumbar back pain or lower extremity pain: Secondary | ICD-10-CM

## 2023-03-30 DIAGNOSIS — Z8679 Personal history of other diseases of the circulatory system: Secondary | ICD-10-CM

## 2023-03-30 DIAGNOSIS — Z96653 Presence of artificial knee joint, bilateral: Secondary | ICD-10-CM

## 2023-03-30 DIAGNOSIS — Z6836 Body mass index (BMI) 36.0-36.9, adult: Secondary | ICD-10-CM

## 2023-03-30 DIAGNOSIS — Z8719 Personal history of other diseases of the digestive system: Secondary | ICD-10-CM

## 2023-04-01 ENCOUNTER — Telehealth: Payer: Self-pay | Admitting: *Deleted

## 2023-04-01 NOTE — Telephone Encounter (Signed)
Labs received from:[facility]  Drawn on: 11/20/2022  Reviewed by: Sherron Ales, PA-C  Labs drawn: CMP, Hgb A1C  Results: Glucose 150, all other labs WNL   Patient is on Gabapentin 300 mg po BID and Voltaren Gel 2-4 grams PRN.

## 2023-05-11 ENCOUNTER — Telehealth: Payer: Self-pay | Admitting: Rheumatology

## 2023-05-11 DIAGNOSIS — M6281 Muscle weakness (generalized): Secondary | ICD-10-CM | POA: Diagnosis not present

## 2023-05-11 DIAGNOSIS — M25561 Pain in right knee: Secondary | ICD-10-CM | POA: Diagnosis not present

## 2023-05-11 DIAGNOSIS — R2689 Other abnormalities of gait and mobility: Secondary | ICD-10-CM | POA: Diagnosis not present

## 2023-05-11 DIAGNOSIS — M25551 Pain in right hip: Secondary | ICD-10-CM | POA: Diagnosis not present

## 2023-05-11 DIAGNOSIS — M542 Cervicalgia: Secondary | ICD-10-CM | POA: Diagnosis not present

## 2023-05-11 DIAGNOSIS — M25552 Pain in left hip: Secondary | ICD-10-CM | POA: Diagnosis not present

## 2023-05-11 DIAGNOSIS — M5459 Other low back pain: Secondary | ICD-10-CM | POA: Diagnosis not present

## 2023-05-11 DIAGNOSIS — M25562 Pain in left knee: Secondary | ICD-10-CM | POA: Diagnosis not present

## 2023-05-11 NOTE — Telephone Encounter (Signed)
 Referral and note refaxed.

## 2023-05-11 NOTE — Telephone Encounter (Signed)
 Trudy from [facility] called requesting [place] to resend over the referral from last time 04/05/23. Damian Leavell states a new employee scanned it wrong and would need it for their records. Fax number is [phone]

## 2023-05-17 DIAGNOSIS — M25561 Pain in right knee: Secondary | ICD-10-CM | POA: Diagnosis not present

## 2023-05-17 DIAGNOSIS — M25552 Pain in left hip: Secondary | ICD-10-CM | POA: Diagnosis not present

## 2023-05-17 DIAGNOSIS — R2689 Other abnormalities of gait and mobility: Secondary | ICD-10-CM | POA: Diagnosis not present

## 2023-05-17 DIAGNOSIS — M542 Cervicalgia: Secondary | ICD-10-CM | POA: Diagnosis not present

## 2023-05-17 DIAGNOSIS — M5459 Other low back pain: Secondary | ICD-10-CM | POA: Diagnosis not present

## 2023-05-17 DIAGNOSIS — M25562 Pain in left knee: Secondary | ICD-10-CM | POA: Diagnosis not present

## 2023-05-17 DIAGNOSIS — M25551 Pain in right hip: Secondary | ICD-10-CM | POA: Diagnosis not present

## 2023-05-17 DIAGNOSIS — M6281 Muscle weakness (generalized): Secondary | ICD-10-CM | POA: Diagnosis not present

## 2023-05-18 DIAGNOSIS — Z1231 Encounter for screening mammogram for malignant neoplasm of breast: Secondary | ICD-10-CM | POA: Diagnosis not present

## 2023-05-24 DIAGNOSIS — E7800 Pure hypercholesterolemia, unspecified: Secondary | ICD-10-CM | POA: Diagnosis not present

## 2023-05-24 DIAGNOSIS — D692 Other nonthrombocytopenic purpura: Secondary | ICD-10-CM | POA: Diagnosis not present

## 2023-05-24 DIAGNOSIS — R7301 Impaired fasting glucose: Secondary | ICD-10-CM | POA: Diagnosis not present

## 2023-05-24 DIAGNOSIS — Z1212 Encounter for screening for malignant neoplasm of rectum: Secondary | ICD-10-CM | POA: Diagnosis not present

## 2023-05-24 DIAGNOSIS — I129 Hypertensive chronic kidney disease with stage 1 through stage 4 chronic kidney disease, or unspecified chronic kidney disease: Secondary | ICD-10-CM | POA: Diagnosis not present

## 2023-05-24 DIAGNOSIS — N182 Chronic kidney disease, stage 2 (mild): Secondary | ICD-10-CM | POA: Diagnosis not present

## 2023-05-24 DIAGNOSIS — M810 Age-related osteoporosis without current pathological fracture: Secondary | ICD-10-CM | POA: Diagnosis not present

## 2023-05-24 LAB — HEMOGLOBIN A1C: A1c: 5.4

## 2023-05-24 LAB — COMPREHENSIVE METABOLIC PANEL WITH GFR: EGFR (Non-African Amer.): 96.7

## 2023-05-25 DIAGNOSIS — L814 Other melanin hyperpigmentation: Secondary | ICD-10-CM | POA: Diagnosis not present

## 2023-05-25 DIAGNOSIS — L720 Epidermal cyst: Secondary | ICD-10-CM | POA: Diagnosis not present

## 2023-05-25 DIAGNOSIS — D1801 Hemangioma of skin and subcutaneous tissue: Secondary | ICD-10-CM | POA: Diagnosis not present

## 2023-05-25 DIAGNOSIS — L853 Xerosis cutis: Secondary | ICD-10-CM | POA: Diagnosis not present

## 2023-05-25 DIAGNOSIS — Z85828 Personal history of other malignant neoplasm of skin: Secondary | ICD-10-CM | POA: Diagnosis not present

## 2023-05-25 DIAGNOSIS — L821 Other seborrheic keratosis: Secondary | ICD-10-CM | POA: Diagnosis not present

## 2023-05-26 DIAGNOSIS — M25551 Pain in right hip: Secondary | ICD-10-CM | POA: Diagnosis not present

## 2023-05-26 DIAGNOSIS — M6281 Muscle weakness (generalized): Secondary | ICD-10-CM | POA: Diagnosis not present

## 2023-05-26 DIAGNOSIS — R2689 Other abnormalities of gait and mobility: Secondary | ICD-10-CM | POA: Diagnosis not present

## 2023-05-26 DIAGNOSIS — M25552 Pain in left hip: Secondary | ICD-10-CM | POA: Diagnosis not present

## 2023-05-26 DIAGNOSIS — M25561 Pain in right knee: Secondary | ICD-10-CM | POA: Diagnosis not present

## 2023-05-26 DIAGNOSIS — M542 Cervicalgia: Secondary | ICD-10-CM | POA: Diagnosis not present

## 2023-05-26 DIAGNOSIS — M5459 Other low back pain: Secondary | ICD-10-CM | POA: Diagnosis not present

## 2023-05-26 DIAGNOSIS — M25562 Pain in left knee: Secondary | ICD-10-CM | POA: Diagnosis not present

## 2023-05-27 DIAGNOSIS — M25562 Pain in left knee: Secondary | ICD-10-CM | POA: Diagnosis not present

## 2023-05-27 DIAGNOSIS — M25561 Pain in right knee: Secondary | ICD-10-CM | POA: Diagnosis not present

## 2023-05-27 DIAGNOSIS — M5459 Other low back pain: Secondary | ICD-10-CM | POA: Diagnosis not present

## 2023-05-27 DIAGNOSIS — M6281 Muscle weakness (generalized): Secondary | ICD-10-CM | POA: Diagnosis not present

## 2023-05-27 DIAGNOSIS — M25551 Pain in right hip: Secondary | ICD-10-CM | POA: Diagnosis not present

## 2023-05-27 DIAGNOSIS — R2689 Other abnormalities of gait and mobility: Secondary | ICD-10-CM | POA: Diagnosis not present

## 2023-05-27 DIAGNOSIS — M25552 Pain in left hip: Secondary | ICD-10-CM | POA: Diagnosis not present

## 2023-05-27 DIAGNOSIS — M542 Cervicalgia: Secondary | ICD-10-CM | POA: Diagnosis not present

## 2023-05-31 DIAGNOSIS — N182 Chronic kidney disease, stage 2 (mild): Secondary | ICD-10-CM | POA: Diagnosis not present

## 2023-05-31 DIAGNOSIS — I129 Hypertensive chronic kidney disease with stage 1 through stage 4 chronic kidney disease, or unspecified chronic kidney disease: Secondary | ICD-10-CM | POA: Diagnosis not present

## 2023-05-31 DIAGNOSIS — Z96642 Presence of left artificial hip joint: Secondary | ICD-10-CM | POA: Diagnosis not present

## 2023-05-31 DIAGNOSIS — Z1339 Encounter for screening examination for other mental health and behavioral disorders: Secondary | ICD-10-CM | POA: Diagnosis not present

## 2023-05-31 DIAGNOSIS — M797 Fibromyalgia: Secondary | ICD-10-CM | POA: Diagnosis not present

## 2023-05-31 DIAGNOSIS — K219 Gastro-esophageal reflux disease without esophagitis: Secondary | ICD-10-CM | POA: Diagnosis not present

## 2023-05-31 DIAGNOSIS — M353 Polymyalgia rheumatica: Secondary | ICD-10-CM | POA: Diagnosis not present

## 2023-05-31 DIAGNOSIS — R7301 Impaired fasting glucose: Secondary | ICD-10-CM | POA: Diagnosis not present

## 2023-05-31 DIAGNOSIS — M67449 Ganglion, unspecified hand: Secondary | ICD-10-CM | POA: Diagnosis not present

## 2023-05-31 DIAGNOSIS — R82998 Other abnormal findings in urine: Secondary | ICD-10-CM | POA: Diagnosis not present

## 2023-05-31 DIAGNOSIS — Z0000 Encounter for general adult medical examination without abnormal findings: Secondary | ICD-10-CM | POA: Diagnosis not present

## 2023-05-31 DIAGNOSIS — Z1331 Encounter for screening for depression: Secondary | ICD-10-CM | POA: Diagnosis not present

## 2023-06-01 DIAGNOSIS — M25562 Pain in left knee: Secondary | ICD-10-CM | POA: Diagnosis not present

## 2023-06-01 DIAGNOSIS — R2689 Other abnormalities of gait and mobility: Secondary | ICD-10-CM | POA: Diagnosis not present

## 2023-06-01 DIAGNOSIS — M25551 Pain in right hip: Secondary | ICD-10-CM | POA: Diagnosis not present

## 2023-06-01 DIAGNOSIS — M542 Cervicalgia: Secondary | ICD-10-CM | POA: Diagnosis not present

## 2023-06-01 DIAGNOSIS — M5459 Other low back pain: Secondary | ICD-10-CM | POA: Diagnosis not present

## 2023-06-01 DIAGNOSIS — M6281 Muscle weakness (generalized): Secondary | ICD-10-CM | POA: Diagnosis not present

## 2023-06-01 DIAGNOSIS — M25552 Pain in left hip: Secondary | ICD-10-CM | POA: Diagnosis not present

## 2023-06-01 DIAGNOSIS — M25561 Pain in right knee: Secondary | ICD-10-CM | POA: Diagnosis not present

## 2023-07-15 ENCOUNTER — Other Ambulatory Visit: Payer: Self-pay | Admitting: Physician Assistant

## 2023-07-15 DIAGNOSIS — M67439 Ganglion, unspecified wrist: Secondary | ICD-10-CM

## 2023-07-15 HISTORY — DX: Ganglion, unspecified wrist: M67.439

## 2023-07-15 NOTE — Telephone Encounter (Signed)
 Last Fill: 12/16/2022  Next Visit: 09/21/2023  Last Visit: 03/30/2023  Dx: Fibromyalgia    Current Dose per office note on 03/30/2023: gabapentin  300 mg p.o. twice daily   Okay to refill Gabapentin ?

## 2023-08-13 DIAGNOSIS — E7800 Pure hypercholesterolemia, unspecified: Secondary | ICD-10-CM | POA: Diagnosis not present

## 2023-08-13 DIAGNOSIS — M1811 Unilateral primary osteoarthritis of first carpometacarpal joint, right hand: Secondary | ICD-10-CM | POA: Diagnosis not present

## 2023-08-13 DIAGNOSIS — K219 Gastro-esophageal reflux disease without esophagitis: Secondary | ICD-10-CM | POA: Diagnosis not present

## 2023-08-13 DIAGNOSIS — M797 Fibromyalgia: Secondary | ICD-10-CM | POA: Diagnosis not present

## 2023-08-13 DIAGNOSIS — Z6837 Body mass index (BMI) 37.0-37.9, adult: Secondary | ICD-10-CM | POA: Diagnosis not present

## 2023-08-13 DIAGNOSIS — M67431 Ganglion, right wrist: Secondary | ICD-10-CM | POA: Diagnosis not present

## 2023-08-13 DIAGNOSIS — M19031 Primary osteoarthritis, right wrist: Secondary | ICD-10-CM | POA: Diagnosis not present

## 2023-08-13 DIAGNOSIS — G4733 Obstructive sleep apnea (adult) (pediatric): Secondary | ICD-10-CM | POA: Diagnosis not present

## 2023-08-13 DIAGNOSIS — E669 Obesity, unspecified: Secondary | ICD-10-CM | POA: Diagnosis not present

## 2023-08-23 DIAGNOSIS — M47818 Spondylosis without myelopathy or radiculopathy, sacral and sacrococcygeal region: Secondary | ICD-10-CM | POA: Diagnosis not present

## 2023-08-23 DIAGNOSIS — M47816 Spondylosis without myelopathy or radiculopathy, lumbar region: Secondary | ICD-10-CM | POA: Diagnosis not present

## 2023-08-23 DIAGNOSIS — M48061 Spinal stenosis, lumbar region without neurogenic claudication: Secondary | ICD-10-CM | POA: Diagnosis not present

## 2023-08-23 DIAGNOSIS — M791 Myalgia, unspecified site: Secondary | ICD-10-CM | POA: Diagnosis not present

## 2023-09-07 DIAGNOSIS — M48062 Spinal stenosis, lumbar region with neurogenic claudication: Secondary | ICD-10-CM | POA: Diagnosis not present

## 2023-09-07 DIAGNOSIS — M47817 Spondylosis without myelopathy or radiculopathy, lumbosacral region: Secondary | ICD-10-CM | POA: Diagnosis not present

## 2023-09-07 DIAGNOSIS — M48061 Spinal stenosis, lumbar region without neurogenic claudication: Secondary | ICD-10-CM | POA: Diagnosis not present

## 2023-09-07 DIAGNOSIS — M47816 Spondylosis without myelopathy or radiculopathy, lumbar region: Secondary | ICD-10-CM | POA: Diagnosis not present

## 2023-09-07 NOTE — Progress Notes (Signed)
 Office Visit Note  Patient: Miranda Romero             Date of Birth: [birth year]           MRN: [id number]             PCP: Tisovec, Richard W, MD Referring: Vernadine Charlie ORN, MD Visit Date: 09/21/2023 Occupation: @GUAROCC @  Subjective:  Generalized pain  History of Present Illness: Miranda  AUDRIS Romero is a 78 y.o. female with osteoarthritis, degenerative disc disease and fibromyalgia syndrome.  She returns today after last visit in January 2025.  She denies any increased muscular weakness or tenderness.  She continues to have pain and discomfort in all of her joints and generalized fatigue.  She just returned back from her cruise to [place] and [place] and did quite well on the cruise.  She states she has noticed improvement in her overall pain.  She states the pain level on the scale of 0-10 is about 4-5.  She has been taking gabapentin  300 mg twice daily and Tylenol  1 g twice daily to manage her pain symptoms.  She states her insomnia has improved since she has been on Gemtesa by her urologist for nocturnal enuresis.    Activities of Daily Living:  Patient reports morning stiffness for 30 minutes.   Patient Reports nocturnal pain.  Difficulty dressing/grooming: Denies Difficulty climbing stairs: Denies Difficulty getting out of chair: Reports Difficulty using hands for taps, buttons, cutlery, and/or writing: Reports  Review of Systems  Constitutional:  Positive for fatigue.  HENT:  Negative for mouth sores and mouth dryness.   Eyes:  Negative for dryness.  Respiratory:  Negative for shortness of breath.   Cardiovascular:  Negative for chest pain and palpitations.  Gastrointestinal:  Negative for blood in stool, constipation and diarrhea.  Endocrine: Negative for increased urination.  Genitourinary:  Negative for involuntary urination.  Musculoskeletal:  Positive for joint pain, gait problem, joint pain, myalgias, muscle weakness, morning stiffness and myalgias.  Negative for joint swelling and muscle tenderness.  Skin:  Negative for color change, rash, hair loss and sensitivity to sunlight.  Allergic/Immunologic: Negative for susceptible to infections.  Neurological:  Negative for dizziness and headaches.  Hematological:  Negative for swollen glands.  Psychiatric/Behavioral:  Negative for depressed mood and sleep disturbance. The patient is not nervous/anxious.     PMFS History:  Patient Active Problem List   Diagnosis Date Noted   Lumbar compression fracture (HCC) 06/19/2020   Colles' fracture of left radius, initial encounter for closed fracture 05/09/2020   Osteoarthritis of left hip 11/09/2017   S/P hip replacement, left 11/09/2017   Primary osteoarthritis of both hips 09/25/2016   Fibromyalgia 04/28/2016   Primary osteoarthritis of both knees 04/28/2016   History of total knee replacement, bilateral 04/28/2016   Primary osteoarthritis of both hands 04/28/2016   Primary insomnia 04/28/2016   History of sleep apnea 04/28/2016   History of restless legs syndrome 04/28/2016   Osteopenia of multiple sites 04/28/2016   Trochanteric bursitis of left hip 02/03/2016   Hiatal hernia with gastroesophageal reflux 11/06/2015   Hiatal hernia 03/25/2015   Gastroesophageal reflux disease with esophagitis 01/17/2015   Ligamentous laxity of knee 01/05/2011   Knee effusion, right 11/10/2010   Knee pain 11/10/2010   Incisional hernia 10/16/2010   Essential hypertension 05/14/2010   Sleep apnea 05/14/2010   Obesity 04/08/2009    Past Medical History:  Diagnosis Date   Arthritis    Blood transfusion  Compression fracture of L3 vertebra (HCC)    Fibromyalgia    Dr Dolphus   Ganglion cyst of wrist 07/2023   Right wrist   GERD (gastroesophageal reflux disease)    Hiatal hernia with gastroesophageal reflux 11/06/2015   History of hiatal hernia    Hyperlipidemia    Hypertension    Dr Vernadine LOV 4/13 on chart   Seasonal allergies    Sleep  apnea    SEVERE  per sleep study 3/12 EPIC no CPAP    Transfusion history    3 yrs ago s/p surgery for knee   Umbilical hernia    Weight decrease    Wrist fracture    Left wrist    Family History  Problem Relation Age of Onset   Lymphoma Mother    Past Surgical History:  Procedure Laterality Date   ANKLE SURGERY     APPENDECTOMY  2014   BALLOON DILATION N/A 06/05/2015   Procedure: MERRILL DILATION;  Surgeon: Elsie Cree, MD;  [facility];  Service: Endoscopy;  Laterality: N/A;   CATARACT EXTRACTION Bilateral 05/2013   CHOLECYSTECTOMY     DILATION AND CURETTAGE OF UTERUS     ESOPHAGOGASTRODUODENOSCOPY (EGD) WITH PROPOFOL  N/A 06/05/2015   Procedure: ESOPHAGOGASTRODUODENOSCOPY (EGD) WITH PROPOFOL ;  Surgeon: Elsie Cree, MD;  [facility];  Service: Endoscopy;  Laterality: N/A;   HERNIA REPAIR  05/2013   due to appendectomy with mesh   HIATAL HERNIA REPAIR N/A 11/06/2015   Procedure: LAPAROSCOPIC REPAIR HIATAL HERNIA, Nissen FUNDOPLICATION;  Surgeon: Elon Pacini, MD;  [facility];  Service: General;  Laterality: N/A;   JOINT REPLACEMENT     KNEE ARTHROPLASTY Bilateral    knee infection Right    knee hardware removed 11/12/   replaced 1/13   spine injection     [facility]   thumb surgery Right    TOTAL HIP ARTHROPLASTY Left 11/09/2017   Procedure: LEFT TOTAL HIP ARTHROPLASTY;  Surgeon: Anderson Maude ORN, MD;  Location: [facility];  Service: Orthopedics;  Laterality: Left;   VENTRAL HERNIA REPAIR  07/21/2011   Procedure: LAPAROSCOPIC VENTRAL HERNIA;  Surgeon: Elon CHRISTELLA Pacini, MD;  Location: [facility];  Service: General;  Laterality: N/A;  Laparoscopic Repair Verntal Incisional Hernia and Mesh   Social History   Social History Narrative   Lives at home with her husband   They have second home in [place]  where they spend a lot of time   Right handed   Caffeine: 2 cups daily   Immunization History  Administered Date(s) Administered   Influenza  Whole 11/18/2009   Influenza, High Dose Seasonal PF 01/05/2017, 12/09/2017   Influenza-Unspecified 11/29/2010, 01/18/2015, 11/30/2015   PFIZER(Purple Top)SARS-COV-2 Vaccination 04/22/2019, 05/17/2019, 11/18/2019, 07/25/2020   Pfizer Covid-19 Vaccine Bivalent Booster 34yrs & up 12/09/2020   Pneumococcal Polysaccharide-23 11/29/2007, 04/16/2008   Tdap 04/21/2018   Zoster Recombinant(Shingrix) 10/18/2017     Objective: Vital Signs: BP 105/72 (BP Location: Left Arm, Patient Position: Sitting, Cuff Size: Normal)   Pulse 99   Resp 16   Ht 5' 5 (1.651 m)   Wt 218 lb 3.2 oz (99 kg)   LMP 09/20/2000 (Exact Date)   BMI 36.31 kg/m    Physical Exam Vitals and nursing note reviewed.  Constitutional:      Appearance: She is well-developed.  HENT:     Head: Normocephalic and atraumatic.  Eyes:     Conjunctiva/sclera: Conjunctivae normal.  Cardiovascular:     Rate and Rhythm: Normal rate and regular  rhythm.     Heart sounds: Normal heart sounds.  Pulmonary:     Effort: Pulmonary effort is normal.     Breath sounds: Normal breath sounds.  Abdominal:     General: Bowel sounds are normal.     Palpations: Abdomen is soft.  Musculoskeletal:     Cervical back: Normal range of motion.  Lymphadenopathy:     Cervical: No cervical adenopathy.  Skin:    General: Skin is warm and dry.     Capillary Refill: Capillary refill takes less than 2 seconds.  Neurological:     Mental Status: She is alert and oriented to person, place, and time.  Psychiatric:        Behavior: Behavior normal.      Musculoskeletal Exam: She has good range of motion of the cervical spine without discomfort.  Thoracic kyphosis was noted.  She had some discomfort in the lower lumbar region.  Shoulders, elbows, wrist, MCPs PIPs and DIPs with good range of motion.  She had a ganglion cyst on the volar aspect of her right wrist.  Hips and knee joints in good range of motion.  Left hip joint was replaced.  She has some  limitation of extension of her knee joints.  Both knee joints were replaced.  There was no tenderness over her ankles or MTPs.  CDAI Exam: CDAI Score: -- Patient Global: --; Provider Global: -- Swollen: --; Tender: -- Joint Exam 09/21/2023   No joint exam has been documented for this visit   There is currently no information documented on the homunculus. Go to the Rheumatology activity and complete the homunculus joint exam.  Investigation: No additional findings.  Imaging: No results found.  Recent Labs: Lab Results  Component Value Date   WBC 22.0 (H) 08/22/2020   HGB 17.8 (H) 08/22/2020   PLT 401 (H) 08/22/2020   NA 138 08/22/2020   K 5.1 08/22/2020   CL 107 08/22/2020   CO2 21 (L) 08/22/2020   GLUCOSE 146 (H) 08/22/2020   BUN 24 (H) 08/22/2020   CREATININE 0.86 08/22/2020   BILITOT 1.6 (H) 08/22/2020   ALKPHOS 46 08/22/2020   AST 96 (H) 08/22/2020   ALT 24 08/22/2020   PROT 4.8 (L) 08/22/2020   ALBUMIN 2.4 (L) 08/22/2020   CALCIUM  7.4 (L) 08/22/2020   GFRAA >60 11/11/2017   QFTBGOLDPLUS NEGATIVE 04/09/2020    Speciality Comments: No specialty comments available.  Procedures:  No procedures performed Allergies: Sudafed [pseudoephedrine hcl]   Assessment / Plan:     Visit Diagnoses: Polymyalgia rheumatica (HCC)-in remission.  She had no muscular weakness or tenderness on the examination.  There was no temporal artery tenderness.  She ambulates with the help of a cane.  Primary osteoarthritis of both hands -bilateral PIP and DIP thickening with no synovitis was noted.  Right thumb mucinous cyst was removed in [place] .  Closed Colles' fracture of left radius, sequela - - Treated by Dr. Barbarann  Ganglion cyst was noted on the volar aspect of right wrist.  Surgery was advised by orthopedics here.  Patient plans to get a second opinion in [place].  Status post total hip replacement, left-she had good range of motion without discomfort.  History of total knee  replacement, bilateral-no warmth swelling or effusion was noted.  She has somewhat limited extension.  Need for regular exercise was emphasized.  Degeneration of intervertebral disc of lumbar region without discogenic back pain or lower extremity pain-she continues to have some lower back pain.  She had lumbar cortisone injection prior to going to [place] and [place] process.  Closed compression fracture of L2 vertebra, sequela - After a fall.  Osteopenia of multiple sites - April 29, 2020 T-score -1.2 right total hip.May 21, 2022 T-score -0.9 right total hip at [facility].  Calcium  rich diet and vitamin D  was emphasized.  Fibromyalgia -she continues to have some generalized pain.  She states her pain level overall has improved.  She describes pain on the scale of 0-10 about 4-5.  She is on gabapentin  300 mg p.o. twice daily.  She takes Tylenol  as needed.  Primary insomnia-her insomnia is improved since she has been sleeping better.  She is waking up due to nocturnal enuresis.  Her symptoms have improved on Gemtesa prescribed by her urologist.  History of restless legs syndrome  History of hypertension-blood pressure was normal today.  History of gastroesophageal reflux (GERD)  History of sleep apnea  Orders: No orders of the defined types were placed in this encounter.  No orders of the defined types were placed in this encounter.   Follow-Up Instructions: Return in about 6 months (around 03/23/2024) for PMR, OA.  Patient will be going to [place]  for the summer months and will be coming back in January.   Maya Nash, MD  Note - This record has been created using Animal nutritionist.  Chart creation errors have been sought, but may not always  have been located. Such creation errors do not reflect on  the standard of medical care.

## 2023-09-21 ENCOUNTER — Encounter: Payer: Self-pay | Admitting: Rheumatology

## 2023-09-21 ENCOUNTER — Ambulatory Visit: Payer: Medicare PPO | Attending: Rheumatology | Admitting: Rheumatology

## 2023-09-21 VITALS — BP 105/72 | HR 99 | Resp 16 | Ht 65.0 in | Wt 218.2 lb

## 2023-09-21 DIAGNOSIS — S32020S Wedge compression fracture of second lumbar vertebra, sequela: Secondary | ICD-10-CM

## 2023-09-21 DIAGNOSIS — M67439 Ganglion, unspecified wrist: Secondary | ICD-10-CM

## 2023-09-21 DIAGNOSIS — S52532S Colles' fracture of left radius, sequela: Secondary | ICD-10-CM

## 2023-09-21 DIAGNOSIS — H353131 Nonexudative age-related macular degeneration, bilateral, early dry stage: Secondary | ICD-10-CM | POA: Diagnosis not present

## 2023-09-21 DIAGNOSIS — M797 Fibromyalgia: Secondary | ICD-10-CM

## 2023-09-21 DIAGNOSIS — Z8679 Personal history of other diseases of the circulatory system: Secondary | ICD-10-CM

## 2023-09-21 DIAGNOSIS — M353 Polymyalgia rheumatica: Secondary | ICD-10-CM | POA: Diagnosis not present

## 2023-09-21 DIAGNOSIS — M8589 Other specified disorders of bone density and structure, multiple sites: Secondary | ICD-10-CM

## 2023-09-21 DIAGNOSIS — F5101 Primary insomnia: Secondary | ICD-10-CM

## 2023-09-21 DIAGNOSIS — M19042 Primary osteoarthritis, left hand: Secondary | ICD-10-CM

## 2023-09-21 DIAGNOSIS — Z8669 Personal history of other diseases of the nervous system and sense organs: Secondary | ICD-10-CM

## 2023-09-21 DIAGNOSIS — M19041 Primary osteoarthritis, right hand: Secondary | ICD-10-CM

## 2023-09-21 DIAGNOSIS — Z8719 Personal history of other diseases of the digestive system: Secondary | ICD-10-CM

## 2023-09-21 DIAGNOSIS — M51369 Other intervertebral disc degeneration, lumbar region without mention of lumbar back pain or lower extremity pain: Secondary | ICD-10-CM | POA: Diagnosis not present

## 2023-09-21 DIAGNOSIS — Z96653 Presence of artificial knee joint, bilateral: Secondary | ICD-10-CM

## 2023-09-21 DIAGNOSIS — Z96642 Presence of left artificial hip joint: Secondary | ICD-10-CM

## 2023-10-06 DIAGNOSIS — K8689 Other specified diseases of pancreas: Secondary | ICD-10-CM | POA: Diagnosis not present

## 2023-10-06 DIAGNOSIS — K760 Fatty (change of) liver, not elsewhere classified: Secondary | ICD-10-CM | POA: Diagnosis not present

## 2023-10-06 DIAGNOSIS — K869 Disease of pancreas, unspecified: Secondary | ICD-10-CM | POA: Diagnosis not present

## 2023-10-06 DIAGNOSIS — Z9889 Other specified postprocedural states: Secondary | ICD-10-CM | POA: Diagnosis not present

## 2023-10-06 DIAGNOSIS — K862 Cyst of pancreas: Secondary | ICD-10-CM | POA: Diagnosis not present

## 2023-10-11 ENCOUNTER — Other Ambulatory Visit: Payer: Self-pay | Admitting: Physician Assistant

## 2023-10-11 ENCOUNTER — Encounter: Payer: Self-pay | Admitting: Rheumatology

## 2023-10-12 NOTE — Telephone Encounter (Signed)
 Refill pending in Dr. Jammie box.

## 2023-10-12 NOTE — Telephone Encounter (Signed)
 Last Fill: 07/15/2023  Next Visit: 03/23/2024  Last Visit: 09/21/2023  Dx: Fibromyalgia   Current Dose per office note on 09/21/2023: gabapentin  300 mg p.o. twice daily   Okay to refill Gabapentin ?

## 2023-10-14 DIAGNOSIS — K863 Pseudocyst of pancreas: Secondary | ICD-10-CM | POA: Diagnosis not present

## 2023-10-14 DIAGNOSIS — K8689 Other specified diseases of pancreas: Secondary | ICD-10-CM | POA: Diagnosis not present

## 2023-10-14 DIAGNOSIS — K862 Cyst of pancreas: Secondary | ICD-10-CM | POA: Diagnosis not present

## 2023-10-19 DIAGNOSIS — M797 Fibromyalgia: Secondary | ICD-10-CM | POA: Diagnosis not present

## 2023-10-19 DIAGNOSIS — Z7982 Long term (current) use of aspirin: Secondary | ICD-10-CM | POA: Diagnosis not present

## 2023-10-19 DIAGNOSIS — Z79899 Other long term (current) drug therapy: Secondary | ICD-10-CM | POA: Diagnosis not present

## 2023-10-19 DIAGNOSIS — K869 Disease of pancreas, unspecified: Secondary | ICD-10-CM | POA: Diagnosis not present

## 2023-10-19 DIAGNOSIS — K219 Gastro-esophageal reflux disease without esophagitis: Secondary | ICD-10-CM | POA: Diagnosis not present

## 2023-10-19 DIAGNOSIS — I10 Essential (primary) hypertension: Secondary | ICD-10-CM | POA: Diagnosis not present

## 2023-10-19 DIAGNOSIS — K862 Cyst of pancreas: Secondary | ICD-10-CM | POA: Diagnosis not present

## 2023-10-19 DIAGNOSIS — G4733 Obstructive sleep apnea (adult) (pediatric): Secondary | ICD-10-CM | POA: Diagnosis not present

## 2023-11-08 DIAGNOSIS — M1811 Unilateral primary osteoarthritis of first carpometacarpal joint, right hand: Secondary | ICD-10-CM | POA: Diagnosis not present

## 2023-11-08 DIAGNOSIS — R52 Pain, unspecified: Secondary | ICD-10-CM | POA: Diagnosis not present

## 2023-11-08 DIAGNOSIS — M19031 Primary osteoarthritis, right wrist: Secondary | ICD-10-CM | POA: Diagnosis not present

## 2023-11-08 DIAGNOSIS — R2231 Localized swelling, mass and lump, right upper limb: Secondary | ICD-10-CM | POA: Diagnosis not present

## 2023-11-18 DIAGNOSIS — D490 Neoplasm of unspecified behavior of digestive system: Secondary | ICD-10-CM | POA: Diagnosis not present

## 2023-11-25 DIAGNOSIS — E7800 Pure hypercholesterolemia, unspecified: Secondary | ICD-10-CM | POA: Diagnosis not present

## 2023-11-25 DIAGNOSIS — Z23 Encounter for immunization: Secondary | ICD-10-CM | POA: Diagnosis not present

## 2023-11-25 DIAGNOSIS — I129 Hypertensive chronic kidney disease with stage 1 through stage 4 chronic kidney disease, or unspecified chronic kidney disease: Secondary | ICD-10-CM | POA: Diagnosis not present

## 2023-11-25 DIAGNOSIS — R7301 Impaired fasting glucose: Secondary | ICD-10-CM | POA: Diagnosis not present

## 2023-11-25 DIAGNOSIS — K219 Gastro-esophageal reflux disease without esophagitis: Secondary | ICD-10-CM | POA: Diagnosis not present

## 2023-11-25 DIAGNOSIS — N182 Chronic kidney disease, stage 2 (mild): Secondary | ICD-10-CM | POA: Diagnosis not present

## 2023-11-25 DIAGNOSIS — M810 Age-related osteoporosis without current pathological fracture: Secondary | ICD-10-CM | POA: Diagnosis not present

## 2023-11-25 DIAGNOSIS — Z96642 Presence of left artificial hip joint: Secondary | ICD-10-CM | POA: Diagnosis not present

## 2023-12-05 ENCOUNTER — Other Ambulatory Visit: Payer: Self-pay | Admitting: Rheumatology

## 2023-12-05 ENCOUNTER — Encounter: Payer: Self-pay | Admitting: Rheumatology

## 2024-01-19 DIAGNOSIS — M533 Sacrococcygeal disorders, not elsewhere classified: Secondary | ICD-10-CM | POA: Diagnosis not present

## 2024-01-19 DIAGNOSIS — M48061 Spinal stenosis, lumbar region without neurogenic claudication: Secondary | ICD-10-CM | POA: Diagnosis not present

## 2024-01-19 DIAGNOSIS — M47818 Spondylosis without myelopathy or radiculopathy, sacral and sacrococcygeal region: Secondary | ICD-10-CM | POA: Diagnosis not present

## 2024-01-19 DIAGNOSIS — M47816 Spondylosis without myelopathy or radiculopathy, lumbar region: Secondary | ICD-10-CM | POA: Diagnosis not present

## 2024-01-23 ENCOUNTER — Other Ambulatory Visit: Payer: Self-pay | Admitting: Rheumatology

## 2024-01-23 ENCOUNTER — Encounter: Payer: Self-pay | Admitting: Rheumatology

## 2024-01-24 NOTE — Telephone Encounter (Signed)
 Last Fill: 10/12/2023  Next Visit: 03/23/2024  Last Visit: 09/21/2023  DX: Fibromyalgia   Current Dose per office note on 09/21/2023: gabapentin  300 mg p.o. twice daily.   Okay to refill gabapentin ?

## 2024-01-24 NOTE — Telephone Encounter (Signed)
 Okay to refer to pain management.  I do not know who prescribes naltrexone.

## 2024-02-01 DIAGNOSIS — M47818 Spondylosis without myelopathy or radiculopathy, sacral and sacrococcygeal region: Secondary | ICD-10-CM | POA: Diagnosis not present

## 2024-02-01 DIAGNOSIS — M533 Sacrococcygeal disorders, not elsewhere classified: Secondary | ICD-10-CM | POA: Diagnosis not present

## 2024-03-13 NOTE — Progress Notes (Signed)
 "  Office Visit Note  Patient: Miranda  KAYANA Romero             Date of Birth: 1945/10/15           MRN: [id number]             PCP: Vernadine Charlie ORN, MD Referring: Vernadine Charlie ORN, MD Visit Date: 03/23/2024 Occupation: Data Unavailable  Subjective: generalized pain   History of Present Illness: Miranda  F Romero is a 78 y.o. female with fibromyalgia syndrome, osteoarthritis and degenerative disc disease.  She returns today after her last visit in July 2025.  She was in [place]  and came back last week.  She states she had a lot of discomfort with the colder temperatures.  She states the pain is all over her body.  She has been having increased pain and discomfort in her knee joints.  She states the morning stiffness lasts almost all day.  She is also experiencing nocturnal pain.  She remains on gabapentin  300 mg twice a day.  She also continues to be on naltrexone for pain management.  She takes Tylenol  on as needed basis.    Activities of Daily Living:  Patient reports morning stiffness for 24 hours.   Patient Reports nocturnal pain.  Difficulty dressing/grooming: Denies Difficulty climbing stairs: Reports Difficulty getting out of chair: Reports Difficulty using hands for taps, buttons, cutlery, and/or writing: Denies  Review of Systems  Constitutional:  Positive for fatigue.  HENT:  Negative for mouth sores and mouth dryness.   Eyes:  Negative for dryness.  Respiratory:  Negative for shortness of breath.   Cardiovascular:  Negative for chest pain and palpitations.  Gastrointestinal:  Negative for blood in stool, constipation and diarrhea.  Endocrine: Positive for increased urination.  Genitourinary:  Negative for involuntary urination.  Musculoskeletal:  Positive for joint pain, gait problem, joint pain, joint swelling, myalgias, muscle weakness, morning stiffness, muscle tenderness and myalgias.  Skin:  Positive for sensitivity to sunlight. Negative for color change, rash and  hair loss.  Allergic/Immunologic: Negative for susceptible to infections.  Neurological:  Negative for dizziness and headaches.  Hematological:  Negative for swollen glands.  Psychiatric/Behavioral:  Negative for depressed mood and sleep disturbance. The patient is not nervous/anxious.     PMFS History:  Patient Active Problem List   Diagnosis Date Noted   Lumbar compression fracture (HCC) 06/19/2020   Colles' fracture of left radius, initial encounter for closed fracture 05/09/2020   Osteoarthritis of left hip 11/09/2017   S/P hip replacement, left 11/09/2017   Primary osteoarthritis of both hips 09/25/2016   Fibromyalgia 04/28/2016   Primary osteoarthritis of both knees 04/28/2016   History of total knee replacement, bilateral 04/28/2016   Primary osteoarthritis of both hands 04/28/2016   Primary insomnia 04/28/2016   History of sleep apnea 04/28/2016   History of restless legs syndrome 04/28/2016   Osteopenia of multiple sites 04/28/2016   Trochanteric bursitis of left hip 02/03/2016   Hiatal hernia with gastroesophageal reflux 11/06/2015   Hiatal hernia 03/25/2015   Gastroesophageal reflux disease with esophagitis 01/17/2015   Ligamentous laxity of knee 01/05/2011   Knee effusion, right 11/10/2010   Knee pain 11/10/2010   Incisional hernia 10/16/2010   Essential hypertension 05/14/2010   Sleep apnea 05/14/2010   Obesity 04/08/2009    Past Medical History:  Diagnosis Date   Arthritis    Blood transfusion    Compression fracture of L3 vertebra (HCC)    Fibromyalgia    Dr Dolphus  Ganglion cyst of wrist 07/2023   Right wrist   GERD (gastroesophageal reflux disease)    Hiatal hernia with gastroesophageal reflux 11/06/2015   History of hiatal hernia    Hyperlipidemia    Hypertension    Dr Vernadine LOV 4/13 on chart   Seasonal allergies    Sleep apnea    SEVERE  per sleep study 3/12 EPIC no CPAP    Transfusion history    3 yrs ago s/p surgery for knee    Umbilical hernia    Weight decrease    Wrist fracture    Left wrist    Family History  Problem Relation Age of Onset   Lymphoma Mother    Past Surgical History:  Procedure Laterality Date   ANKLE SURGERY     APPENDECTOMY  2014   BALLOON DILATION N/A 06/05/2015   Procedure: BALLOON DILATION;  Surgeon: Elsie Cree, MD;  Location: [facility];  Service: Endoscopy;  Laterality: N/A;   CATARACT EXTRACTION Bilateral 05/2013   CHOLECYSTECTOMY     DILATION AND CURETTAGE OF UTERUS     ESOPHAGOGASTRODUODENOSCOPY (EGD) WITH PROPOFOL  N/A 06/05/2015   Procedure: ESOPHAGOGASTRODUODENOSCOPY (EGD) WITH PROPOFOL ;  Surgeon: Elsie Cree, MD;  Location: [facility];  Service: Endoscopy;  Laterality: N/A;   HERNIA REPAIR  05/2013   due to appendectomy with mesh   HIATAL HERNIA REPAIR N/A 11/06/2015   Procedure: LAPAROSCOPIC REPAIR HIATAL HERNIA, Nissen FUNDOPLICATION;  Surgeon: Elon Pacini, MD;  Location: [facility];  Service: General;  Laterality: N/A;   JOINT REPLACEMENT     KNEE ARTHROPLASTY Bilateral    knee infection Right    knee hardware removed 11/12/   replaced 1/13   spine injection     [facility]   thumb surgery Right    TOTAL HIP ARTHROPLASTY Left 11/09/2017   Procedure: LEFT TOTAL HIP ARTHROPLASTY;  Surgeon: Anderson Maude ORN, MD;  Location: [facility];  Service: Orthopedics;  Laterality: Left;   VENTRAL HERNIA REPAIR  07/21/2011   Procedure: LAPAROSCOPIC VENTRAL HERNIA;  Surgeon: Elon CHRISTELLA Pacini, MD;  Location: [facility];  Service: General;  Laterality: N/A;  Laparoscopic Repair Verntal Incisional Hernia and Mesh   Social History[1] Social History   Social History Narrative   Lives at home with her husband   They have second home in [place]  where they spend a lot of time   Right handed   Caffeine: 2 cups daily     Immunization History  Administered Date(s) Administered   INFLUENZA, HIGH DOSE SEASONAL PF 01/05/2017, 12/09/2017   Influenza Whole 11/18/2009    Influenza-Unspecified 11/29/2010, 01/18/2015, 11/30/2015   PFIZER(Purple Top)SARS-COV-2 Vaccination 04/22/2019, 05/17/2019, 11/18/2019, 07/25/2020   Pfizer Covid-19 Vaccine Bivalent Booster 74yrs & up 12/09/2020   Pfizer(Comirnaty)Fall Seasonal Vaccine 12 years and older 11/25/2023   Pneumococcal Polysaccharide-23 11/29/2007, 04/16/2008   Tdap 04/21/2018   Zoster Recombinant(Shingrix) 10/18/2017     Objective: Vital Signs: BP 125/80 (BP Location: Left Arm, Patient Position: Sitting, Cuff Size: Normal)   Pulse 82   Temp 97.7 F (36.5 C)   Resp 17   Ht 5' 5 (1.651 m)   Wt 224 lb (101.6 kg)   LMP 09/20/2000   BMI 37.28 kg/m    Physical Exam Vitals and nursing note reviewed.  Constitutional:      Appearance: She is well-developed.  HENT:     Head: Normocephalic and atraumatic.  Eyes:     Conjunctiva/sclera: Conjunctivae normal.  Cardiovascular:     Rate and Rhythm: Normal rate and regular  rhythm.     Heart sounds: Normal heart sounds.  Pulmonary:     Effort: Pulmonary effort is normal.     Breath sounds: Normal breath sounds.  Abdominal:     General: Bowel sounds are normal.     Palpations: Abdomen is soft.  Musculoskeletal:     Cervical back: Normal range of motion.     Right lower leg: Edema present.     Left lower leg: Edema present.  Lymphadenopathy:     Cervical: No cervical adenopathy.  Skin:    General: Skin is warm and dry.     Capillary Refill: Capillary refill takes less than 2 seconds.  Neurological:     Mental Status: She is alert and oriented to person, place, and time.  Psychiatric:        Behavior: Behavior normal.      Musculoskeletal Exam: She could range of motion of the cervical spine.  There was no tenderness on thoracic or lumbar spine.  There was no SI joint tenderness.  Shoulder joints, elbow joints, wrist joints, MCPs, PIPs and DIPs were in good range of motion with no synovitis.  Hip joints and knee joints were in good range of motion  without any warmth swelling or effusion.  Her left hip and both knees are replaced.  There was no tenderness over ankles or MTPs.   CDAI Exam: CDAI Score: -- Patient Global: --; Provider Global: -- Swollen: --; Tender: -- Joint Exam 03/23/2024   No joint exam has been documented for this visit   There is currently no information documented on the homunculus. Go to the Rheumatology activity and complete the homunculus joint exam.  Investigation: No additional findings.  Imaging: No results found.  Recent Labs: Lab Results  Component Value Date   WBC 22.0 (H) 08/22/2020   HGB 17.8 (H) 08/22/2020   PLT 401 (H) 08/22/2020   NA 138 08/22/2020   K 5.1 08/22/2020   CL 107 08/22/2020   CO2 21 (L) 08/22/2020   GLUCOSE 146 (H) 08/22/2020   BUN 24 (H) 08/22/2020   CREATININE 0.86 08/22/2020   BILITOT 1.6 (H) 08/22/2020   ALKPHOS 46 08/22/2020   AST 96 (H) 08/22/2020   ALT 24 08/22/2020   PROT 4.8 (L) 08/22/2020   ALBUMIN 2.4 (L) 08/22/2020   CALCIUM  7.4 (L) 08/22/2020   GFRAA >60 11/11/2017   QFTBGOLDPLUS NEGATIVE 04/09/2020    Speciality Comments: No specialty comments available.  Procedures:  No procedures performed Allergies: Pseudoephedrine hcl and Sudafed [pseudoephedrine hcl]   Assessment / Plan:     Visit Diagnoses: Polymyalgia rheumatica-in remission.  Patient had no increased muscular weakness or tenderness.  Primary osteoarthritis of both hands - Right thumb mucinous cyst was removed in [place] .  No significant PIP or DIP thickening was noted.  No synovitis was noted.  Closed Colles' fracture of left radius, sequela - Treated by Dr. Barbarann  Status post total hip replacement, left-she had good range of motion without discomfort.  History of total knee replacement, bilateral-feet good range of motion without discomfort.  Degeneration of intervertebral disc of lumbar region without discogenic back pain or lower extremity pain -she denies discomfort today.  However  she has frequent discomfort in her lower back.  Closed compression fracture of L2 vertebra, sequela - After a fall.  Osteopenia of multiple sites - April 29, 2020 T-score -1.2 right total hip.May 21, 2022 T-score -0.9 right total hip at [facility].  Fibromyalgia -she continues to have generalized  pain and discomfort.  She is on gabapentin  300 mg p.o. twice daily.  She also uses Tylenol  and naltrexone.  Primary insomnia-patient states since she has changed her medication to Gemtesa she has less frequent nocturnal enuresis which is helpful..  Pedal edema-she has significant pedal edema in the bilateral lower extremities.  She recently came from [place] .  Use of compression socks was emphasized.  Regular walking was discussed.  History of hypertension-blood pressure was normal at 125/80.  History of restless legs syndrome  History of gastroesophageal reflux (GERD)  History of sleep apnea  Orders: No orders of the defined types were placed in this encounter.  No orders of the defined types were placed in this encounter.    Follow-Up Instructions: Return in about 6 months (around 09/20/2024) for Osteoarthritis, FMS.   Maya Nash, MD  Note - This record has been created using Animal nutritionist.  Chart creation errors have been sought, but may not always  have been located. Such creation errors do not reflect on  the standard of medical care.     [1]  Social History Tobacco Use   Smoking status: Never    Passive exposure: Past   Smokeless tobacco: Never  Vaping Use   Vaping status: Never Used  Substance Use Topics   Alcohol  use: Yes    Comment: Socially   Drug use: No   "

## 2024-03-23 ENCOUNTER — Encounter: Payer: Self-pay | Admitting: Rheumatology

## 2024-03-23 ENCOUNTER — Ambulatory Visit: Attending: Rheumatology | Admitting: Rheumatology

## 2024-03-23 VITALS — BP 125/80 | HR 82 | Temp 97.7°F | Resp 17 | Ht 65.0 in | Wt 224.0 lb

## 2024-03-23 DIAGNOSIS — Z8719 Personal history of other diseases of the digestive system: Secondary | ICD-10-CM

## 2024-03-23 DIAGNOSIS — S52532S Colles' fracture of left radius, sequela: Secondary | ICD-10-CM | POA: Diagnosis not present

## 2024-03-23 DIAGNOSIS — M353 Polymyalgia rheumatica: Secondary | ICD-10-CM

## 2024-03-23 DIAGNOSIS — Z8669 Personal history of other diseases of the nervous system and sense organs: Secondary | ICD-10-CM

## 2024-03-23 DIAGNOSIS — Z8679 Personal history of other diseases of the circulatory system: Secondary | ICD-10-CM

## 2024-03-23 DIAGNOSIS — M51369 Other intervertebral disc degeneration, lumbar region without mention of lumbar back pain or lower extremity pain: Secondary | ICD-10-CM

## 2024-03-23 DIAGNOSIS — Z96642 Presence of left artificial hip joint: Secondary | ICD-10-CM

## 2024-03-23 DIAGNOSIS — Z96653 Presence of artificial knee joint, bilateral: Secondary | ICD-10-CM

## 2024-03-23 DIAGNOSIS — M797 Fibromyalgia: Secondary | ICD-10-CM | POA: Diagnosis not present

## 2024-03-23 DIAGNOSIS — S32020S Wedge compression fracture of second lumbar vertebra, sequela: Secondary | ICD-10-CM

## 2024-03-23 DIAGNOSIS — R600 Localized edema: Secondary | ICD-10-CM | POA: Diagnosis not present

## 2024-03-23 DIAGNOSIS — F5101 Primary insomnia: Secondary | ICD-10-CM

## 2024-03-23 DIAGNOSIS — M19041 Primary osteoarthritis, right hand: Secondary | ICD-10-CM

## 2024-03-23 DIAGNOSIS — M19042 Primary osteoarthritis, left hand: Secondary | ICD-10-CM

## 2024-03-23 DIAGNOSIS — M8589 Other specified disorders of bone density and structure, multiple sites: Secondary | ICD-10-CM

## 2024-03-23 DIAGNOSIS — M67439 Ganglion, unspecified wrist: Secondary | ICD-10-CM

## 2024-03-25 ENCOUNTER — Other Ambulatory Visit: Payer: Self-pay | Admitting: Rheumatology

## 2024-03-25 ENCOUNTER — Encounter: Payer: Self-pay | Admitting: Rheumatology

## 2024-04-12 ENCOUNTER — Other Ambulatory Visit: Payer: Self-pay | Admitting: Rheumatology

## 2024-04-12 MED ORDER — GABAPENTIN 300 MG PO CAPS: 300.0000 mg | ORAL_CAPSULE | Freq: Two times a day (BID) | ORAL | 0 refills | Status: AC

## 2024-04-12 NOTE — Telephone Encounter (Signed)
 Last Fill: 01/24/2024  Next Visit: 09/18/2024  Last Visit: 03/23/2024  DX: Fibromyalgia   Current Dose per office note 03/23/2024: gabapentin  300 mg p.o. twice daily.   Okay to refill gabapentin ?

## 2024-09-18 ENCOUNTER — Ambulatory Visit: Admitting: Rheumatology
